# Patient Record
Sex: Female | Born: 1935 | Race: Black or African American | Hispanic: No | Marital: Single | Smoking: Never smoker
Health system: Southern US, Community
[De-identification: ages and names within clinical notes are randomized; demographics above are authoritative.]

## PROBLEM LIST (undated history)

## (undated) DIAGNOSIS — I10 Essential (primary) hypertension: Secondary | ICD-10-CM

## (undated) DIAGNOSIS — F0390 Unspecified dementia without behavioral disturbance: Secondary | ICD-10-CM

## (undated) DIAGNOSIS — E119 Type 2 diabetes mellitus without complications: Secondary | ICD-10-CM

## (undated) HISTORY — PX: ABDOMINAL HYSTERECTOMY: SHX81

## (undated) HISTORY — PX: SHOULDER SURGERY: SHX246

---

## 2005-11-15 ENCOUNTER — Ambulatory Visit: Payer: Self-pay | Admitting: Family Medicine

## 2006-05-19 ENCOUNTER — Emergency Department: Payer: Self-pay | Admitting: Emergency Medicine

## 2007-08-13 ENCOUNTER — Emergency Department: Payer: Self-pay | Admitting: Emergency Medicine

## 2007-08-19 ENCOUNTER — Other Ambulatory Visit: Payer: Self-pay

## 2007-08-19 ENCOUNTER — Emergency Department: Payer: Self-pay | Admitting: Emergency Medicine

## 2008-09-16 ENCOUNTER — Inpatient Hospital Stay: Payer: Self-pay | Admitting: Internal Medicine

## 2009-07-10 ENCOUNTER — Ambulatory Visit: Payer: Self-pay | Admitting: Family Medicine

## 2010-10-23 ENCOUNTER — Encounter: Payer: Self-pay | Admitting: Podiatry

## 2010-10-23 DIAGNOSIS — M1990 Unspecified osteoarthritis, unspecified site: Secondary | ICD-10-CM

## 2010-10-23 DIAGNOSIS — I10 Essential (primary) hypertension: Secondary | ICD-10-CM | POA: Insufficient documentation

## 2011-07-08 ENCOUNTER — Ambulatory Visit: Payer: Self-pay | Admitting: Vascular Surgery

## 2011-07-08 LAB — BASIC METABOLIC PANEL
Calcium, Total: 9.2 mg/dL (ref 8.5–10.1)
Chloride: 103 mmol/L (ref 98–107)
Co2: 27 mmol/L (ref 21–32)
Creatinine: 0.82 mg/dL (ref 0.60–1.30)
EGFR (Non-African Amer.): >60
Potassium: 3.8 mmol/L (ref 3.5–5.1)
Sodium: 138 mmol/L (ref 136–145)

## 2011-11-08 ENCOUNTER — Ambulatory Visit: Payer: Self-pay | Admitting: Vascular Surgery

## 2011-11-08 LAB — BASIC METABOLIC PANEL
Anion Gap: 9 (ref 7–16)
BUN: 12 mg/dL (ref 7–18)
Chloride: 102 mmol/L (ref 98–107)
Creatinine: 0.81 mg/dL (ref 0.60–1.30)
EGFR (African American): >60
EGFR (Non-African Amer.): >60
Glucose: 113 mg/dL — ABNORMAL HIGH (ref 65–99)
Potassium: 4.2 mmol/L (ref 3.5–5.1)
Sodium: 140 mmol/L (ref 136–145)

## 2011-12-27 ENCOUNTER — Ambulatory Visit: Payer: Self-pay | Admitting: Family Medicine

## 2012-02-07 ENCOUNTER — Ambulatory Visit: Payer: Self-pay | Admitting: Specialist

## 2012-05-03 ENCOUNTER — Ambulatory Visit: Payer: Self-pay | Admitting: Specialist

## 2012-05-03 LAB — BASIC METABOLIC PANEL
Anion Gap: 4 — ABNORMAL LOW (ref 7–16)
Calcium, Total: 9 mg/dL (ref 8.5–10.1)
Co2: 29 mmol/L (ref 21–32)
Creatinine: 0.83 mg/dL (ref 0.60–1.30)
Glucose: 96 mg/dL (ref 65–99)
Osmolality: 277 (ref 275–301)
Potassium: 3.8 mmol/L (ref 3.5–5.1)
Sodium: 138 mmol/L (ref 136–145)

## 2012-05-03 LAB — CBC WITH DIFFERENTIAL/PLATELET
Basophil %: 1.3 %
Eosinophil #: 0.1 10*3/uL (ref 0.0–0.7)
HCT: 33.6 % — ABNORMAL LOW (ref 35.0–47.0)
HGB: 10.8 g/dL — ABNORMAL LOW (ref 12.0–16.0)
Lymphocyte #: 2 10*3/uL (ref 1.0–3.6)
MCH: 24.5 pg — ABNORMAL LOW (ref 26.0–34.0)
MCHC: 32 g/dL (ref 32.0–36.0)
Monocyte #: 0.7 x10 3/mm (ref 0.2–0.9)
Neutrophil %: 47.4 %
Platelet: 208 10*3/uL (ref 150–440)
RBC: 4.39 10*6/uL (ref 3.80–5.20)
RDW: 14.6 % — ABNORMAL HIGH (ref 11.5–14.5)
WBC: 5.5 10*3/uL (ref 3.6–11.0)

## 2012-05-09 ENCOUNTER — Ambulatory Visit: Payer: Self-pay | Admitting: Internal Medicine

## 2012-05-10 ENCOUNTER — Ambulatory Visit: Payer: Self-pay | Admitting: Specialist

## 2012-12-19 ENCOUNTER — Ambulatory Visit: Payer: Self-pay | Admitting: Family Medicine

## 2013-01-22 ENCOUNTER — Ambulatory Visit: Payer: Self-pay | Admitting: Vascular Surgery

## 2013-01-22 LAB — BASIC METABOLIC PANEL
BUN: 25 mg/dL — ABNORMAL HIGH (ref 7–18)
Chloride: 99 mmol/L (ref 98–107)
Co2: 31 mmol/L (ref 21–32)
EGFR (African American): >60
Glucose: 117 mg/dL — ABNORMAL HIGH (ref 65–99)
Osmolality: 276 (ref 275–301)
Sodium: 135 mmol/L — ABNORMAL LOW (ref 136–145)

## 2013-02-25 ENCOUNTER — Inpatient Hospital Stay: Payer: Self-pay | Admitting: Student

## 2013-02-25 LAB — COMPREHENSIVE METABOLIC PANEL
Albumin: 4.1 g/dL (ref 3.4–5.0)
Anion Gap: 5 — ABNORMAL LOW (ref 7–16)
Bilirubin,Total: 0.6 mg/dL (ref 0.2–1.0)
Calcium, Total: 9.3 mg/dL (ref 8.5–10.1)
Chloride: 100 mmol/L (ref 98–107)
Co2: 30 mmol/L (ref 21–32)
EGFR (African American): 50 — ABNORMAL LOW
EGFR (Non-African Amer.): 43 — ABNORMAL LOW
Glucose: 150 mg/dL — ABNORMAL HIGH (ref 65–99)
Potassium: 3.2 mmol/L — ABNORMAL LOW (ref 3.5–5.1)
SGOT(AST): 25 U/L (ref 15–37)
Sodium: 135 mmol/L — ABNORMAL LOW (ref 136–145)
Total Protein: 7.9 g/dL (ref 6.4–8.2)

## 2013-02-25 LAB — URINALYSIS, COMPLETE
Glucose,UR: 150 mg/dL (ref 0–75)
Hyaline Cast: 2
Ketone: NEGATIVE
Nitrite: POSITIVE
Ph: 5 (ref 4.5–8.0)
Protein: 30
RBC,UR: 5 /HPF (ref 0–5)
Squamous Epithelial: <1

## 2013-02-25 LAB — CK TOTAL AND CKMB (NOT AT ARMC): CK-MB: 2.1 ng/mL (ref 0.5–3.6)

## 2013-02-25 LAB — CBC
HCT: 34.1 % — ABNORMAL LOW (ref 35.0–47.0)
HGB: 11.2 g/dL — ABNORMAL LOW (ref 12.0–16.0)
MCH: 24.6 pg — ABNORMAL LOW (ref 26.0–34.0)
RDW: 14.5 % (ref 11.5–14.5)
WBC: 5.4 10*3/uL (ref 3.6–11.0)

## 2013-02-25 LAB — TROPONIN I: Troponin-I: <0.02 ng/mL

## 2013-02-26 LAB — BASIC METABOLIC PANEL
Anion Gap: 7 (ref 7–16)
BUN: 13 mg/dL (ref 7–18)
Chloride: 102 mmol/L (ref 98–107)
EGFR (African American): >60
EGFR (Non-African Amer.): 57 — ABNORMAL LOW
Glucose: 113 mg/dL — ABNORMAL HIGH (ref 65–99)
Osmolality: 271 (ref 275–301)
Potassium: 3.4 mmol/L — ABNORMAL LOW (ref 3.5–5.1)
Sodium: 135 mmol/L — ABNORMAL LOW (ref 136–145)

## 2013-02-26 LAB — CBC WITH DIFFERENTIAL/PLATELET
Basophil %: 1.3 %
Eosinophil #: 0 10*3/uL (ref 0.0–0.7)
Eosinophil %: 0.6 %
HGB: 10.9 g/dL — ABNORMAL LOW (ref 12.0–16.0)
Lymphocyte #: 1.9 10*3/uL (ref 1.0–3.6)
Lymphocyte %: 33.3 %
MCH: 24.6 pg — ABNORMAL LOW (ref 26.0–34.0)
Neutrophil #: 2.9 10*3/uL (ref 1.4–6.5)
Neutrophil %: 51.3 %
Platelet: 199 10*3/uL (ref 150–440)
RDW: 14.8 % — ABNORMAL HIGH (ref 11.5–14.5)

## 2013-02-26 LAB — MAGNESIUM: Magnesium: 1.3 mg/dL — ABNORMAL LOW

## 2013-04-29 ENCOUNTER — Emergency Department (HOSPITAL_COMMUNITY): Payer: Medicare Other

## 2013-04-29 ENCOUNTER — Emergency Department (HOSPITAL_COMMUNITY)
Admission: EM | Admit: 2013-04-29 | Discharge: 2013-04-29 | Disposition: A | Discharge status: Nursing Home (Includes ICF) | Payer: Medicare Other | Source: Home / Self Care | Attending: Family Medicine | Admitting: Family Medicine

## 2013-04-29 ENCOUNTER — Emergency Department (HOSPITAL_COMMUNITY)
Admission: EM | Admit: 2013-04-29 | Discharge: 2013-04-29 | Disposition: A | Discharge status: Home / Self Care | Payer: Medicare Other | Attending: Emergency Medicine | Admitting: Emergency Medicine

## 2013-04-29 ENCOUNTER — Encounter (HOSPITAL_COMMUNITY): Payer: Self-pay | Admitting: Emergency Medicine

## 2013-04-29 DIAGNOSIS — I10 Essential (primary) hypertension: Secondary | ICD-10-CM | POA: Insufficient documentation

## 2013-04-29 DIAGNOSIS — E119 Type 2 diabetes mellitus without complications: Secondary | ICD-10-CM | POA: Insufficient documentation

## 2013-04-29 DIAGNOSIS — F0390 Unspecified dementia without behavioral disturbance: Secondary | ICD-10-CM | POA: Insufficient documentation

## 2013-04-29 DIAGNOSIS — R5381 Other malaise: Secondary | ICD-10-CM

## 2013-04-29 DIAGNOSIS — F29 Unspecified psychosis not due to a substance or known physiological condition: Secondary | ICD-10-CM

## 2013-04-29 DIAGNOSIS — R410 Disorientation, unspecified: Secondary | ICD-10-CM

## 2013-04-29 DIAGNOSIS — E876 Hypokalemia: Secondary | ICD-10-CM | POA: Insufficient documentation

## 2013-04-29 DIAGNOSIS — R531 Weakness: Secondary | ICD-10-CM

## 2013-04-29 DIAGNOSIS — R4182 Altered mental status, unspecified: Secondary | ICD-10-CM | POA: Insufficient documentation

## 2013-04-29 DIAGNOSIS — R197 Diarrhea, unspecified: Secondary | ICD-10-CM | POA: Insufficient documentation

## 2013-04-29 DIAGNOSIS — Z79899 Other long term (current) drug therapy: Secondary | ICD-10-CM | POA: Insufficient documentation

## 2013-04-29 DIAGNOSIS — Z7901 Long term (current) use of anticoagulants: Secondary | ICD-10-CM | POA: Insufficient documentation

## 2013-04-29 HISTORY — DX: Essential (primary) hypertension: I10

## 2013-04-29 HISTORY — DX: Unspecified dementia, unspecified severity, without behavioral disturbance, psychotic disturbance, mood disturbance, and anxiety: F03.90

## 2013-04-29 HISTORY — DX: Type 2 diabetes mellitus without complications: E11.9

## 2013-04-29 LAB — COMPREHENSIVE METABOLIC PANEL
AST: 23 U/L (ref 0–37)
Albumin: 4.2 g/dL (ref 3.5–5.2)
Alkaline Phosphatase: 62 U/L (ref 39–117)
BUN: 20 mg/dL (ref 6–23)
CO2: 29 mEq/L (ref 19–32)
Calcium: 9.7 mg/dL (ref 8.4–10.5)
Chloride: 93 mEq/L — ABNORMAL LOW (ref 96–112)
Creatinine, Ser: 1.23 mg/dL — ABNORMAL HIGH (ref 0.50–1.10)
GFR calc Af Amer: 46 mL/min — ABNORMAL LOW (ref >90)
GFR calc non Af Amer: 40 mL/min — ABNORMAL LOW (ref >90)
Total Bilirubin: 0.4 mg/dL (ref 0.3–1.2)
Total Protein: 7.9 g/dL (ref 6.0–8.3)

## 2013-04-29 LAB — CBC
HCT: 34.5 % — ABNORMAL LOW (ref 36.0–46.0)
MCH: 24.9 pg — ABNORMAL LOW (ref 26.0–34.0)
MCHC: 33 g/dL (ref 30.0–36.0)
MCV: 75.5 fL — ABNORMAL LOW (ref 78.0–100.0)
Platelets: 255 10*3/uL (ref 150–400)
RBC: 4.57 MIL/uL (ref 3.87–5.11)
RDW: 13.8 % (ref 11.5–15.5)
WBC: 6.5 10*3/uL (ref 4.0–10.5)

## 2013-04-29 LAB — URINALYSIS, ROUTINE W REFLEX MICROSCOPIC
Glucose, UA: NEGATIVE mg/dL
Ketones, ur: 15 mg/dL — AB
Leukocytes, UA: NEGATIVE
Nitrite: NEGATIVE
Protein, ur: 100 mg/dL — AB
Specific Gravity, Urine: 1.018 (ref 1.005–1.030)
Urobilinogen, UA: 1 mg/dL (ref 0.0–1.0)

## 2013-04-29 LAB — POCT URINALYSIS DIP (DEVICE)
Nitrite: NEGATIVE
Specific Gravity, Urine: 1.025 (ref 1.005–1.030)
Urobilinogen, UA: 2 mg/dL — ABNORMAL HIGH (ref 0.0–1.0)
pH: 6.5 (ref 5.0–8.0)

## 2013-04-29 LAB — URINE MICROSCOPIC-ADD ON

## 2013-04-29 MED ORDER — POTASSIUM CHLORIDE CRYS ER 20 MEQ PO TBCR
40.0000 meq | EXTENDED_RELEASE_TABLET | Freq: Once | ORAL | Status: AC
  Administered 2013-04-29: 40 meq via ORAL
  Filled 2013-04-29: qty 2

## 2013-04-29 MED ORDER — SODIUM CHLORIDE 0.9 % IV BOLUS (SEPSIS)
1000.0000 mL | Freq: Once | INTRAVENOUS | Status: AC
  Administered 2013-04-29: 1000 mL via INTRAVENOUS

## 2013-04-29 MED ORDER — LORAZEPAM 2 MG/ML IJ SOLN
1.0000 mg | Freq: Once | INTRAMUSCULAR | Status: AC
  Administered 2013-04-29: 1 mg via INTRAVENOUS
  Filled 2013-04-29: qty 1

## 2013-04-29 MED ORDER — POTASSIUM CHLORIDE 10 MEQ/100ML IV SOLN
10.0000 meq | Freq: Once | INTRAVENOUS | Status: AC
  Administered 2013-04-29: 10 meq via INTRAVENOUS
  Filled 2013-04-29: qty 100

## 2013-04-29 NOTE — ED Provider Notes (Signed)
CSN: [id number]     Arrival date & time 04/29/13  1004 History   First MD Initiated Contact with Patient 04/29/13 1037     Chief Complaint  Patient presents with  . Urinary Tract Infection   ) Patient is a 79 y.o. female presenting with altered mental status. The history is provided by a relative and the patient.  Altered Mental Status Presenting symptoms: behavior changes, confusion, disorientation, lethargy and memory loss   Presenting symptoms: no combativeness, no partial responsiveness and no unresponsiveness   Severity:  Moderate Most recent episode:  Today Episode history:  Continuous Duration:  3 days Timing:  Constant Progression:  Worsening Chronicity:  New Context: dementia   Context: not a recent illness and not a recent infection   Associated symptoms: decreased appetite and weakness   Associated symptoms: no agitation and no hallucinations   Ms Ochsner is and 79 y/o AA female w/ past medical h/o HTN and arthritis brought in by daughter and grand daughter for increasing weakness and confusion over the last 3 days. They report h/o similar symptoms back in September w/ UTI. Pt has slept a lot since onset of symptoms in fact did not get OOB yesterday at all. She is unsteady on her feet and has not been eating or drinking. No known frequency or c/o pain w/ urination. Pt normally has a great appetite and is oriented x 3 and still managing her own affairs until this past Friday (though family does report a slow decline since illness in September). Family also reports a recent diagnosis of early dementia (by PCP) though they report they have never seen symptoms. Pt was working and driving until recently.  In and out cathed for u/a which does not indicate UTI. On exam she has a blunted affect and is oriented x 2 but very slow to respond. She at times will follow simple commands but at other times does not. Otherwise no other focal findings. Felt pt would benefit from further work-up for  weakness and confusion. Will transfer to [facility] via shuttle.   Past Medical History  Diagnosis Date  . Diabetes mellitus without complication   . Hypertension   . Dementia    History reviewed. No pertinent past surgical history. No family history on file. History  Substance Use Topics  . Smoking status: Never Smoker   . Smokeless tobacco: Not on file  . Alcohol Use: No   OB History   Grav Para Term Preterm Abortions TAB SAB Ect Mult Living                 Review of Systems  Constitutional: Positive for activity change, appetite change and decreased appetite. Negative for chills.  HENT: Negative.   Eyes: Negative.   Respiratory: Negative for cough.   Cardiovascular: Negative.   Gastrointestinal: Negative.   Endocrine: Negative.   Genitourinary: Negative.   Musculoskeletal: Negative.   Skin: Negative.   Allergic/Immunologic: Negative.   Neurological: Positive for weakness. Negative for facial asymmetry and speech difficulty.  Hematological: Negative.   Psychiatric/Behavioral: Positive for memory loss and confusion. Negative for hallucinations and agitation.    Allergies  Review of patient's allergies indicates no known allergies.  Home Medications   Current Outpatient Rx  Name  Route  Sig  Dispense  Refill  . amLODipine (NORVASC) 5 MG tablet   Oral   Take 5 mg by mouth daily.         . chlorthalidone (HYGROTON) 25 MG tablet   Oral  Take 25 mg by mouth daily.         . clopidogrel (PLAVIX) 75 MG tablet   Oral   Take 75 mg by mouth daily with breakfast.         . donepezil (ARICEPT) 10 MG tablet   Oral   Take 10 mg by mouth at bedtime.         . gabapentin (NEURONTIN) 400 MG capsule   Oral   Take 400 mg by mouth 3 (three) times daily.         Marland Kitchen glimepiride (AMARYL) 2 MG tablet   Oral   Take 2 mg by mouth daily with breakfast.         . lisinopril (PRINIVIL,ZESTRIL) 20 MG tablet   Oral   Take 20 mg by mouth daily.         . meloxicam  (MOBIC) 15 MG tablet   Oral   Take 15 mg by mouth daily.         Marland Kitchen omeprazole (PRILOSEC) 40 MG capsule   Oral   Take 40 mg by mouth daily.         . rosuvastatin (CRESTOR) 40 MG tablet   Oral   Take 40 mg by mouth daily.          BP 125/73  Pulse 77  Temp(Src) 97.3 F (36.3 C) (Oral)  Resp 16  SpO2 100% Physical Exam  Constitutional: She appears well-developed and well-nourished.  HENT:  Head: Normocephalic and atraumatic.  Eyes: Conjunctivae are normal. Pupils are equal, round, and reactive to light.  Unable to fuuly assess neuro d/t pt unable to follow commands at times.  Cardiovascular: Normal rate and regular rhythm.   Pulmonary/Chest: Effort normal and breath sounds normal.  Musculoskeletal: Normal range of motion.  Neurological: She is alert. GCS eye subscore is 4. GCS verbal subscore is 5. GCS motor subscore is 6.  Oriented x 2 (person and DOB)    ED Course  Procedures (including critical care time) Labs Review Labs Reviewed  POCT URINALYSIS DIP (DEVICE) - Abnormal; Notable for the following:    Bilirubin Urine SMALL (*)    Hgb urine dipstick TRACE (*)    Protein, ur 100 (*)    Urobilinogen, UA 2.0 (*)    All other components within normal limits   Imaging Review No results found.  EKG Interpretation    Date/Time:    Ventricular Rate:    PR Interval:    QRS Duration:   QT Interval:    QTC Calculation:   R Axis:     Text Interpretation:              MDM   1. Weakness   2. Confusion    3 day h/o weakness and confusion. Similar symptoms in 01/2013 w/ UTI.  In and out cathed for u/a which does not indicate UTI. On exam she has a blunted affect and is oriented x 2 but very slow to respond. She at times will follow simple commands but at other times does not. Otherwise no other focal findings. Pt usually quite independent and was working and driving until recently.  Felt pt would benefit from further work-up for weakness and confusion. Will  transfer to [facility] via shuttle. I have discussed pt w/ Dr Artis Flock who is in agreement w/ plan.      Leanne Chang, NP 04/29/13 1201

## 2013-04-29 NOTE — ED Notes (Signed)
Patient transported to MRI 

## 2013-04-29 NOTE — ED Notes (Addendum)
79 yr old is here today with her daughter and grand daughter with complaints of confusion x 2 dys. Her grand daughter states that the pt has HX of UTI -  She presents like this, confusion and not eating a lot. She states the pt has not been drinking a lot of fluids either. Last DX UTI in Sept 2014. Grand daughter states the pt has had some diarrhea and no vomiting.

## 2013-04-29 NOTE — ED Notes (Signed)
Patient returned from CT

## 2013-04-29 NOTE — ED Notes (Signed)
Patient returned from MRI.

## 2013-04-29 NOTE — ED Notes (Addendum)
Patient transported to CT 

## 2013-04-29 NOTE — ED Provider Notes (Signed)
Report received at beginning of shift.  Pt presents with AMS.  Normal head CT.  Currently awaits brain MRI.  If neg, can be d/c.    6:11 PM MRI tech called to notify that pt does not stay still in MRI machine.  Will give ativan 1mg  IV.    7:38 PM Brain MRI shows no acute finding. Patient has received her potassium supplementation. Patient currently able to tolerates by mouth. Family member is at bedside and will take patient home. Patient will followup with her PCP for labs recheck and further management. Return precautions discussed.  BP 126/69  Pulse 80  Temp(Src) 98 F (36.7 C) (Oral)  Resp 16  SpO2 99%  I have reviewed nursing notes and vital signs. I personally reviewed the imaging tests through PACS system  I reviewed available ER/hospitalization records thought the EMR  Results for orders placed during the hospital encounter of 04/29/13  CBC      Result Value Range   WBC 6.5  4.0 - 10.5 K/uL   RBC 4.57  3.87 - 5.11 MIL/uL   Hemoglobin 11.4 (*) 12.0 - 15.0 g/dL   HCT 16.1 (*) 09.6 - 04.5 %   MCV 75.5 (*) 78.0 - 100.0 fL   MCH 24.9 (*) 26.0 - 34.0 pg   MCHC 33.0  30.0 - 36.0 g/dL   RDW 40.9  81.1 - 91.4 %   Platelets 255  150 - 400 K/uL  COMPREHENSIVE METABOLIC PANEL      Result Value Range   Sodium 134 (*) 135 - 145 mEq/L   Potassium 3.0 (*) 3.5 - 5.1 mEq/L   Chloride 93 (*) 96 - 112 mEq/L   CO2 29  19 - 32 mEq/L   Glucose, Bld 126 (*) 70 - 99 mg/dL   BUN 20  6 - 23 mg/dL   Creatinine, Ser 7.82 (*) 0.50 - 1.10 mg/dL   Calcium 9.7  8.4 - 95.6 mg/dL   Total Protein 7.9  6.0 - 8.3 g/dL   Albumin 4.2  3.5 - 5.2 g/dL   AST 23  0 - 37 U/L   ALT 17  0 - 35 U/L   Alkaline Phosphatase 62  39 - 117 U/L   Total Bilirubin 0.4  0.3 - 1.2 mg/dL   GFR calc non Af Amer 40 (*) >90 mL/min   GFR calc Af Amer 46 (*) >90 mL/min  URINALYSIS, ROUTINE W REFLEX MICROSCOPIC      Result Value Range   Color, Urine YELLOW  YELLOW   APPearance CLEAR  CLEAR   Specific Gravity, Urine 1.018   1.005 - 1.030   pH 5.5  5.0 - 8.0   Glucose, UA NEGATIVE  NEGATIVE mg/dL   Hgb urine dipstick SMALL (*) NEGATIVE   Bilirubin Urine SMALL (*) NEGATIVE   Ketones, ur 15 (*) NEGATIVE mg/dL   Protein, ur 213 (*) NEGATIVE mg/dL   Urobilinogen, UA 1.0  0.0 - 1.0 mg/dL   Nitrite NEGATIVE  NEGATIVE   Leukocytes, UA NEGATIVE  NEGATIVE  URINE MICROSCOPIC-ADD ON      Result Value Range   Squamous Epithelial / LPF RARE  RARE   WBC, UA 0-2  <3 WBC/hpf   RBC / HPF 0-2  <3 RBC/hpf   Bacteria, UA FEW (*) RARE   Casts HYALINE CASTS (*) NEGATIVE   Ct Head Wo Contrast  04/29/2013   CLINICAL DATA:  Confusion.  EXAM: CT HEAD WITHOUT CONTRAST  TECHNIQUE: Contiguous axial images were obtained from  the base of the skull through the vertex without intravenous contrast.  COMPARISON:  None.  FINDINGS: No mass. No hydrocephalus. No hemorrhage. Orbits are unremarkable. No acute bony abnormality. Paranasal sinuses are clear. Mastoids are clear  IMPRESSION: Negative exam.   Electronically Signed   By: Maisie Fus  Register   On: 04/29/2013 13:48   Mr Brain Wo Contrast  04/29/2013   CLINICAL DATA:  79 year old female with altered mental status and confusion. Initial encounter.  EXAM: MRI HEAD WITHOUT CONTRAST  TECHNIQUE: Multiplanar, multiecho pulse sequences of the brain and surrounding structures were obtained without intravenous contrast.  COMPARISON:  Head CT 04/29/2013.  FINDINGS: Study is intermittently degraded by motion artifact despite repeated imaging attempts.  Mildly heterogeneous diffusion weighted imaging, but no restricted diffusion or evidence of acute infarction. Major intracranial vascular flow voids are preserved.  No ventriculomegaly. No midline shift, mass effect, or evidence of intracranial mass lesion. No acute intracranial hemorrhage identified. No extra-axial collection. Basilar cisterns are patent. Grossly normal pituitary and cervicomedullary junction. Minimal cerebral white matter T2 and FLAIR  hyperintensity, mostly periventricular. No cortical encephalomalacia. There may be a tiny chronic lacunar infarct at the caudothalamic notch. Brainstem and cerebellum appear normal.  Visualized orbit soft tissues are within normal limits. Visualized paranasal sinuses and mastoids are clear. Normal bone marrow signal. Grossly negative scalp soft tissues.  IMPRESSION: Intermittently degraded by motion. No acute intracranial abnormality identified.  Mild for age nonspecific signal changes, most commonly due to chronic small vessel disease.   Electronically Signed   By: Augusto Gamble M.D.   On: 04/29/2013 19:30   Dg Chest Port 1 View  04/29/2013   CLINICAL DATA:  Confusion.  Chest pain.  EXAM: PORTABLE CHEST - 1 VIEW  COMPARISON:  None.  FINDINGS: Heart size appears mildly prominent for portable technique; heart size is best assessed with two-view technique. There is atherosclerotic calcification of the thoracic aorta. Lung volumes are slightly low. There is mild bilateral peribronchial thickening, of uncertain chronicity. There is streaky atelectasis at the left lung base. No definite airspace disease visualized. No pleural effusion are seen. Negative for pneumothorax. The bones appear diffusely osteopenic.  IMPRESSION: 1. Low lung volumes. Peribronchial thickening bilaterally is of uncertain chronicity. This can be seen in the setting of the acute or chronic bronchitis, smoking, asthma, or chronic interstitial abnormality. 2. Streaky atelectasis left lung base.   Electronically Signed   By: Britta Mccreedy M.D.   On: 04/29/2013 14:20    Medications  sodium chloride 0.9 % bolus 1,000 mL (0 mLs Intravenous Stopped 04/29/13 1730)  potassium chloride 10 mEq in 100 mL IVPB (0 mEq Intravenous Stopped 04/29/13 1730)  potassium chloride SA (K-DUR,KLOR-CON) CR tablet 40 mEq (40 mEq Oral Given 04/29/13 1705)  LORazepam (ATIVAN) injection 1 mg (1 mg Intravenous Given 04/29/13 1820)     Fayrene Helper, PA-C 04/29/13 1942

## 2013-04-29 NOTE — ED Notes (Addendum)
Asked patient if she could urinate. Family said at Urgent Care this morning she would not urinate and they did an In and Out Cath to obtain urine. They told family there was no UTI based on the specimen they obtained. Family asked did she have to have urine checked again. Notifying Anitra Lauth, MD at this time.  Discussed with PA; do another I&O cath to send urine to lab for cultures. Discussed with family. Ok to proceed.

## 2013-04-29 NOTE — ED Notes (Signed)
PT sent from [facility] for further workup of altered LOC. UA complete and was normal at [facility]. Family states pt with poor oral intake, fatigue and 'not acting herself' since friday

## 2013-04-29 NOTE — ED Notes (Signed)
PA at bedside.

## 2013-04-29 NOTE — ED Provider Notes (Signed)
CSN: [id number]     Arrival date & time 04/29/13  1217 History   First MD Initiated Contact with Patient 04/29/13 1245     Chief Complaint  Patient presents with  . Altered Mental Status    HPI  Margaret Hernandez is a 79 y.o. female with a PMH of DM, HTN, and dementia who presents to the ED for evaluation of AMS.  History was provided by the patient and her daughter.  Patient alert but not oriented to place (doesn't know where she is). When asked who is in the room with her she states "my daughter" (appropriate). When asked what year it is she says Thanksgiving. Patient appears confused. She is able to answer all questions however does not answer appropriately on occasion. She is responsive to all commands however does not appropriately follow some commands.  Patient has "not been herself" for the past 3 days per daughter.  She's had decreased appetite and activity.  She has been confused as well. Patient lives with her daughter who recently moved in with her mother. Patient had a urinary tract infection in the past with similar symptoms today. She was taken to an urgent care for further evaluation and management of a possible UTI. A urine was done at that time which is not highly suggestive of urinary tract infection. She was taken to the ER for further evaluation and management. Patient currently has no complaints including headache, chest pain, abdominal pain or nausea. She is resting comfortably. Daughter denies any recent fevers, coughing or complaints of pain. She states she had a few episodes of diarrhea a few days ago however this has resolved.  There has been some concern for a developing dementia the past few months, but her symptoms the past few days are an acute change for her (per daughter).      Past Medical History  Diagnosis Date  . Diabetes mellitus without complication   . Hypertension   . Dementia    History reviewed. No pertinent past surgical history. History reviewed. No  pertinent family history. History  Substance Use Topics  . Smoking status: Never Smoker   . Smokeless tobacco: Not on file  . Alcohol Use: No   OB History   Grav Para Term Preterm Abortions TAB SAB Ect Mult Living                 Review of Systems  Constitutional: Positive for activity change and appetite change. Negative for fever, chills, diaphoresis, fatigue and unexpected weight change.  HENT: Negative for congestion, rhinorrhea and sore throat.   Eyes: Negative for visual disturbance.  Respiratory: Negative for cough and shortness of breath.   Cardiovascular: Negative for chest pain and leg swelling.  Gastrointestinal: Positive for diarrhea. Negative for nausea, vomiting, abdominal pain and constipation.  Genitourinary: Negative for dysuria and difficulty urinating.  Musculoskeletal: Negative for back pain and neck pain.  Skin: Negative for color change and wound.  Neurological: Negative for dizziness, syncope, facial asymmetry, speech difficulty, weakness, numbness and headaches.  Psychiatric/Behavioral: Positive for confusion.    Allergies  Review of patient's allergies indicates no known allergies.  Home Medications   Current Outpatient Rx  Name  Route  Sig  Dispense  Refill  . amLODipine (NORVASC) 5 MG tablet   Oral   Take 5 mg by mouth daily.         . chlorthalidone (HYGROTON) 25 MG tablet   Oral   Take 25 mg by mouth daily.         Marland Kitchen  clopidogrel (PLAVIX) 75 MG tablet   Oral   Take 75 mg by mouth daily with breakfast.         . donepezil (ARICEPT) 10 MG tablet   Oral   Take 10 mg by mouth at bedtime.         . gabapentin (NEURONTIN) 400 MG capsule   Oral   Take 400 mg by mouth 3 (three) times daily.         Marland Kitchen glimepiride (AMARYL) 2 MG tablet   Oral   Take 2 mg by mouth daily with breakfast.         . lisinopril (PRINIVIL,ZESTRIL) 20 MG tablet   Oral   Take 20 mg by mouth daily.         . meloxicam (MOBIC) 15 MG tablet   Oral    Take 15 mg by mouth daily.         Marland Kitchen omeprazole (PRILOSEC) 40 MG capsule   Oral   Take 40 mg by mouth daily.         . rosuvastatin (CRESTOR) 40 MG tablet   Oral   Take 40 mg by mouth daily.          BP 119/58  Pulse 76  Temp(Src) 98 F (36.7 C) (Oral)  Resp 16  SpO2 99%  Filed Vitals:   04/29/13 1700 04/29/13 1904 04/29/13 1930 04/29/13 2042  BP: 152/60 126/69 129/68 108/74  Pulse: 79 80 80 87  Temp:    97.7 F (36.5 C)  TempSrc:    Oral  Resp: 16 16  18   SpO2: 98% 99% 99% 100%    Physical Exam  Nursing note and vitals reviewed. Constitutional: She appears well-developed and well-nourished. No distress.  Poor eye contact.  Flat affect.    HENT:  Head: Normocephalic and atraumatic.  Right Ear: External ear normal.  Left Ear: External ear normal.  Nose: Nose normal.  Mouth/Throat: Oropharynx is clear and moist.  Eyes: Conjunctivae are normal. Pupils are equal, round, and reactive to light. Right eye exhibits no discharge. Left eye exhibits no discharge.  Unable to fully assess EOM.  Patient not following commands.    Neck: Normal range of motion. Neck supple.  Cardiovascular: Normal rate, regular rhythm, normal heart sounds and intact distal pulses.  Exam reveals no gallop and no friction rub.   No murmur heard. Dorsalis pedis pulses present and equal bilaterally  Pulmonary/Chest: Effort normal and breath sounds normal. No respiratory distress. She has no wheezes. She has no rales. She exhibits no tenderness.  Abdominal: Soft. Bowel sounds are normal. She exhibits no distension and no mass. There is no tenderness. There is no rebound and no guarding.  Musculoskeletal: Normal range of motion. She exhibits no edema and no tenderness.  Grip strength 5/5 bilaterally.  Dorsiflexion and plantarflexion intact bilaterally.  Patient able to flex hips and knees bilaterally.    Neurological: She is alert.  GCS 15.  Patient alert.  Oriented to person not place/time.  Unable  to fully assess neuro exam due to inability to follow some commands.    Skin: Skin is warm and dry. She is not diaphoretic.    ED Course  Procedures (including critical care time) Labs Review Labs Reviewed  CBC - Abnormal; Notable for the following:    Hemoglobin 11.4 (*)    HCT 34.5 (*)    MCV 75.5 (*)    MCH 24.9 (*)    All other components within normal limits  COMPREHENSIVE METABOLIC PANEL  URINALYSIS, ROUTINE W REFLEX MICROSCOPIC   Imaging Review No results found.  EKG Interpretation    Date/Time:  Sunday April 29 2013 13:05:24 EST Ventricular Rate:  75 PR Interval:  166 QRS Duration: 106 QT Interval:  402 QTC Calculation: 449 R Axis:   -38 Text Interpretation:  Sinus rhythm Atrial premature complex Abnormal R-wave progression, late transition LVH with secondary repolarization abnormality No previous tracing Confirmed by Anitra Lauth  MD, WHITNEY ([id number]) on 04/29/2013 1:15:49 PM           DG Chest Port 1 View (Final result)  Result time: 04/29/13 14:20:16    Final result by Rad Results In Interface (04/29/13 14:20:16)    Narrative:   CLINICAL DATA: Confusion. Chest pain.  EXAM: PORTABLE CHEST - 1 VIEW  COMPARISON: None.  FINDINGS: Heart size appears mildly prominent for portable technique; heart size is best assessed with two-view technique. There is atherosclerotic calcification of the thoracic aorta. Lung volumes are slightly low. There is mild bilateral peribronchial thickening, of uncertain chronicity. There is streaky atelectasis at the left lung base. No definite airspace disease visualized. No pleural effusion are seen. Negative for pneumothorax. The bones appear diffusely osteopenic.  IMPRESSION: 1. Low lung volumes. Peribronchial thickening bilaterally is of uncertain chronicity. This can be seen in the setting of the acute or chronic bronchitis, smoking, asthma, or chronic interstitial abnormality. 2. Streaky atelectasis left lung  base.   Electronically Signed By: Britta Mccreedy M.D. On: 04/29/2013 14:20             CT Head Wo Contrast (Final result)  Result time: 04/29/13 13:48:53    Final result by Rad Results In Interface (04/29/13 13:48:53)    Narrative:   CLINICAL DATA: Confusion.  EXAM: CT HEAD WITHOUT CONTRAST  TECHNIQUE: Contiguous axial images were obtained from the base of the skull through the vertex without intravenous contrast.  COMPARISON: None.  FINDINGS: No mass. No hydrocephalus. No hemorrhage. Orbits are unremarkable. No acute bony abnormality. Paranasal sinuses are clear. Mastoids are clear  IMPRESSION: Negative exam.   Electronically Signed By: Maisie Fus Register On: 04/29/2013 13:48           Results for orders placed during the hospital encounter of 04/29/13  URINE CULTURE      Result Value Range   Specimen Description URINE, CATHETERIZED     Special Requests NONE     Culture  Setup Time       Value: 04/30/2013 01:33     Performed at [facility]   Colony Count       Value: NO GROWTH     Performed at [facility]   Culture       Value: NO GROWTH     Performed at [facility]   Report Status 05/01/2013 FINAL    CBC      Result Value Range   WBC 6.5  4.0 - 10.5 K/uL   RBC 4.57  3.87 - 5.11 MIL/uL   Hemoglobin 11.4 (*) 12.0 - 15.0 g/dL   HCT 40.9 (*) 81.1 - 91.4 %   MCV 75.5 (*) 78.0 - 100.0 fL   MCH 24.9 (*) 26.0 - 34.0 pg   MCHC 33.0  30.0 - 36.0 g/dL   RDW 78.2  95.6 - 21.3 %   Platelets 255  150 - 400 K/uL  COMPREHENSIVE METABOLIC PANEL      Result Value Range   Sodium 134 (*) 135 - 145 mEq/L  Potassium 3.0 (*) 3.5 - 5.1 mEq/L   Chloride 93 (*) 96 - 112 mEq/L   CO2 29  19 - 32 mEq/L   Glucose, Bld 126 (*) 70 - 99 mg/dL   BUN 20  6 - 23 mg/dL   Creatinine, Ser 1.61 (*) 0.50 - 1.10 mg/dL   Calcium 9.7  8.4 - 09.6 mg/dL   Total Protein 7.9  6.0 - 8.3 g/dL   Albumin 4.2  3.5 - 5.2 g/dL   AST 23  0 - 37 U/L   ALT 17  0 -  35 U/L   Alkaline Phosphatase 62  39 - 117 U/L   Total Bilirubin 0.4  0.3 - 1.2 mg/dL   GFR calc non Af Amer 40 (*) >90 mL/min   GFR calc Af Amer 46 (*) >90 mL/min  URINALYSIS, ROUTINE W REFLEX MICROSCOPIC      Result Value Range   Color, Urine YELLOW  YELLOW   APPearance CLEAR  CLEAR   Specific Gravity, Urine 1.018  1.005 - 1.030   pH 5.5  5.0 - 8.0   Glucose, UA NEGATIVE  NEGATIVE mg/dL   Hgb urine dipstick SMALL (*) NEGATIVE   Bilirubin Urine SMALL (*) NEGATIVE   Ketones, ur 15 (*) NEGATIVE mg/dL   Protein, ur 045 (*) NEGATIVE mg/dL   Urobilinogen, UA 1.0  0.0 - 1.0 mg/dL   Nitrite NEGATIVE  NEGATIVE   Leukocytes, UA NEGATIVE  NEGATIVE  URINE MICROSCOPIC-ADD ON      Result Value Range   Squamous Epithelial / LPF RARE  RARE   WBC, UA 0-2  <3 WBC/hpf   RBC / HPF 0-2  <3 RBC/hpf   Bacteria, UA FEW (*) RARE   Casts HYALINE CASTS (*) NEGATIVE    MDM   Marrisa Kimber is a 79 y.o. female with a PMH of DM, HTN, and dementia who presents to the ED for evaluation of AMS.  Head CT, chest x-ray, UA, CBC, and CMP ordered to further evaluate.    Rechecks  4:00 PM = Dr. Anitra Lauth spoke with family.  Daughter reports some ataxia at home.  MRI brain ordered.  If MRI negative daughter in agreement with discharge home and follow-up with PCP.  IV fluids and potassium will be given for hypokalemia and dehydration.     Patient evaluated in the ED for AMS.  Patient has a hx of dementia with an acute change/worsening of her condition the past 3 days.  Urine negative for UTI.  Head CT negative for an acute intracranial process.  EKG negative for any acute ischemic changes.  Chest x-ray negative for any acute cardiopulmonary process.  Patient had no complaints/asymptomatic.  Will order MRI to further evaluate.  Patient had mild hypokalemia and was supplemented in the ED.  Likely discharge home if MRI negative.     4:45 PM = Signed out care to Fayrene Helper PA-C to await results of MRI.     Discharge  Medication List as of 04/29/2013  7:43 PM      Final impressions: 1. Altered mental status   2. Hypokalemia      Luiz Iron PA-C   This patient was discussed with Dr. Mingo Amber, PA-C 05/01/13 [phone]

## 2013-04-30 NOTE — ED Provider Notes (Signed)
Medical screening examination/treatment/procedure(s) were performed by non-physician practitioner and as supervising physician I was immediately available for consultation/collaboration.  EKG Interpretation    Date/Time:  Sunday April 29 2013 13:05:24 EST Ventricular Rate:  75 PR Interval:  166 QRS Duration: 106 QT Interval:  402 QTC Calculation: 449 R Axis:   -38 Text Interpretation:  Sinus rhythm Atrial premature complex Abnormal R-wave progression, late transition LVH with secondary repolarization abnormality No previous tracing Confirmed by Anitra Lauth  MD, WHITNEY ([id number]) on 04/29/2013 1:15:49 PM             Juliet Rude. Rubin Payor, MD 04/30/13 0005

## 2013-05-01 LAB — URINE CULTURE: Culture: NO GROWTH

## 2013-05-02 NOTE — ED Provider Notes (Signed)
Medical screening examination/treatment/procedure(s) were conducted as a shared visit with non-physician practitioner(s) and myself.  I personally evaluated the patient during the encounter.  EKG Interpretation    Date/Time:  Sunday April 29 2013 13:05:24 EST Ventricular Rate:  75 PR Interval:  166 QRS Duration: 106 QT Interval:  402 QTC Calculation: 449 R Axis:   -38 Text Interpretation:  Sinus rhythm Atrial premature complex Abnormal R-wave progression, late transition LVH with secondary repolarization abnormality No previous tracing Confirmed by Anitra Lauth  MD, Aunika Kirsten ([id number]) on 04/29/2013 1:15:49 PM            Pt with AMS per family.  They states having trouble walking and feeling shaky requiring help.  Also decreased po intake.  Abnormal finger to nose on exam and mild difficulty following commands.  NO signs of infection.  MRI pending to eval for stroke.  Gwyneth Sprout, MD 05/02/13 1414

## 2013-05-02 NOTE — ED Provider Notes (Signed)
Medical screening examination/treatment/procedure(s) were performed by resident physician or non-physician practitioner and as supervising physician I was immediately available for consultation/collaboration.   Barkley Bruns MD.   Linna Hoff, MD 05/02/13 [phone]

## 2013-05-18 ENCOUNTER — Ambulatory Visit: Payer: Self-pay | Admitting: Podiatry

## 2013-06-22 ENCOUNTER — Encounter: Payer: Self-pay | Admitting: Podiatry

## 2013-06-22 ENCOUNTER — Ambulatory Visit (INDEPENDENT_AMBULATORY_CARE_PROVIDER_SITE_OTHER): Payer: Medicare Other | Admitting: Podiatry

## 2013-06-22 VITALS — BP 142/65 | HR 75 | Resp 16 | Ht 66.0 in | Wt 180.0 lb

## 2013-06-22 DIAGNOSIS — B351 Tinea unguium: Secondary | ICD-10-CM

## 2013-06-22 DIAGNOSIS — M79609 Pain in unspecified limb: Secondary | ICD-10-CM

## 2013-06-22 NOTE — Progress Notes (Signed)
Subjective:     Patient ID: Margaret Hernandez, female   DOB: 1929/10/05, 80 y.o.   MRN: [id number]  HPI patient presents with nail disease 1-5 of both feet that are thick sore in the corners and not easy for her to cut   Review of Systems     Objective:   Physical Exam Neurovascular status intact with thick damage nails 1-5 both feet that are sore when pressed    Assessment:     Chronic mycotic nail infection with pain 1-5 both feet    Plan:     Debridement of painful nailbeds 1-5 both feet with no bleeding noted

## 2013-06-26 ENCOUNTER — Ambulatory Visit: Payer: Self-pay | Admitting: Podiatry

## 2013-09-10 IMAGING — XA IR VASCULAR PROCEDURE
15 of 24 series · 15 of 24 positions shown · non-contrast
Comparison: none

[Series 1: care aorta · 1 of 2 slices shown (1 of 11)]
[im 1/2]
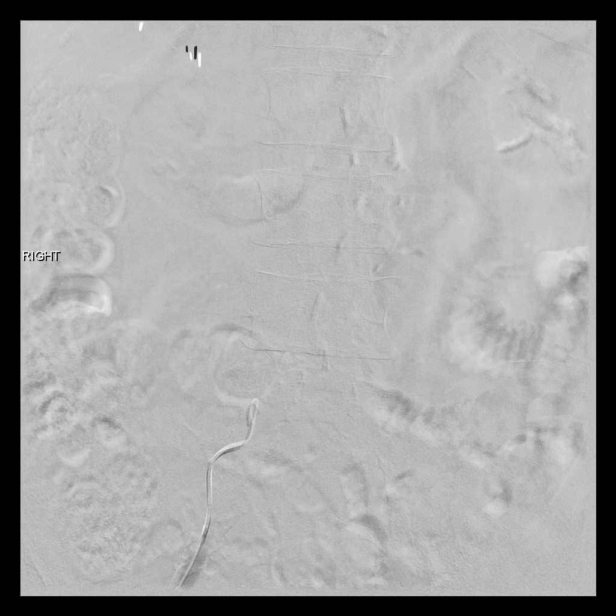

[Series 3: care aorta · 1 of 3 slices shown (2 of 11)]
[im 1/3]
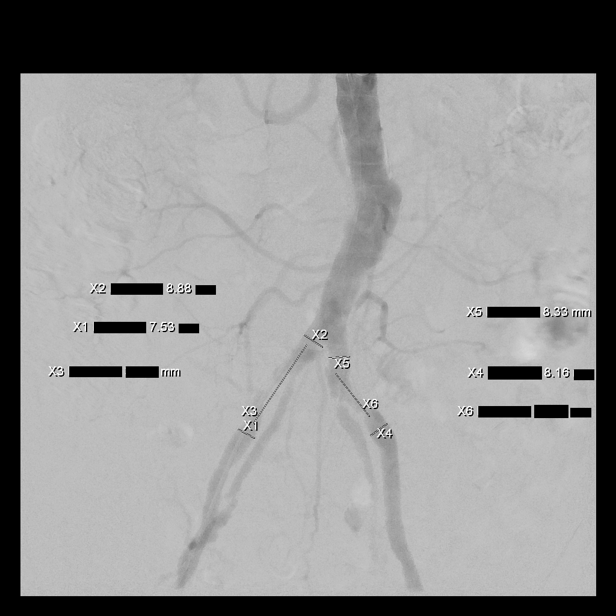

[Series 5: fl - angio · 1 of 1 slices shown (1 of 4)]
[im 1/1]
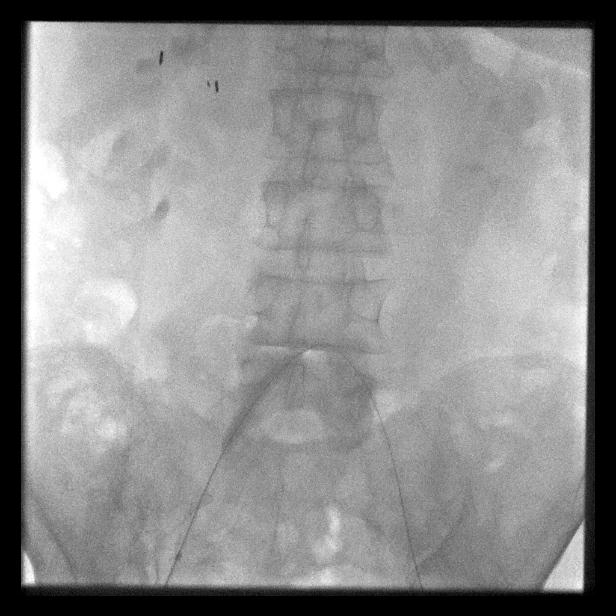

[Series 6: care aorta · 1 of 2 slices shown (3 of 11)]
[im 1/2]
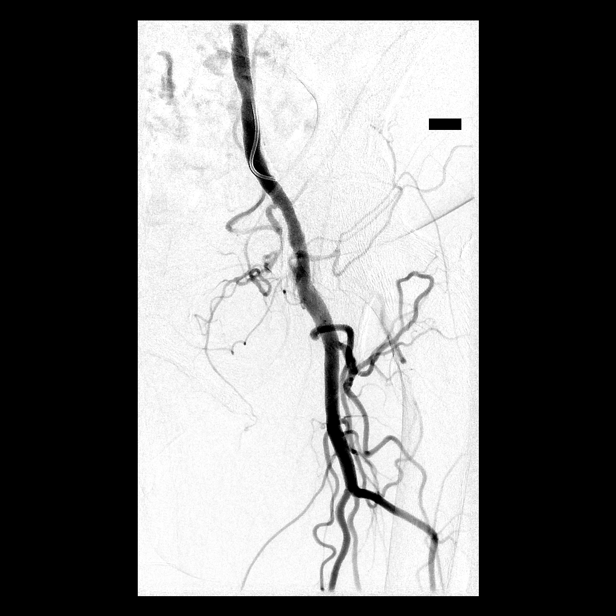

[Series 8: care aorta · 1 of 2 slices shown (4 of 11)]
[im 1/2]
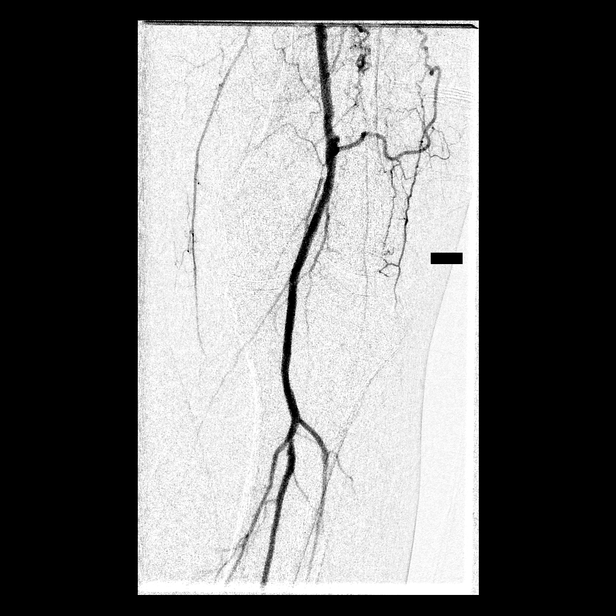

[Series 9: care aorta · 1 of 2 slices shown (5 of 11)]
[im 1/2]
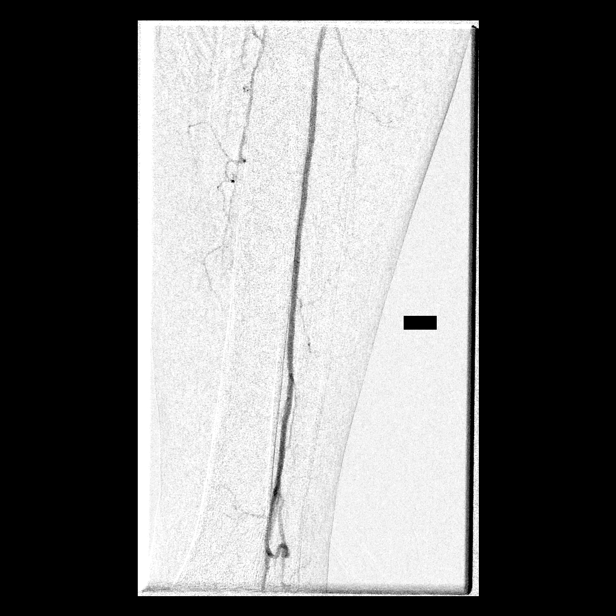

[Series 11: care aorta · 1 of 2 slices shown (6 of 11)]
[im 1/2]
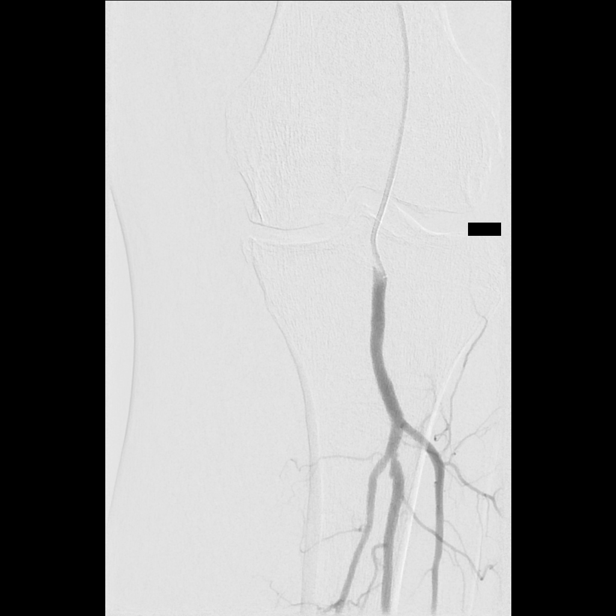

[Series 13: fl - angio · 1 of 1 slices shown (2 of 4)]
[im 1/1]
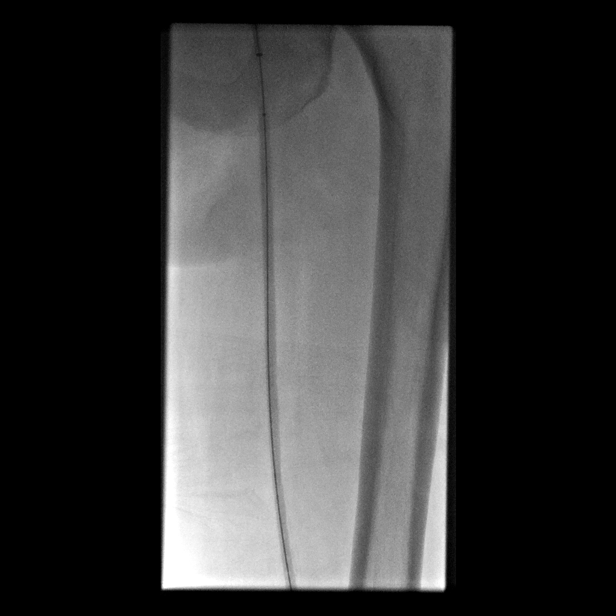

[Series 14: fl - angio · 1 of 1 slices shown (3 of 4)]
[im 1/1]
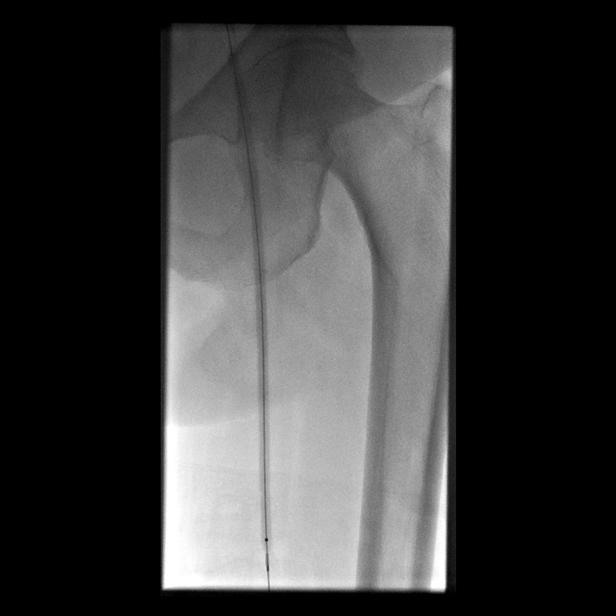

[Series 16: care aorta · 1 of 2 slices shown (7 of 11)]
[im 1/2]
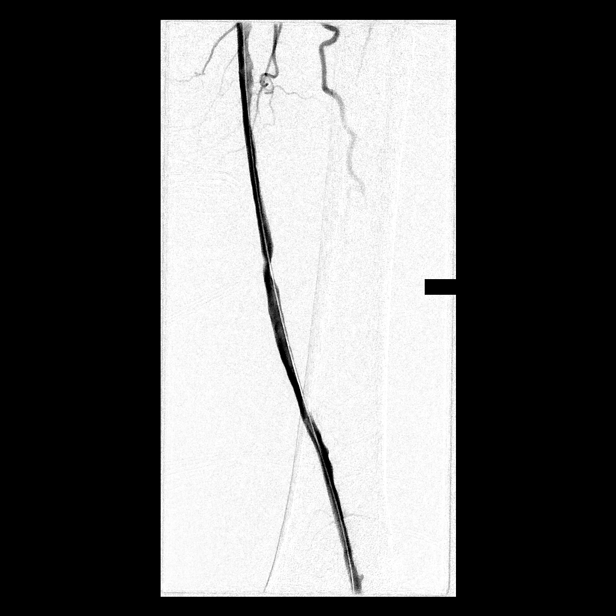

[Series 17: care aorta · 1 of 2 slices shown (8 of 11)]
[im 1/2]
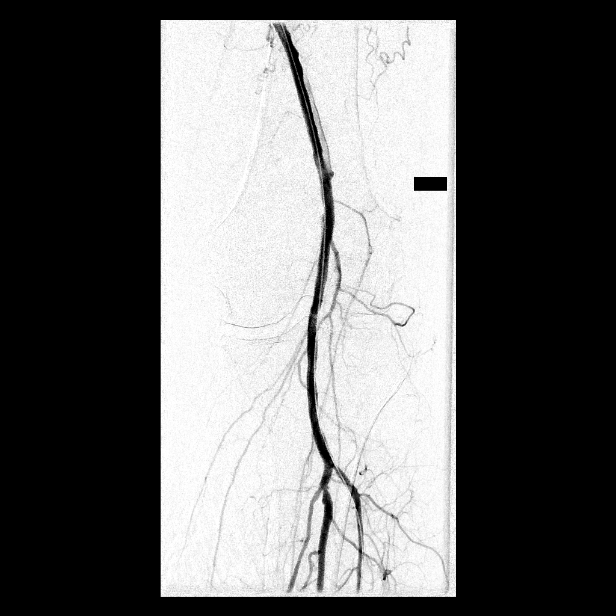

[Series 19: fl - angio · 1 of 1 slices shown (4 of 4)]
[im 1/1]
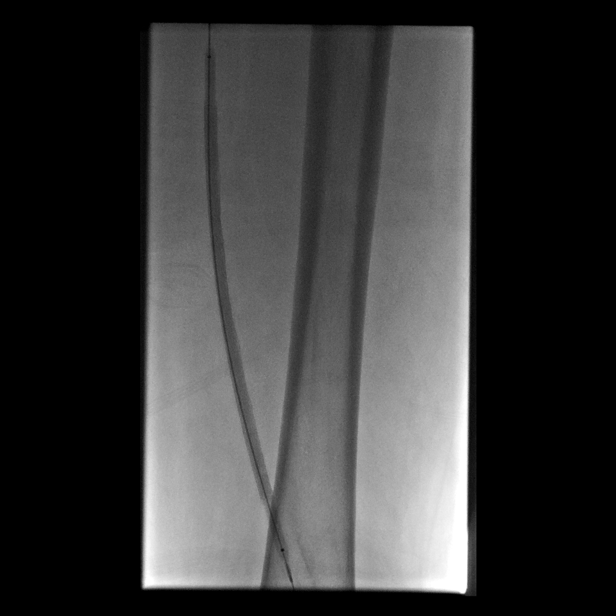

[Series 21: care aorta · 1 of 2 slices shown (9 of 11)]
[im 1/2]
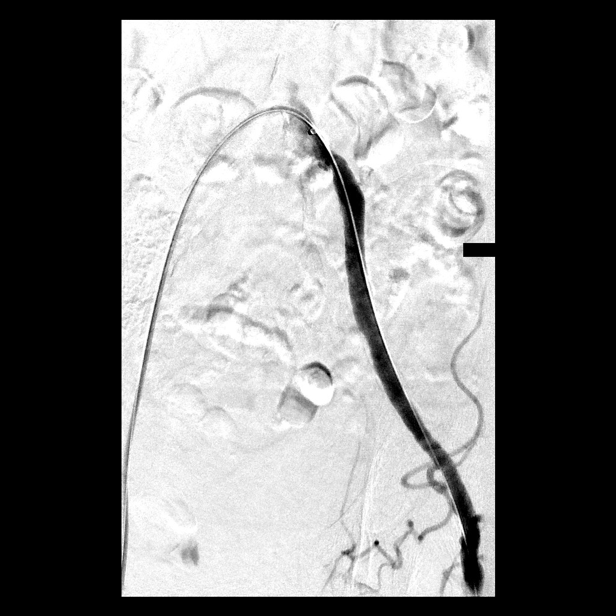

[Series 22: care aorta · 1 of 2 slices shown (10 of 11)]
[im 1/2]
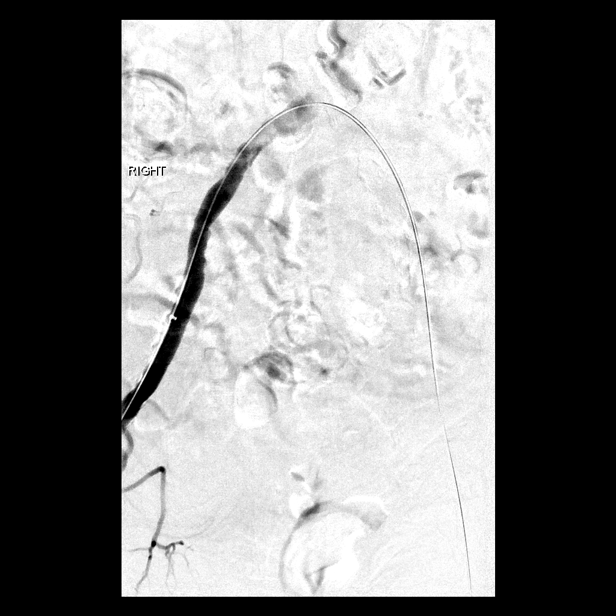

[Series 24: care aorta · 1 of 2 slices shown (11 of 11)]
[im 1/2]
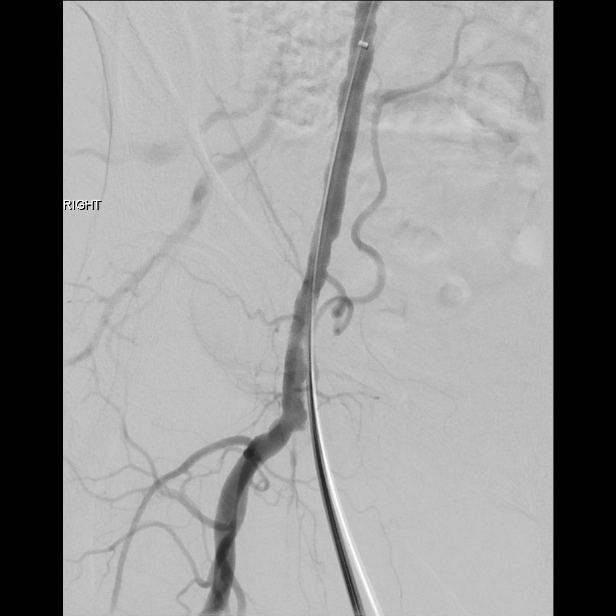

[15 of 24 positions shown; findings below may reference images not displayed]

IMAGES IMPORTED FROM THE [employer] WORKFLOW SYSTEM
NO DICTATION FOR STUDY

## 2013-09-12 DIAGNOSIS — E119 Type 2 diabetes mellitus without complications: Secondary | ICD-10-CM | POA: Insufficient documentation

## 2013-09-12 DIAGNOSIS — K219 Gastro-esophageal reflux disease without esophagitis: Secondary | ICD-10-CM | POA: Diagnosis present

## 2013-09-12 DIAGNOSIS — E785 Hyperlipidemia, unspecified: Secondary | ICD-10-CM | POA: Insufficient documentation

## 2013-09-21 ENCOUNTER — Ambulatory Visit: Payer: Medicare Other | Admitting: Podiatry

## 2013-09-28 ENCOUNTER — Ambulatory Visit: Payer: Medicare Other | Admitting: Podiatry

## 2013-09-29 ENCOUNTER — Encounter (HOSPITAL_COMMUNITY): Payer: Self-pay | Admitting: Emergency Medicine

## 2013-09-29 ENCOUNTER — Emergency Department (HOSPITAL_COMMUNITY): Payer: Medicare Other

## 2013-09-29 ENCOUNTER — Emergency Department (HOSPITAL_COMMUNITY)
Admission: EM | Admit: 2013-09-29 | Discharge: 2013-09-29 | Disposition: A | Discharge status: Home / Self Care | Payer: Medicare Other | Attending: Emergency Medicine | Admitting: Emergency Medicine

## 2013-09-29 DIAGNOSIS — E119 Type 2 diabetes mellitus without complications: Secondary | ICD-10-CM | POA: Insufficient documentation

## 2013-09-29 DIAGNOSIS — F29 Unspecified psychosis not due to a substance or known physiological condition: Secondary | ICD-10-CM | POA: Insufficient documentation

## 2013-09-29 DIAGNOSIS — F0390 Unspecified dementia without behavioral disturbance: Secondary | ICD-10-CM | POA: Insufficient documentation

## 2013-09-29 DIAGNOSIS — R34 Anuria and oliguria: Secondary | ICD-10-CM | POA: Insufficient documentation

## 2013-09-29 DIAGNOSIS — Z7902 Long term (current) use of antithrombotics/antiplatelets: Secondary | ICD-10-CM | POA: Insufficient documentation

## 2013-09-29 DIAGNOSIS — R5383 Other fatigue: Secondary | ICD-10-CM

## 2013-09-29 DIAGNOSIS — Z79899 Other long term (current) drug therapy: Secondary | ICD-10-CM | POA: Insufficient documentation

## 2013-09-29 DIAGNOSIS — R5381 Other malaise: Secondary | ICD-10-CM | POA: Insufficient documentation

## 2013-09-29 DIAGNOSIS — I10 Essential (primary) hypertension: Secondary | ICD-10-CM | POA: Insufficient documentation

## 2013-09-29 DIAGNOSIS — Z791 Long term (current) use of non-steroidal anti-inflammatories (NSAID): Secondary | ICD-10-CM | POA: Insufficient documentation

## 2013-09-29 DIAGNOSIS — R630 Anorexia: Secondary | ICD-10-CM | POA: Insufficient documentation

## 2013-09-29 LAB — COMPREHENSIVE METABOLIC PANEL
ALBUMIN: 3.9 g/dL (ref 3.5–5.2)
ALK PHOS: 71 U/L (ref 39–117)
ALT: 19 U/L (ref 0–35)
AST: 22 U/L (ref 0–37)
BUN: 24 mg/dL — AB (ref 6–23)
CALCIUM: 9.4 mg/dL (ref 8.4–10.5)
CO2: 28 mEq/L (ref 19–32)
Chloride: 96 mEq/L (ref 96–112)
Creatinine, Ser: 1.17 mg/dL — ABNORMAL HIGH (ref 0.50–1.10)
GFR calc Af Amer: 49 mL/min — ABNORMAL LOW (ref >90)
GFR calc non Af Amer: 43 mL/min — ABNORMAL LOW (ref >90)
GLUCOSE: 119 mg/dL — AB (ref 70–99)
POTASSIUM: 3.5 meq/L — AB (ref 3.7–5.3)
Sodium: 138 mEq/L (ref 137–147)
TOTAL PROTEIN: 7.6 g/dL (ref 6.0–8.3)
Total Bilirubin: 0.4 mg/dL (ref 0.3–1.2)

## 2013-09-29 LAB — I-STAT CHEM 8, ED
BUN: 24 mg/dL — ABNORMAL HIGH (ref 6–23)
CALCIUM ION: 1.12 mmol/L — AB (ref 1.13–1.30)
CREATININE: 1.3 mg/dL — AB (ref 0.50–1.10)
Chloride: 98 mEq/L (ref 96–112)
GLUCOSE: 150 mg/dL — AB (ref 70–99)
HEMATOCRIT: 39 % (ref 36.0–46.0)
Hemoglobin: 13.3 g/dL (ref 12.0–15.0)
Potassium: 3.3 mEq/L — ABNORMAL LOW (ref 3.7–5.3)
Sodium: 138 mEq/L (ref 137–147)
TCO2: 24 mmol/L (ref 0–100)

## 2013-09-29 LAB — APTT: aPTT: 25 seconds (ref 24–37)

## 2013-09-29 LAB — CBC WITH DIFFERENTIAL/PLATELET
BASOS PCT: 0 % (ref 0–1)
Basophils Absolute: 0 10*3/uL (ref 0.0–0.1)
EOS ABS: 0 10*3/uL (ref 0.0–0.7)
EOS PCT: 0 % (ref 0–5)
HCT: 36.2 % (ref 36.0–46.0)
HEMOGLOBIN: 12 g/dL (ref 12.0–15.0)
LYMPHS ABS: 2.6 10*3/uL (ref 0.7–4.0)
Lymphocytes Relative: 25 % (ref 12–46)
MCH: 25.1 pg — AB (ref 26.0–34.0)
MCHC: 33.1 g/dL (ref 30.0–36.0)
MCV: 75.6 fL — AB (ref 78.0–100.0)
Monocytes Absolute: 0.8 10*3/uL (ref 0.1–1.0)
Monocytes Relative: 8 % (ref 3–12)
NEUTROS PCT: 67 % (ref 43–77)
Neutro Abs: 6.8 10*3/uL (ref 1.7–7.7)
Platelets: 266 10*3/uL (ref 150–400)
RBC: 4.79 MIL/uL (ref 3.87–5.11)
RDW: 13.7 % (ref 11.5–15.5)
WBC: 10.2 10*3/uL (ref 4.0–10.5)

## 2013-09-29 LAB — PROTIME-INR
INR: 0.94 (ref 0.00–1.49)
PROTHROMBIN TIME: 12.4 s (ref 11.6–15.2)

## 2013-09-29 LAB — AMMONIA: Ammonia: 23 umol/L (ref 11–60)

## 2013-09-29 LAB — I-STAT CG4 LACTIC ACID, ED: Lactic Acid, Venous: 2.07 mmol/L (ref 0.5–2.2)

## 2013-09-29 LAB — ABO/RH: ABO/RH(D): O POS

## 2013-09-29 LAB — ETHANOL: Alcohol, Ethyl (B): <11 mg/dL (ref 0–11)

## 2013-09-29 LAB — TYPE AND SCREEN
ABO/RH(D): O POS
ANTIBODY SCREEN: NEGATIVE

## 2013-09-29 MED ORDER — SODIUM CHLORIDE 0.9 % IV SOLN
1000.0000 mL | INTRAVENOUS | Status: DC
  Administered 2013-09-29: 1000 mL via INTRAVENOUS

## 2013-09-29 MED ORDER — SODIUM CHLORIDE 0.9 % IV SOLN
1000.0000 mL | Freq: Once | INTRAVENOUS | Status: AC
  Administered 2013-09-29: 1000 mL via INTRAVENOUS

## 2013-09-29 NOTE — ED Provider Notes (Signed)
CSN: [id number]     Arrival date & time 09/29/13  1410 History   First MD Initiated Contact with Patient 09/29/13 1535     Chief Complaint  Patient presents with  . Fatigue   HPI Pt has been sleeping the last three days.  She has been very weak.  Family has to help her to the bathroom otherwise she has not been getting around.  She has been less responsive and just answering questions with one work answers.  Her appetite has been decreased and she has not been eating much.   No vomiting or diarrhea.  No fevers.  One loose stool this am.  No complaints of pain.  Past Medical History  Diagnosis Date  . Diabetes mellitus without complication   . Hypertension   . Dementia    Past Surgical History  Procedure Laterality Date  . Shoulder surgery    . Abdominal hysterectomy     No family history on file. History  Substance Use Topics  . Smoking status: Never Smoker   . Smokeless tobacco: Not on file  . Alcohol Use: No   OB History   Grav Para Term Preterm Abortions TAB SAB Ect Mult Living                 Review of Systems  Constitutional: Negative for fever.  Respiratory: Negative for wheezing.   Cardiovascular: Negative for chest pain.  Gastrointestinal: Negative for vomiting and anal bleeding.  Genitourinary: Positive for decreased urine volume.  Neurological: Positive for weakness. Negative for seizures.  Psychiatric/Behavioral: Positive for confusion.  All other systems reviewed and are negative.     Allergies  Review of patient's allergies indicates no known allergies.  Home Medications   Prior to Admission medications   Medication Sig Start Date End Date Taking? Authorizing Provider  amLODipine (NORVASC) 5 MG tablet Take 5 mg by mouth daily.    Historical Provider, MD  chlorthalidone (HYGROTON) 25 MG tablet Take 25 mg by mouth daily.    Historical Provider, MD  clopidogrel (PLAVIX) 75 MG tablet Take 75 mg by mouth daily with breakfast.    Historical Provider, MD   donepezil (ARICEPT) 10 MG tablet Take 10 mg by mouth at bedtime.    Historical Provider, MD  gabapentin (NEURONTIN) 400 MG capsule Take 400 mg by mouth 3 (three) times daily.    Historical Provider, MD  glimepiride (AMARYL) 2 MG tablet Take 2 mg by mouth daily with breakfast.    Historical Provider, MD  lisinopril (PRINIVIL,ZESTRIL) 20 MG tablet Take 20 mg by mouth daily.    Historical Provider, MD  meloxicam (MOBIC) 15 MG tablet Take 15 mg by mouth daily.    Historical Provider, MD  omeprazole (PRILOSEC) 40 MG capsule Take 40 mg by mouth daily.    Historical Provider, MD  rosuvastatin (CRESTOR) 40 MG tablet Take 40 mg by mouth daily.    Historical Provider, MD   BP 149/67  Pulse 95  Temp(Src) 98.2 F (36.8 C) (Oral)  Resp 18  Ht 5\' 5"  (1.651 m)  Wt 173 lb 8 oz (78.699 kg)  BMI 28.87 kg/m2  SpO2 100% Physical Exam  Nursing note and vitals reviewed. Constitutional: She appears well-developed and well-nourished. She appears listless. No distress.  HENT:  Head: Normocephalic and atraumatic.  Right Ear: External ear normal.  Left Ear: External ear normal.  Mouth/Throat: Oropharynx is clear and moist.  Eyes: Conjunctivae are normal. Right eye exhibits no discharge. Left eye exhibits no discharge.  No scleral icterus.  Neck: Neck supple. No tracheal deviation present.  Cardiovascular: Normal rate, regular rhythm and intact distal pulses.   Pulmonary/Chest: Effort normal and breath sounds normal. No stridor. No respiratory distress. She has no wheezes. She has no rales.  Abdominal: Soft. Bowel sounds are normal. She exhibits no distension. There is no tenderness. There is no rebound and no guarding.  Musculoskeletal: She exhibits no edema and no tenderness.  Neurological: She appears listless. She is disoriented (aware of person and place). No cranial nerve deficit (No facial droop, extraocular movements intact, tongue midline ) or sensory deficit. She exhibits normal muscle tone. She  displays no seizure activity. Coordination normal. GCS eye subscore is 4. GCS verbal subscore is 4. GCS motor subscore is 6.  No pronator drift bilateral upper extrem, able to hold both legs off bed for 5 seconds, sensation intact in all extremities, no visual field cuts, no left or right sided neglect, normal finger-nose exam bilaterally, no nystagmus noted   Skin: Skin is warm and dry. No rash noted.  Psychiatric: She has a normal mood and affect.    ED Course  Procedures (including critical care time) Labs Review Labs Reviewed  CBC WITH DIFFERENTIAL - Abnormal; Notable for the following:    MCV 75.6 (*)    MCH 25.1 (*)    All other components within normal limits  COMPREHENSIVE METABOLIC PANEL - Abnormal; Notable for the following:    Potassium 3.5 (*)    Glucose, Bld 119 (*)    BUN 24 (*)    Creatinine, Ser 1.17 (*)    GFR calc non Af Amer 43 (*)    GFR calc Af Amer 49 (*)    All other components within normal limits  I-STAT CHEM 8, ED - Abnormal; Notable for the following:    Potassium 3.3 (*)    BUN 24 (*)    Creatinine, Ser 1.30 (*)    Glucose, Bld 150 (*)    Calcium, Ion 1.12 (*)    All other components within normal limits  APTT  PROTIME-INR  ETHANOL  AMMONIA  URINALYSIS, ROUTINE W REFLEX MICROSCOPIC  CBG MONITORING, ED  I-STAT CG4 LACTIC ACID, ED  TYPE AND SCREEN  ABO/RH    Imaging Review Dg Chest 2 View  09/29/2013   CLINICAL DATA:  Fatique.  EXAM: CHEST  2 VIEW  COMPARISON:  04/29/2013  FINDINGS: Cardiac silhouette is normal in size and configuration. The aorta is mildly uncoiled. No mediastinal or hilar masses. Clear lungs. No pleural effusion. No pneumothorax.  Bony thorax is demineralized but intact.  IMPRESSION: No acute cardiopulmonary disease.   Electronically Signed   By: Amie Portlandavid  Ormond M.D.   On: 09/29/2013 16:53   Ct Head Wo Contrast  09/29/2013   CLINICAL DATA:  Fatigue.  EXAM: CT HEAD WITHOUT CONTRAST  TECHNIQUE: Contiguous axial images were obtained  from the base of the skull through the vertex without intravenous contrast.  COMPARISON:  04/29/2013.  FINDINGS: Normal appearing cerebral hemispheres and posterior fossa structures. Normal size and position of the ventricles. No intracranial hemorrhage, mass lesion or CT evidence of acute infarction. Unremarkable bones and included paranasal sinuses.  IMPRESSION: Normal examination.   Electronically Signed   By: Gordan PaymentSteve  Reid M.D.   On: 09/29/2013 16:40    MDM   Final diagnoses:  Fatigue    Pt feels better now after her evaluation in the ED.  She has been able to walk without difficulty.  No sign of  acute infection.  No focal neuro symptoms.  Doubt stroke or TIA.  Does not appear to be dehydrated.  Pt would like to go home.  I feel this is reasonable at this point.  Discussed outpatient follow up and warning signs and precautions.    Celene Kras, MD 09/29/13 [phone]

## 2013-09-29 NOTE — ED Notes (Signed)
Pt transported to CT scan.

## 2013-09-29 NOTE — Discharge Instructions (Signed)

## 2013-09-29 NOTE — ED Notes (Signed)
Dr. Knapp at the bedside.  

## 2013-09-29 NOTE — ED Notes (Signed)
Visitor reports that pt has been sleeping for 3 days straight last week and this week. Pt also with decrease PO intake. Pt with inappropriate answers to questions.

## 2013-10-02 LAB — CBG MONITORING, ED: Glucose-Capillary: 165 mg/dL — ABNORMAL HIGH (ref 70–99)

## 2013-10-05 ENCOUNTER — Ambulatory Visit: Payer: Medicare Other | Admitting: Podiatry

## 2013-11-08 ENCOUNTER — Other Ambulatory Visit (HOSPITAL_COMMUNITY): Payer: Self-pay | Admitting: Nephrology

## 2013-11-08 DIAGNOSIS — N185 Chronic kidney disease, stage 5: Secondary | ICD-10-CM

## 2013-11-13 ENCOUNTER — Ambulatory Visit (HOSPITAL_COMMUNITY): Admission: RE | Admit: 2013-11-13 | Payer: Medicare Other | Source: Ambulatory Visit

## 2013-12-24 ENCOUNTER — Ambulatory Visit (INDEPENDENT_AMBULATORY_CARE_PROVIDER_SITE_OTHER): Payer: Medicare Other | Admitting: Neurology

## 2013-12-24 ENCOUNTER — Encounter: Payer: Self-pay | Admitting: Neurology

## 2013-12-24 VITALS — BP 120/60 | HR 92 | Ht 65.0 in | Wt 163.0 lb

## 2013-12-24 DIAGNOSIS — E1165 Type 2 diabetes mellitus with hyperglycemia: Secondary | ICD-10-CM | POA: Insufficient documentation

## 2013-12-24 DIAGNOSIS — G8194 Hemiplegia, unspecified affecting left nondominant side: Secondary | ICD-10-CM

## 2013-12-24 DIAGNOSIS — R404 Transient alteration of awareness: Secondary | ICD-10-CM

## 2013-12-24 DIAGNOSIS — F03918 Unspecified dementia, unspecified severity, with other behavioral disturbance: Secondary | ICD-10-CM | POA: Insufficient documentation

## 2013-12-24 DIAGNOSIS — G8190 Hemiplegia, unspecified affecting unspecified side: Secondary | ICD-10-CM

## 2013-12-24 DIAGNOSIS — F0391 Unspecified dementia with behavioral disturbance: Secondary | ICD-10-CM | POA: Insufficient documentation

## 2013-12-24 DIAGNOSIS — IMO0002 Reserved for concepts with insufficient information to code with codable children: Secondary | ICD-10-CM | POA: Insufficient documentation

## 2013-12-24 DIAGNOSIS — E119 Type 2 diabetes mellitus without complications: Secondary | ICD-10-CM | POA: Insufficient documentation

## 2013-12-24 NOTE — Progress Notes (Signed)
NEUROLOGY CONSULTATION NOTE  Margaret Hernandez MRN: [id number] DOB: [birth year]  Referring provider: Dr. Dennison MascotLemont Hernandez Primary care provider: Dr. Dennison MascotLemont Hernandez  Reason for consult:  dementia  Dear Dr Margaret Hernandez:  Thank you for your kind referral of Margaret Hernandez for consultation of the above symptoms. Although her history is well known to you, please allow me to reiterate it for the purpose of our medical record. The patient was accompanied to the clinic by her daughter who also provides collateral information. Records and images were personally reviewed where available.  HISTORY OF PRESENT ILLNESS: This is a pleasant 80 year old right-handed woman with a history of hypertension, hyperlipidemia, and dementia, presenting for episodes of altered mental status that interestingly occur only on weekends.  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  She was found to have a UTI at that time and was diagnosed with dementia and started on Aricept.  Since then, she has been living with her daughter and stopped working and driving.  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  Since September, things have been going down.  She forgets that she gave money away to someone.  She needs reminders to take her medication.  Over the past 2 months, she has been having episodes that only occur during the weekend, where she would sleep on and off from Saturday to Monday, refusing to drink, eat, or bathe.  Her daughter reports it is like having a child, "she won't do anything," closing her mouth shut when fed food. She looks at family like she doesn't recognize them. Ms. Margaret Hernandez states she has no recollection of this.  Her daughter reports this is a change in her personality. Then starting Tuesday until the  weekend, she is back to baseline, with good appetite, awake and cooperative.  Over the past month, she has been having episodes of whole body trembling, occurring while standing, sitting, or even lying down.  She is able to talk during this, but she has fallen three times in the past 2 weeks due to the tremors in her legs.  She has a cane and walker, but her daughter feels these cannot support her when this happens.  Namenda XR was started 2 weeks ago.  On review of ER records, she was brought to the ER in 03/2013 for altered mental status, increasing weakness and confusion.  She was reported to be unsteady, and was not eating or drinking. She was noted to have a blunted affect and very slow to respond. There was no UTI at that time.  I personally reviewed MRI brain done at that time which did not show any acute changes, with mild bilateral chronic microvascular changes.  She was brought back to the ER in 09/2013 for one of the episodes noted above. She had been sleeping for 3 days, very weak, less responsive with only one word answers. She was noted to be listless, disoriented, non-focal exam. CBC and CMP were unremarkable except for BUN 24, creatinine 1.17, ammonia negative. Head CT unremarkable.  She was discharged home in improved condition.  She has had visual hallucinations over the past year or so where she would see a black snake in her room.  She has called her neighbor and has woken her daughter up  at 2am reporting seeing the snake.  This has happened twice in the last month.  She denies any auditory hallucinations. She denies any headaches, dizziness, diplopia, dysarthria, dysphagia, focal numbness/tingling. She has been using adult diapers for the past year.  She feels her memory is "pretty good."  She usually watches TV during the day.  Her mother had dementia.  Laboratory Data: Lab Results  Component Value Date   WBC 10.2 09/29/2013   HGB 12.0 09/29/2013   HCT 36.2 09/29/2013   MCV 75.6* 09/29/2013    PLT 266 09/29/2013     Chemistry      Component Value Date/Time   NA 138 09/29/2013 1659   K 3.5* 09/29/2013 1659   CL 96 09/29/2013 1659   CO2 28 09/29/2013 1659   BUN 24* 09/29/2013 1659   CREATININE 1.17* 09/29/2013 1659      Component Value Date/Time   CALCIUM 9.4 09/29/2013 1659   ALKPHOS 71 09/29/2013 1659   AST 22 09/29/2013 1659   ALT 19 09/29/2013 1659   BILITOT 0.4 09/29/2013 1659     PAST MEDICAL HISTORY: Past Medical History  Diagnosis Date  . Diabetes mellitus without complication   . Hypertension   . Dementia     PAST SURGICAL HISTORY: Past Surgical History  Procedure Laterality Date  . Shoulder surgery    . Abdominal hysterectomy      MEDICATIONS: Current Outpatient Prescriptions on File Prior to Visit  Medication Sig Dispense Refill  . amLODipine (NORVASC) 5 MG tablet Take 5 mg by mouth daily.      . chlorthalidone (HYGROTON) 25 MG tablet Take 25 mg by mouth daily.      . clopidogrel (PLAVIX) 75 MG tablet Take 75 mg by mouth daily with breakfast.      . donepezil (ARICEPT) 10 MG tablet Take 10 mg by mouth at bedtime.      . gabapentin (NEURONTIN) 400 MG capsule Take 400 mg by mouth 3 (three) times daily.      Marland Kitchen glimepiride (AMARYL) 2 MG tablet Take 2 mg by mouth daily with breakfast.      . lisinopril (PRINIVIL,ZESTRIL) 20 MG tablet Take 20 mg by mouth daily.       No current facility-administered medications on file prior to visit.    ALLERGIES: No Known Allergies  FAMILY HISTORY: History reviewed. No pertinent family history.  SOCIAL HISTORY: History   Social History  . Marital Status: Single    Spouse Name: N/A    Number of Children: N/A  . Years of Education: N/A   Occupational History  . Not on file.   Social History Main Topics  . Smoking status: Never Smoker   . Smokeless tobacco: Not on file  . Alcohol Use: No  . Drug Use: No  . Sexual Activity: No   Other Topics Concern  . Not on file   Social History Narrative  . No narrative on file      REVIEW OF SYSTEMS: Constitutional: No fevers, chills, or sweats, no generalized fatigue, change in appetite Eyes: No visual changes, double vision, eye pain Ear, nose and throat: No hearing loss, ear pain, nasal congestion, sore throat Cardiovascular: No chest pain, palpitations Respiratory:  No shortness of breath at rest or with exertion, wheezes GastrointestinaI: No nausea, vomiting, diarrhea, abdominal pain, fecal incontinence Genitourinary:  No dysuria, urinary retention or frequency Musculoskeletal:  No neck pain, back pain Integumentary: No rash, pruritus, skin lesions Neurological: as above Psychiatric: No depression, insomnia,  anxiety Endocrine: No palpitations, fatigue, diaphoresis, mood swings, change in appetite, change in weight, increased thirst Hematologic/Lymphatic:  No anemia, purpura, petechiae. Allergic/Immunologic: no itchy/runny eyes, nasal congestion, recent allergic reactions, rashes  PHYSICAL EXAM: Filed Vitals:   12/24/13 1331  BP: 120/60  Pulse: 92   General: No acute distress Head:  Normocephalic/atraumatic Eyes: Fundoscopic exam shows bilateral sharp discs, no vessel changes, exudates, or hemorrhages Neck: supple, no paraspinal tenderness, full range of motion Back: No paraspinal tenderness Heart: regular rate and rhythm Lungs: Clear to auscultation bilaterally. Vascular: No carotid bruits. Skin/Extremities: No rash, no edema Neurological Exam: Mental status: alert and oriented to person, place, and time, no dysarthria or aphasia, Fund of knowledge is appropriate.  Recent and remote memory are intact.  Attention and concentration are normal.    Able to name objects and repeat phrases. MMSE 30/30 Cranial nerves: CN I: not tested CN II: pupils equal, round and reactive to light, visual fields intact, fundi unremarkable. CN III, IV, VI:  full range of motion, no nystagmus, no ptosis CN V: facial sensation intact CN VII: upper and lower face  symmetric CN VIII: hearing intact to finger rub CN IX, X: gag intact, uvula midline CN XI: sternocleidomastoid and trapezius muscles intact CN XII: tongue midline Bulk & Tone: normal, no fasciculations. Motor: 4/5 on left UE and LE in pyramidal distribution, 5/5 on right UE and LE. +pronator drift on left UE Sensation: decreased pin on right foot, decreased vibration up to left ankle.  Intact to all modalities on both UE.  Romberg test negative Deep Tendon Reflexes: +2 throughout, no ankle clonus Plantar responses: upgoing on left, downgoing on right Cerebellar: no incoordination on finger to nose testing Gait: ambulates with cane, slow and cautious Tremor: none  IMPRESSION: This is an 80 year old right-handed woman with a history of hypertension and diabetes, presenting for evaluation of episodes of altered mental status that curiously occur only during the weekends.  She is noted to have hypersomnia, refusing to eat and bathe, then she is back to baseline during the week.  She has no recollection of these episodes.  Over the past month, she has been having episodes of whole body trembling with retained awareness. The etiology of her symptoms is unclear, her exam shows left-sided weakness in a pyramidal distribution, concerning for right hemisphere lesion.  MRI brain without contrast will be ordered.  The episodic confusion may relate to dementia, however MMSE today is 30/30.  Seizure is another consideration, although the time course of these only occurring during the weekend is atypical for seizure. Routine EEG will be ordered.  She may benefit from physical therapy for gait and balance therapy.  She will follow-up after the tests.  Thank you for allowing me to participate in the care of this patient. Please do not hesitate to call for any questions or concerns.   Patrcia Dolly, M.D.  CC: Dr. Thana Ates

## 2013-12-24 NOTE — Patient Instructions (Addendum)
1. MRI brain without contrast 2. Routine EEG 3. Continue Aricept and Namenda 4. Follow-up in 4 weeks

## 2013-12-26 ENCOUNTER — Ambulatory Visit (INDEPENDENT_AMBULATORY_CARE_PROVIDER_SITE_OTHER): Payer: Medicare Other | Admitting: Neurology

## 2013-12-26 DIAGNOSIS — G8194 Hemiplegia, unspecified affecting left nondominant side: Secondary | ICD-10-CM

## 2013-12-26 DIAGNOSIS — G8190 Hemiplegia, unspecified affecting unspecified side: Secondary | ICD-10-CM

## 2013-12-26 DIAGNOSIS — R404 Transient alteration of awareness: Secondary | ICD-10-CM

## 2013-12-26 NOTE — Procedures (Signed)
ELECTROENCEPHALOGRAM REPORT  Date of Study: 12/26/2013  Patient's Name: Margaret Hernandez MRN: [id number] Date of Birth: 08-16-1929  Referring Provider: Dr. Patrcia DollyKaren Aquino  Clinical History: This is an 80 year old woman with episodes of altered mental status that curiously occur only during the weekends. She is noted to have hypersomnia, refusing to eat and bathe, then she is back to baseline during the week. She has no recollection of these episodes. Over the past month, she has been having episodes of whole body trembling with retained awareness.   Medications: Aricept, Namenda, Neurontin, Amaryl, Prinivil, Zestril, Plavix, Norvasc, Hygroton  Technical Summary: A multichannel digital EEG recording measured by the international 10-20 system with electrodes applied with paste and impedances below 5000 ohms performed in our laboratory with EKG monitoring in an awake and drowsy patient.  Hyperventilation was not performed. Photic stimulation was performed.  The digital EEG was referentially recorded, reformatted, and digitally filtered in a variety of bipolar and referential montages for optimal display.   Description: The patient is awake and drowsy during the recording.  During maximal wakefulness, there is a symmetric, medium voltage 7-7.5 Hz posterior dominant rhythm that attenuates with eye opening.  There is rare focal or 2-3 Hz delta slowing seen over the right posterior temporal region.  During drowsiness, there is an increase in theta slowing of the background. Deeper stages of sleep are not seen. Photic stimulation did not elicit any abnormalities.  There were no epileptiform discharges or electrographic seizures seen.    EKG lead was unremarkable.  Impression: This awake and drowsy EEG is abnormal due to the presence of: 1. Slowing of the posterior dominant rhythm 2. Rare focal slowing over the right posterior temporal  Clinical Correlation: Slowing of the posterior dominant rhythm  indicates diffuse cerebral dysfunction that is nonspecific in etiology and can be seen with neurodegenerative conditions or toxic/metabolic encephalopathies.  Focal slowing over the right posterior temporal region indicates focal cerebral dysfunction suggestive of underlying structural or physiologic abnormality in this region.  The absence of epileptiform discharges does not rule out a clinical diagnosis of epilepsy.  Clinical correlation is advised.   Patrcia DollyKaren Aquino, M.D.

## 2014-01-10 ENCOUNTER — Ambulatory Visit: Payer: Medicare Other | Admitting: Neurology

## 2014-01-14 ENCOUNTER — Ambulatory Visit (HOSPITAL_COMMUNITY)
Admission: RE | Admit: 2014-01-14 | Discharge: 2014-01-14 | Disposition: A | Discharge status: Home / Self Care | Payer: Medicare (Managed Care) | Source: Ambulatory Visit | Attending: Neurology | Admitting: Neurology

## 2014-01-14 DIAGNOSIS — G8190 Hemiplegia, unspecified affecting unspecified side: Secondary | ICD-10-CM | POA: Insufficient documentation

## 2014-01-14 DIAGNOSIS — R404 Transient alteration of awareness: Secondary | ICD-10-CM | POA: Diagnosis present

## 2014-01-14 DIAGNOSIS — F0390 Unspecified dementia without behavioral disturbance: Secondary | ICD-10-CM | POA: Insufficient documentation

## 2014-01-14 DIAGNOSIS — G8194 Hemiplegia, unspecified affecting left nondominant side: Secondary | ICD-10-CM

## 2014-01-17 ENCOUNTER — Encounter: Payer: Self-pay | Admitting: Neurology

## 2014-01-17 ENCOUNTER — Ambulatory Visit (INDEPENDENT_AMBULATORY_CARE_PROVIDER_SITE_OTHER): Payer: Medicare (Managed Care) | Admitting: Neurology

## 2014-01-17 VITALS — BP 128/68 | HR 76 | Ht 65.0 in | Wt 171.0 lb

## 2014-01-17 DIAGNOSIS — R413 Other amnesia: Secondary | ICD-10-CM

## 2014-01-17 DIAGNOSIS — R404 Transient alteration of awareness: Secondary | ICD-10-CM

## 2014-01-17 NOTE — Patient Instructions (Signed)
1. Continue all your current medications 2. Call our office for any change in symptoms 3. Follow-up in 6 months

## 2014-01-17 NOTE — Progress Notes (Signed)
NEUROLOGY FOLLOW UP OFFICE NOTE  Margaret RamusBetty S Hernandez [id number]  HISTORY OF PRESENT ILLNESS: I had the pleasure of seeing Margaret Hernandez in follow-up in the neurology clinic on 01/17/2014.  She is again accompanied by her daughter today.  The patient was last seen a month ago for episodes of altered mental status that would occur only on weekends.  Her exam on initial visit had shown mild left-sided weakness that was not noted today.  Records and images were personally reviewed where available. I personally reviewed MRI brain without contrast which did not show any acute changes, there was mild diffuse atrophy and mild chronic microvascular change. Her routine EEG showed mild diffuse slowing and rare focal right temporal slowing, no epileptiform discharges.   Her daughter reports that there have been no further similar episodes since her last visit. She had been started on Namenda by her PCP.  Her daughter reports that she has been doing well, she participates with a day program twice a week.  She denies any headaches, focal symptoms.  HPI:  This is a pleasant 80 yo RH woman with a history of hypertension, hyperlipidemia, and dementia, who presented for episodes of altered mental status that interestingly occur only on weekends. 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 She was found to have a UTI at that time and was diagnosed with dementia and started on Aricept. Since then, she has been living with her daughter and stopped working and driving. 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 Since September, things have been going down. She forgets that she gave money away to someone. She needs reminders to take her medication.   Since May 2015, she had been having episodes that only occur during  the weekend, where she would sleep on and off from Saturday to Monday, refusing to drink, eat, or bathe. Her daughter reports it is like having a child, "she won't do anything," closing her mouth shut when fed food. She looks at family like she doesn't recognize them. Margaret Hernandez states she has no recollection of this. Her daughter reports this is a change in her personality. Then starting Tuesday until the weekend, she is back to baseline, with good appetite, awake and cooperative.  On review of ER records, she was brought to the ER in 03/2013 for altered mental status, increasing weakness and confusion. She was reported to be unsteady, and was not eating or drinking. She was noted to have a blunted affect and very slow to respond. There was no UTI at that time. I personally reviewed MRI brain done at that time which did not show any acute changes, with mild bilateral chronic microvascular changes. She was brought back to the ER in 09/2013 for one of the episodes noted above. She had been sleeping for 3 days, very weak, less responsive with only one word answers. She was noted to be listless, disoriented, non-focal exam. CBC and CMP were unremarkable except for BUN 24, creatinine 1.17, ammonia negative. Head CT unremarkable. She was discharged home in improved condition.   She has had visual hallucinations over the past year or so where she would see a black snake in her room. She has called her neighbor and has woken her daughter up at 2am reporting seeing the snake.  PAST MEDICAL HISTORY: Past Medical History  Diagnosis Date  . Diabetes mellitus without complication   . Hypertension   . Dementia     MEDICATIONS: Current Outpatient Prescriptions on File Prior to Visit  Medication Sig Dispense Refill  . alendronate (FOSAMAX) 70 MG tablet Take 70 mg by mouth.      Marland Kitchen amLODipine (NORVASC) 5 MG tablet Take 5 mg by mouth daily.      . chlorthalidone (HYGROTON) 25 MG tablet Take 25 mg by mouth daily.       . clopidogrel (PLAVIX) 75 MG tablet Take 75 mg by mouth daily with breakfast.      . donepezil (ARICEPT) 10 MG tablet Take 10 mg by mouth at bedtime.      . gabapentin (NEURONTIN) 400 MG capsule Take 400 mg by mouth 3 (three) times daily.      Marland Kitchen glimepiride (AMARYL) 2 MG tablet Take 2 mg by mouth daily with breakfast.      . lisinopril (PRINIVIL,ZESTRIL) 20 MG tablet Take 20 mg by mouth daily.       No current facility-administered medications on file prior to visit.    ALLERGIES: No Known Allergies  FAMILY HISTORY: No family history on file.  SOCIAL HISTORY: History   Social History  . Marital Status: Single    Spouse Name: N/A    Number of Children: N/A  . Years of Education: N/A   Occupational History  . Not on file.   Social History Main Topics  . Smoking status: Never Smoker   . Smokeless tobacco: Not on file  . Alcohol Use: No  . Drug Use: No  . Sexual Activity: No   Other Topics Concern  . Not on file   Social History Narrative  . No narrative on file    REVIEW OF SYSTEMS: Constitutional: No fevers, chills, or sweats, no generalized fatigue, change in appetite Eyes: No visual changes, double vision, eye pain Ear, nose and throat: No hearing loss, ear pain, nasal congestion, sore throat Cardiovascular: No chest pain, palpitations Respiratory:  No shortness of breath at rest or with exertion, wheezes GastrointestinaI: No nausea, vomiting, diarrhea, abdominal pain, fecal incontinence Genitourinary:  No dysuria, urinary retention or frequency Musculoskeletal:  No neck pain, back pain Integumentary: No rash, pruritus, skin lesions Neurological: as above Psychiatric: No depression, insomnia, anxiety Endocrine: No palpitations, fatigue, diaphoresis, mood swings, change in appetite, change in weight, increased thirst Hematologic/Lymphatic:  No anemia, purpura, petechiae. Allergic/Immunologic: no itchy/runny eyes, nasal congestion, recent allergic reactions,  rashes  PHYSICAL EXAM: Filed Vitals:   01/17/14 0941  BP: 128/68  Pulse: 76   General: No acute distress Head:  Normocephalic/atraumatic Neck: supple, no paraspinal tenderness, full range of motion Heart:  Regular rate and rhythm Lungs:  Clear to auscultation bilaterally Back: No paraspinal tenderness Skin/Extremities: No rash, no edema Neurological Exam: alert and oriented to person, place, and time. No aphasia or dysarthria. Fund of knowledge is appropriate.  Recent and remote memory are intact, 3/3 delayed recall.  Attention and concentration are normal.    Able to name objects and repeat phrases. Cranial nerves: Pupils equal, round, reactive to light.  Fundoscopic exam unremarkable, no papilledema. Extraocular movements intact with no nystagmus. Visual fields full. Facial sensation intact. No facial asymmetry. Tongue, uvula, palate midline.  Motor: Bulk and tone normal, muscle strength 5/5 throughout with no pronator drift.  Sensation to light touch, temperature and vibration intact.  No extinction to double simultaneous stimulation.  Deep tendon reflexes  2+ throughout, toes downgoing.  Finger to nose testing intact.  Gait narrow-based and steady, able to tandem walk adequately.  Romberg negative.  IMPRESSION: This is an 80 yo RH woman with a history of hypertension and diabetes, with episodes of altered mental status that curiously occur only during the weekends. She is noted to have hypersomnia, refusing to eat and bathe, then she is back to baseline during the week. She has no recollection of these episodes. MRI brain was unremarkable, EEG showed mild diffuse slowing. She has not had any further similar symptoms for the past 6 weeks.  The episodic confusion may relate to dementia, however MMSE on last visit was 30/30.  Since symptoms appear to have resolved, we have agreed to continue to monitor clinically and continue Aricept and Namenda.  They know to call if symptoms change or recur, at  which point ambulatory EEG will be ordered.  She will follow-up in 6 months.  Thank you for allowing me to participate in her care.  Please do not hesitate to call for any questions or concerns.  The duration of this appointment visit was 15 minutes of face-to-face time with the patient.  Greater than 50% of this time was spent in counseling, explanation of diagnosis, planning of further management, and coordination of care.   Margaret Hernandez, M.D.   CC: Dr. Thana Ates

## 2014-03-21 ENCOUNTER — Telehealth: Payer: Self-pay | Admitting: Neurology

## 2014-03-21 DIAGNOSIS — R413 Other amnesia: Secondary | ICD-10-CM

## 2014-03-21 DIAGNOSIS — R404 Transient alteration of awareness: Secondary | ICD-10-CM

## 2014-03-21 NOTE — Telephone Encounter (Signed)
Again on a weekend, hypersomnolent, did not want to eat, drink, bathe. Resolved by Monday. Concerning for behavioral issues, however primary neurological cause will be ruled out, 24-hour EEG will be ordered.  Tiffany, pls schedule 24-hr EEG, then contact Chales AbrahamsMary Ann with [facility] [phone]) regarding the date/time. Thanks!

## 2014-03-21 NOTE — Telephone Encounter (Signed)
Contacted [name]. Notified that pt is scheduled for 24 hour eeg on 05/01/14 @ 12:45. They will let they family know & arrange transportation for the patient.

## 2014-03-21 NOTE — Addendum Note (Signed)
Addended by: Franciso BendMCNEIL, Jamill Wetmore M on: 03/21/2014 02:35 PM   Modules accepted: Orders

## 2014-03-25 ENCOUNTER — Telehealth: Payer: Self-pay | Admitting: Neurology

## 2014-03-25 NOTE — Telephone Encounter (Signed)
Maryanne from [facility] would like to talk to you about Kathie RhodesBetty please call [phone]

## 2014-03-25 NOTE — Telephone Encounter (Signed)
Had another of hypersomnolent weekend episode, becomes incontinent, does not eat or drink. Home care coordinator, [facility]. 24-EEG is not until 05/01/14.   Dana, Pls put on cancellation list for 24-hr EEG, thanks

## 2014-04-23 ENCOUNTER — Emergency Department (HOSPITAL_COMMUNITY): Payer: Medicare (Managed Care)

## 2014-04-23 ENCOUNTER — Encounter (HOSPITAL_COMMUNITY): Payer: Self-pay | Admitting: Emergency Medicine

## 2014-04-23 ENCOUNTER — Inpatient Hospital Stay (HOSPITAL_COMMUNITY)
Admission: EM | Admit: 2014-04-23 | Discharge: 2014-04-30 | DRG: 101 | Disposition: A | Discharge status: Skilled Nursing Facility | Payer: Medicare (Managed Care) | Attending: Internal Medicine | Admitting: Internal Medicine

## 2014-04-23 DIAGNOSIS — E876 Hypokalemia: Secondary | ICD-10-CM | POA: Diagnosis not present

## 2014-04-23 DIAGNOSIS — E1165 Type 2 diabetes mellitus with hyperglycemia: Secondary | ICD-10-CM | POA: Diagnosis present

## 2014-04-23 DIAGNOSIS — E860 Dehydration: Secondary | ICD-10-CM | POA: Diagnosis present

## 2014-04-23 DIAGNOSIS — R404 Transient alteration of awareness: Secondary | ICD-10-CM | POA: Diagnosis not present

## 2014-04-23 DIAGNOSIS — Z7902 Long term (current) use of antithrombotics/antiplatelets: Secondary | ICD-10-CM | POA: Diagnosis not present

## 2014-04-23 DIAGNOSIS — D638 Anemia in other chronic diseases classified elsewhere: Secondary | ICD-10-CM | POA: Diagnosis present

## 2014-04-23 DIAGNOSIS — F0391 Unspecified dementia with behavioral disturbance: Secondary | ICD-10-CM | POA: Diagnosis present

## 2014-04-23 DIAGNOSIS — I10 Essential (primary) hypertension: Secondary | ICD-10-CM | POA: Diagnosis present

## 2014-04-23 DIAGNOSIS — Z79899 Other long term (current) drug therapy: Secondary | ICD-10-CM

## 2014-04-23 DIAGNOSIS — D539 Nutritional anemia, unspecified: Secondary | ICD-10-CM | POA: Diagnosis present

## 2014-04-23 DIAGNOSIS — R569 Unspecified convulsions: Secondary | ICD-10-CM | POA: Diagnosis not present

## 2014-04-23 DIAGNOSIS — IMO0002 Reserved for concepts with insufficient information to code with codable children: Secondary | ICD-10-CM | POA: Diagnosis present

## 2014-04-23 DIAGNOSIS — G40909 Epilepsy, unspecified, not intractable, without status epilepticus: Secondary | ICD-10-CM | POA: Diagnosis not present

## 2014-04-23 DIAGNOSIS — N179 Acute kidney failure, unspecified: Secondary | ICD-10-CM | POA: Diagnosis not present

## 2014-04-23 DIAGNOSIS — F03918 Unspecified dementia, unspecified severity, with other behavioral disturbance: Secondary | ICD-10-CM | POA: Diagnosis present

## 2014-04-23 DIAGNOSIS — G40901 Epilepsy, unspecified, not intractable, with status epilepticus: Secondary | ICD-10-CM | POA: Diagnosis not present

## 2014-04-23 DIAGNOSIS — W19XXXA Unspecified fall, initial encounter: Secondary | ICD-10-CM

## 2014-04-23 LAB — RAPID URINE DRUG SCREEN, HOSP PERFORMED
AMPHETAMINES: NOT DETECTED
BARBITURATES: NOT DETECTED
BENZODIAZEPINES: NOT DETECTED
Cocaine: NOT DETECTED
OPIATES: NOT DETECTED
Tetrahydrocannabinol: NOT DETECTED

## 2014-04-23 LAB — URINALYSIS, ROUTINE W REFLEX MICROSCOPIC
GLUCOSE, UA: NEGATIVE mg/dL
Ketones, ur: 15 mg/dL — AB
Leukocytes, UA: NEGATIVE
Nitrite: NEGATIVE
PH: 5 (ref 5.0–8.0)
Protein, ur: NEGATIVE mg/dL
SPECIFIC GRAVITY, URINE: 1.015 (ref 1.005–1.030)
Urobilinogen, UA: 0.2 mg/dL (ref 0.0–1.0)

## 2014-04-23 LAB — URINE MICROSCOPIC-ADD ON

## 2014-04-23 LAB — MAGNESIUM: Magnesium: 1.7 mg/dL (ref 1.5–2.5)

## 2014-04-23 LAB — CBC WITH DIFFERENTIAL/PLATELET
BASOS ABS: 0 10*3/uL (ref 0.0–0.1)
BASOS PCT: 0 % (ref 0–1)
Eosinophils Absolute: 0 10*3/uL (ref 0.0–0.7)
Eosinophils Relative: 0 % (ref 0–5)
HEMATOCRIT: 36.5 % (ref 36.0–46.0)
HEMOGLOBIN: 11.7 g/dL — AB (ref 12.0–15.0)
LYMPHS PCT: 13 % (ref 12–46)
Lymphs Abs: 1.3 10*3/uL (ref 0.7–4.0)
MCH: 24.6 pg — ABNORMAL LOW (ref 26.0–34.0)
MCHC: 32.1 g/dL (ref 30.0–36.0)
MCV: 76.8 fL — ABNORMAL LOW (ref 78.0–100.0)
MONO ABS: 0.9 10*3/uL (ref 0.1–1.0)
MONOS PCT: 9 % (ref 3–12)
Neutro Abs: 7.7 10*3/uL (ref 1.7–7.7)
Neutrophils Relative %: 78 % — ABNORMAL HIGH (ref 43–77)
Platelets: 263 10*3/uL (ref 150–400)
RBC: 4.75 MIL/uL (ref 3.87–5.11)
RDW: 13.5 % (ref 11.5–15.5)
WBC: 10 10*3/uL (ref 4.0–10.5)

## 2014-04-23 LAB — BASIC METABOLIC PANEL
ANION GAP: 22 — AB (ref 5–15)
BUN: 21 mg/dL (ref 6–23)
CHLORIDE: 96 meq/L (ref 96–112)
CO2: 24 meq/L (ref 19–32)
CREATININE: 1.12 mg/dL — AB (ref 0.50–1.10)
Calcium: 9.7 mg/dL (ref 8.4–10.5)
GFR calc non Af Amer: 44 mL/min — ABNORMAL LOW (ref >90)
GFR, EST AFRICAN AMERICAN: 52 mL/min — AB (ref >90)
Glucose, Bld: 153 mg/dL — ABNORMAL HIGH (ref 70–99)
Potassium: 3.5 mEq/L — ABNORMAL LOW (ref 3.7–5.3)
Sodium: 142 mEq/L (ref 137–147)

## 2014-04-23 LAB — HEPATIC FUNCTION PANEL
ALBUMIN: 3.7 g/dL (ref 3.5–5.2)
ALT: 13 U/L (ref 0–35)
AST: 25 U/L (ref 0–37)
Alkaline Phosphatase: 76 U/L (ref 39–117)
Total Bilirubin: 0.6 mg/dL (ref 0.3–1.2)
Total Protein: 7.5 g/dL (ref 6.0–8.3)

## 2014-04-23 LAB — PHOSPHORUS: Phosphorus: 2.7 mg/dL (ref 2.3–4.6)

## 2014-04-23 LAB — CBG MONITORING, ED: Glucose-Capillary: 139 mg/dL — ABNORMAL HIGH (ref 70–99)

## 2014-04-23 MED ORDER — GABAPENTIN 100 MG PO CAPS
200.0000 mg | ORAL_CAPSULE | Freq: Two times a day (BID) | ORAL | Status: DC
  Filled 2014-04-23 (×3): qty 2

## 2014-04-23 MED ORDER — AMLODIPINE BESYLATE 5 MG PO TABS
5.0000 mg | ORAL_TABLET | Freq: Every day | ORAL | Status: DC
  Filled 2014-04-23: qty 1

## 2014-04-23 MED ORDER — SODIUM CHLORIDE 0.9 % IV SOLN
100.0000 mg | Freq: Two times a day (BID) | INTRAVENOUS | Status: DC
  Administered 2014-04-24 (×2): 100 mg via INTRAVENOUS
  Filled 2014-04-23 (×6): qty 10

## 2014-04-23 MED ORDER — LEVETIRACETAM IN NACL 500 MG/100ML IV SOLN
500.0000 mg | Freq: Two times a day (BID) | INTRAVENOUS | Status: DC
  Filled 2014-04-23: qty 100

## 2014-04-23 MED ORDER — LORAZEPAM 2 MG/ML IJ SOLN
2.0000 mg | Freq: Once | INTRAMUSCULAR | Status: AC
  Administered 2014-04-23: 2 mg via INTRAVENOUS
  Filled 2014-04-23: qty 1

## 2014-04-23 MED ORDER — LISINOPRIL 10 MG PO TABS
10.0000 mg | ORAL_TABLET | Freq: Every day | ORAL | Status: DC
  Filled 2014-04-23: qty 1

## 2014-04-23 MED ORDER — ATORVASTATIN CALCIUM 80 MG PO TABS
80.0000 mg | ORAL_TABLET | Freq: Every day | ORAL | Status: DC
  Filled 2014-04-23 (×2): qty 1

## 2014-04-23 MED ORDER — SODIUM CHLORIDE 0.9 % IJ SOLN
3.0000 mL | Freq: Two times a day (BID) | INTRAMUSCULAR | Status: DC
  Administered 2014-04-24 (×2): 3 mL via INTRAVENOUS

## 2014-04-23 MED ORDER — LEVETIRACETAM IN NACL 1000 MG/100ML IV SOLN
1000.0000 mg | INTRAVENOUS | Status: AC
  Administered 2014-04-23: 1000 mg via INTRAVENOUS
  Filled 2014-04-23: qty 100

## 2014-04-23 MED ORDER — CHLORTHALIDONE 25 MG PO TABS
25.0000 mg | ORAL_TABLET | Freq: Every day | ORAL | Status: DC
  Filled 2014-04-23: qty 1

## 2014-04-23 MED ORDER — LACOSAMIDE 200 MG/20ML IV SOLN
200.0000 mg | Freq: Once | INTRAVENOUS | Status: AC
  Administered 2014-04-23: 200 mg via INTRAVENOUS
  Filled 2014-04-23: qty 20

## 2014-04-23 MED ORDER — DONEPEZIL HCL 10 MG PO TABS
10.0000 mg | ORAL_TABLET | Freq: Every day | ORAL | Status: DC
  Administered 2014-04-25 – 2014-04-29 (×5): 10 mg via ORAL
  Filled 2014-04-23 (×7): qty 1

## 2014-04-23 NOTE — ED Notes (Signed)
hospitalist at bedside

## 2014-04-23 NOTE — ED Notes (Signed)
Dr. Roseanne RenoStewart and hospitalist at bedside to discuss CT imaging with placement of EEG leads on pt head.

## 2014-04-23 NOTE — ED Notes (Signed)
Charge RN notified that pt cannot be transported until EEG tech arrives to transport pt so that recording will not be interrupted.

## 2014-04-23 NOTE — Progress Notes (Signed)
LTM day 1 started 

## 2014-04-23 NOTE — Progress Notes (Signed)
EEG completed, results pending. 

## 2014-04-23 NOTE — Consult Note (Signed)
NEURO HOSPITALIST CONSULT NOTE    Reason for Consult: Seizure  HPI:                                                                                                                                          Margaret Hernandez is an 80 y.o. female who is followed by Dr. Karel JarvisAquino as out patient for episodes of AMS which only occur on the weekends. Family is poor historian and much of past history was obtained from chart. "Since May 2015, she had been having episodes that only occur during the weekend, where she would sleep on and off from Saturday to Monday, refusing to drink, eat, or bathe. Her daughter reports it is like having a child, "she won't do anything," closing her mouth shut when fed food. She looks at family like she doesn't recognize them. [name] states she has no recollection of this. Her daughter reports this is a change in her personality. Then starting Tuesday until the weekend, she is back to baseline, with good appetite, awake and cooperative.  She has had visual hallucinations over the past year or so where she would see a black snake in her room. She has called her  neighbor and has woken her daughter up at 2am reporting seeing the snake. "  Pateint has had MRI brain on 01-14-2014 which showed no acute abnormalities and previous routine EEG had only shown mild diffuse slowing and rare focal right temporal slowing with no epileptiform discharges.  Per last note she was to have a ambulatory EEG on 05/01/2014.  Today her daughter heard her fall and found her on the floor foaming at the mouth, jerking in both UE and LE. She was both urine and fecal incontinent. Post event she was non-vocal and would not follow commands.  She has had no further seizures while in ED and will not participate in exam.   Past Medical History  Diagnosis Date  . Diabetes mellitus without complication   . Hypertension   . Dementia     Past Surgical History  Procedure Laterality Date  .  Shoulder surgery    . Abdominal hysterectomy      Family History  Problem Relation Age of Onset  . Hypertension Mother   . Hypertension Father      Social History:  reports that she has never smoked. She does not have any smokeless tobacco history on file. She reports that she does not drink alcohol or use illicit drugs.  No Known Allergies  MEDICATIONS:  No current facility-administered medications for this encounter.   Current Outpatient Prescriptions  Medication Sig Dispense Refill  . alendronate (FOSAMAX) 70 MG tablet Take 70 mg by mouth.    Marland Kitchen. amLODipine (NORVASC) 5 MG tablet Take 5 mg by mouth daily.    . chlorthalidone (HYGROTON) 25 MG tablet Take 25 mg by mouth daily.    . clopidogrel (PLAVIX) 75 MG tablet Take 75 mg by mouth daily with breakfast.    . donepezil (ARICEPT) 10 MG tablet Take 10 mg by mouth at bedtime.    . gabapentin (NEURONTIN) 400 MG capsule Take 400 mg by mouth 3 (three) times daily.    Marland Kitchen. glimepiride (AMARYL) 2 MG tablet Take 2 mg by mouth daily with breakfast.    . lisinopril (PRINIVIL,ZESTRIL) 20 MG tablet Take 20 mg by mouth daily.        ROS:                                                                                                                                       History obtained from unobtainable from patient due to mental status   Blood pressure 137/55, pulse 86, temperature 98.9 F (37.2 C), temperature source Rectal, resp. rate 16, SpO2 98 %.   Neurologic Examination:                                                                                                       Neuro Exam: Mental Status: Arouses to noxious stimuli but localizing and attempting to grab my hand. Will not follow commands or talk.  When attempting to look into eyes holds eyelids clenched.  Cranial Nerves: II: Discs not able to visualize; blinks  to threat bilaterally, pupils equal, round, reactive to light and accommodation III,IV, VI: attempts to hold eye lids shut against resistance.  extra-ocular motions intact bilaterally V,VII: face symmetric, winces to noxious stimuli bilatearlly VIII: will look toward voice IX,X: gag reflex present XI: bilateral shoulder shrug XII: holds mouth tightly shut  Motor: Moves all extremities antigravity both spontaneously and purposefully Sensory: withdraws from pain bilaterally Deep Tendon Reflexes:  Right: Upper Extremity   Left: Upper extremity   biceps (C-5 to C-6) 2/4   biceps (C-5 to C-6) 2/4 tricep (C7) 2/4    triceps (C7) 2/4 Brachioradialis (C6) 2/4  Brachioradialis (C6) 2/4  Lower Extremity Lower Extremity  quadriceps (L-2 to L-4) 1/4   quadriceps (L-2 to L-4) 1/4 Achilles (S1) 0/4   Achilles (S1) 0/4  Plantars: Right: downgoing   Left: downgoing Cerebellar: Unable to obtain Gait: not tested due to mental status CV: pulses palpable throughout    Lab Results: Basic Metabolic Panel:  Recent Labs Lab 04/23/14 1402  NA 142  K 3.5*  CL 96  CO2 24  GLUCOSE 153*  BUN 21  CREATININE 1.12*  CALCIUM 9.7    Liver Function Tests:  Recent Labs Lab 04/23/14 1402  AST 25  ALT 13  ALKPHOS 76  BILITOT 0.6  PROT 7.5  ALBUMIN 3.7   No results for input(s): LIPASE, AMYLASE in the last 168 hours. No results for input(s): AMMONIA in the last 168 hours.  CBC:  Recent Labs Lab 04/23/14 1402  WBC 10.0  NEUTROABS 7.7  HGB 11.7*  HCT 36.5  MCV 76.8*  PLT 263    Cardiac Enzymes: No results for input(s): CKTOTAL, CKMB, CKMBINDEX, TROPONINI in the last 168 hours.  Lipid Panel: No results for input(s): CHOL, TRIG, HDL, CHOLHDL, VLDL, LDLCALC in the last 168 hours.  CBG:  Recent Labs Lab 04/23/14 1349  GLUCAP 139*    Microbiology: Results for orders placed or performed during the hospital encounter of 04/29/13  Urine culture     Status: None   Collection  Time: 04/29/13  3:20 PM  Result Value Ref Range Status   Specimen Description URINE, CATHETERIZED  Final   Special Requests NONE  Final   Culture  Setup Time   Final    04/30/2013 01:33 Performed at [facility]   Colony Count NO GROWTH Performed at [facility]  Final   Culture NO GROWTH Performed at [facility]  Final   Report Status 05/01/2013 FINAL  Final    Coagulation Studies: No results for input(s): LABPROT, INR in the last 72 hours.  Imaging: No results found.     Assessment and plan per attending neurologist  Felicie Morn PA-C [facility] [phone]  04/23/2014, 4:41 PM   Assessment/Plan: 80 YO female who is closely followed out patient by Dr. Karel Jarvis for periods of AMS which occur only on the weekends. Today patient had a fall followed by 2 minute period of jerking, foaming at the mouth and incontinence. Exam is limited by patients refusal to participate but shows no focal abnormalities.  Seizure cannot be excluded. CT head pending.   Recommend: 1) EEG 2) MRI brain with and without contrast.  3) will make decision on starting AED based on EEG   Neurology attending: Patient seen and examined together with physician assistant. EEG showed generalized periodic epileptiform discharges that are of higher amplitude over the right temporal-central-parietal region and at times seems to acquire a rhythmic pattern. Will load with 1 gram IV keppra and give additional 2 mg IV lorazepam. Continue IV keppra 500 mg BID starting tomorrow. c-EEG monitoring.   Wyatt Portela, MD

## 2014-04-23 NOTE — ED Notes (Signed)
EEG tech on call paged [place]

## 2014-04-23 NOTE — ED Notes (Signed)
Per EMS: pt was found by neighbor who hurt a loud thud and came in to check.  At the time she was seizing, full body clonic-tonic.  When EMS arrived she was unresponsive, SpO2 at 83%.  She was placed on NRB until 100% was reached.    Pt has no history of seizures.  EMS attempted Narcan with no distinct change in condition.  12 lead unremarkable.

## 2014-04-23 NOTE — ED Provider Notes (Signed)
CSN: [id number]     Arrival date & time 04/23/14  1305 History   First MD Initiated Contact with Patient 04/23/14 1519     Chief Complaint  Patient presents with  . Seizures      HPI Patient is an 80 year old female who has a history of dementia, that is currently being worked up by [facility], who presents today following a seizure. Patient's family members serve as historian. (LEVEL V CAVEAT 2/2 Unresponsiveness) At approximately 1:30 this afternoon she had a witnessed seizure, that they described as full body shaking, and unresponsiveness, and foaming at the mouth. They said this episode lasted 2-3 minutes. Family members report that she has a remote history of seizures during a time when she was using alcohol heavily, but none since. She was postictal following the seizure, and EMS was called, who then transported her here.    They also report that she has been generally not acting like herself for the past 3 days, refusing to talk E or drink. Apparently this has been going on for several months, but only on the weekends. The is being evaluated by our neurology, who plans to do an EEG on December 2.  Past Medical History  Diagnosis Date  . Diabetes mellitus without complication   . Hypertension   . Dementia    Past Surgical History  Procedure Laterality Date  . Shoulder surgery    . Abdominal hysterectomy     Family History  Problem Relation Age of Onset  . Hypertension Mother   . Hypertension Father    History  Substance Use Topics  . Smoking status: Never Smoker   . Smokeless tobacco: Not on file  . Alcohol Use: No   OB History    No data available     Review of Systems  Unable to perform ROS: Mental status change      Allergies  Review of patient's allergies indicates no known allergies.  Home Medications   Prior to Admission medications   Medication Sig Start Date End Date Taking? Authorizing Provider  amLODipine (NORVASC) 5 MG tablet Take 5 mg by  mouth daily.   Yes Historical Provider, MD  atorvastatin (LIPITOR) 80 MG tablet Take 80 mg by mouth daily.   Yes Historical Provider, MD  chlorthalidone (HYGROTON) 25 MG tablet Take 25 mg by mouth daily.   Yes Historical Provider, MD  Cholecalciferol (VITAMIN D3) 50000 UNITS CAPS Take 1 capsule by mouth once a week. Once weekly for 13 weeks   Yes Historical Provider, MD  Cholecalciferol 1000 UNITS capsule Take 1,000 Units by mouth daily.   Yes Historical Provider, MD  clopidogrel (PLAVIX) 75 MG tablet Take 75 mg by mouth daily with breakfast.   Yes Historical Provider, MD  donepezil (ARICEPT) 10 MG tablet Take 10 mg by mouth at bedtime.   Yes Historical Provider, MD  gabapentin (NEURONTIN) 100 MG capsule Take 200 mg by mouth 2 (two) times daily.   Yes Historical Provider, MD  hydrocortisone cream 1 % Apply 1 application topically 2 (two) times daily as needed for itching (rash on arms).   Yes Historical Provider, MD  lisinopril (PRINIVIL,ZESTRIL) 10 MG tablet Take 10 mg by mouth daily.   Yes Historical Provider, MD   BP 131/75 mmHg  Pulse 79  Temp(Src) 98.9 F (37.2 C) (Rectal)  Resp 20  SpO2 100% Physical Exam  Constitutional:  Elderly female lying in bed in no acute distress  HENT:  Head: Normocephalic and atraumatic.  Right  Ear: External ear normal.  Left Ear: External ear normal.  Eyes: Conjunctivae are normal. Pupils are equal, round, and reactive to light.  Refuses to open her eyes, unable to assess extraocular movements  Neck: Neck supple.  No deformity of the cervical spine  Cardiovascular: Normal rate, regular rhythm and normal heart sounds.   Pulmonary/Chest: Effort normal and breath sounds normal. No respiratory distress.  Abdominal: Soft. She exhibits no distension.  Neurological:  Responsive to verbal stimuli only, will not follow commands.  Skin: Skin is warm and dry.  Nursing note and vitals reviewed.   ED Course  Procedures (including critical care time) Labs  Review Labs Reviewed  CBC WITH DIFFERENTIAL - Abnormal; Notable for the following:    Hemoglobin 11.7 (*)    MCV 76.8 (*)    MCH 24.6 (*)    Neutrophils Relative % 78 (*)    All other components within normal limits  BASIC METABOLIC PANEL - Abnormal; Notable for the following:    Potassium 3.5 (*)    Glucose, Bld 153 (*)    Creatinine, Ser 1.12 (*)    GFR calc non Af Amer 44 (*)    GFR calc Af Amer 52 (*)    Anion gap 22 (*)    All other components within normal limits  URINALYSIS, ROUTINE W REFLEX MICROSCOPIC - Abnormal; Notable for the following:    Hgb urine dipstick SMALL (*)    Bilirubin Urine SMALL (*)    Ketones, ur 15 (*)    All other components within normal limits  CBG MONITORING, ED - Abnormal; Notable for the following:    Glucose-Capillary 139 (*)    All other components within normal limits  HEPATIC FUNCTION PANEL  URINE RAPID DRUG SCREEN (HOSP PERFORMED)  MAGNESIUM  PHOSPHORUS  URINE MICROSCOPIC-ADD ON  CBC  BASIC METABOLIC PANEL  CBG MONITORING, ED    Imaging Review Ct Cervical Spine Wo Contrast  04/23/2014   CLINICAL DATA:  Seizures, status post fall  EXAM: CT CERVICAL SPINE WITHOUT CONTRAST  TECHNIQUE: Multidetector CT imaging of the cervical spine was performed without intravenous contrast. Multiplanar CT image reconstructions were also generated.  COMPARISON:  None.  FINDINGS: The alignment is anatomic. The vertebral body heights are maintained. There is no acute fracture. There is no static listhesis. The prevertebral soft tissues are normal. The intraspinal soft tissues are not fully imaged on this examination due to poor soft tissue contrast, but there is no gross soft tissue abnormality.  There is degenerative disc disease at C4-5, C5-6 and C6-7. There are broad-based disc osteophyte complexes at C4-5 and C5-6 with bilateral facet arthropathy and uncovertebral degenerative changes resulting in foraminal encroachment. There is a mild broad-based disc  osteophyte complex at C6-7.  The visualized portions of the lung apices demonstrate no focal abnormality.  IMPRESSION: 1. No acute osseous injury of the cervical spine.   Electronically Signed   By: Elige KoHetal  Patel   On: 04/23/2014 19:49    EKG with sinus rhythm, rate 97, T-wave abnormalities, borderline   MDM   Final diagnoses:  Fall due to seizure  Seizure   80 year old female with new onset seizure. Witnessed seizure activity appeared to be tonic-clonic. No seizure activity upon arrival here. Nonfocal neurologic exam, however patient will not participate. He will localize to pain, and open eyes to speech intermittently. Neurology was consulted, and performed a spot EEG at the bedside that was concerning for status epilepticus, which did not resolve with IV Ativan.  Patient was loaded with antiepileptics, and now is able to follow simple commands. Unable to get CT of the head initially because of the EKG leads, no EEG tech available. Discussed with Dr. Julian Reil, hospitalist who will be admitting the patient, and developed a plan for obtaining CT once tech has arrived.  We'll plan to admit to hospitalist, stepdown. We will get MRI in the morning.  Erskine Emery, MD 04/24/14 0120  ATTENDING ADDENDUM  This patient was seen in conjunction with the resident physician. The documentation accurately reflects the patient's ED evaluation.  On my exam, the patient was initially unresponsive, not interacting appropriately, with seizure-like activity. Initial history was difficult to obtain due to the patient's unresponsiveness. However, it seems as though the patient has known dementia, but no history of seizures. After the initial evaluation the patient received Ativan, with no change in her condition. With ongoing seizure activity we discussed the patient's case with our neurology team. Given the lack of change with Ativan, patient received IV antiepileptics, and initially was continuing to exhibit  seizure-like activity. We arranged for emergent emergency department EEG. This demonstrated ongoing seizure activity. Given the patient's requirement for continuous EEG, either MRI or CT was feasible due to artifact concerns. With new onset status epilepticus, which only improved after several hours in the emergency department, patient required admission for further evaluation and management.  CRITICAL CARE Performed by: Gerhard Munch Total critical care time: 40 Critical care time was exclusive of separately billable procedures and treating other patients. Critical care was necessary to treat or prevent imminent or life-threatening deterioration. Critical care was time spent personally by me on the following activities: development of treatment plan with patient and/or surrogate as well as nursing, discussions with consultants, evaluation of patient's response to treatment, examination of patient, obtaining history from patient or surrogate, ordering and performing treatments and interventions, ordering and review of laboratory studies, ordering and review of radiographic studies, pulse oximetry and re-evaluation of patient's condition.    Gerhard Munch, MD 04/29/14 [phone]

## 2014-04-23 NOTE — H&P (Signed)
[facility] History and Physical  Margaret Hernandez XBJ:[id number] DOB: 01/24/1930 DOA: 04/23/2014  Referring physician: EDP PCP: Dennison MascotMORRISEY,LEMONT, MD   Chief Complaint: Status epilepticus   HPI: Margaret Hernandez is a 80 y.o. female brought in to ED by family from NH after daughter heard a scream and a thump.  Found patient on floor jerking both UE, foaming at mouth, urine and fecal incontinence.  Patient had collapsed and was brought in to the ED where it was determined she was in status epilepticus by EEG.  This has been refractory to 2mg  ativan.  The history prior to this is very odd and does not at all correlate with her current status epilepticus.  Family reports that she has been having odd episodes "only on weekends" since May of 2015.  Will sleep from Saturday to Monday refusing to eat, drink, or bathe.  Personality changes.  From Tuesday till the weekend she would be back to baseline family reports.  While she has a history of seizures, these were 30 years ago and she hasnt been on seizure meds since that time.  Review of Systems: Systems reviewed.  As above, otherwise negative  Past Medical History  Diagnosis Date  . Diabetes mellitus without complication   . Hypertension   . Dementia    Past Surgical History  Procedure Laterality Date  . Shoulder surgery    . Abdominal hysterectomy     Social History:  reports that she has never smoked. She does not have any smokeless tobacco history on file. She reports that she does not drink alcohol or use illicit drugs.  No Known Allergies  Family History  Problem Relation Age of Onset  . Hypertension Mother   . Hypertension Father      Prior to Admission medications   Medication Sig Start Date End Date Taking? Authorizing Provider  amLODipine (NORVASC) 5 MG tablet Take 5 mg by mouth daily.   Yes Historical Provider, MD  atorvastatin (LIPITOR) 80 MG tablet Take 80 mg by mouth daily.   Yes Historical Provider, MD   chlorthalidone (HYGROTON) 25 MG tablet Take 25 mg by mouth daily.   Yes Historical Provider, MD  Cholecalciferol (VITAMIN D3) 50000 UNITS CAPS Take 1 capsule by mouth once a week. Once weekly for 13 weeks   Yes Historical Provider, MD  Cholecalciferol 1000 UNITS capsule Take 1,000 Units by mouth daily.   Yes Historical Provider, MD  clopidogrel (PLAVIX) 75 MG tablet Take 75 mg by mouth daily with breakfast.   Yes Historical Provider, MD  donepezil (ARICEPT) 10 MG tablet Take 10 mg by mouth at bedtime.   Yes Historical Provider, MD  gabapentin (NEURONTIN) 100 MG capsule Take 200 mg by mouth 2 (two) times daily.   Yes Historical Provider, MD  hydrocortisone cream 1 % Apply 1 application topically 2 (two) times daily as needed for itching (rash on arms).   Yes Historical Provider, MD  lisinopril (PRINIVIL,ZESTRIL) 10 MG tablet Take 10 mg by mouth daily.   Yes Historical Provider, MD   Physical Exam: Filed Vitals:   04/23/14 1940  BP: 157/70  Pulse: 83  Temp:   Resp: 20    BP 157/70 mmHg  Pulse 83  Temp(Src) 98.9 F (37.2 C) (Rectal)  Resp 20  SpO2 100%  General Appearance:    Lethargic, does follow commands later during exam period when she is not seizing, appears stated age  Head:    Normocephalic, atraumatic  Eyes:    PERRL, EOMI,  sclera non-icteric        Nose:   Nares without drainage or epistaxis. Mucosa, turbinates normal  Throat:   Moist mucous membranes. Oropharynx without erythema or exudate.  Neck:   Supple. No carotid bruits.  No thyromegaly.  No lymphadenopathy.   Back:     No CVA tenderness, no spinal tenderness  Lungs:     Clear to auscultation bilaterally, without wheezes, rhonchi or rales  Chest wall:    No tenderness to palpitation  Heart:    Regular rate and rhythm without murmurs, gallops, rubs  Abdomen:     Soft, non-tender, nondistended, normal bowel sounds, no organomegaly  Genitalia:    deferred  Rectal:    deferred  Extremities:   No clubbing, cyanosis or  edema.  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 No results for input(s): AMMONIA in the last 168 hours. CBC:  Recent Labs Lab 04/23/14 1402  WBC 10.0  NEUTROABS 7.7  HGB 11.7*  HCT 36.5  MCV 76.8*  PLT 263   Cardiac Enzymes: No results for input(s): CKTOTAL, CKMB, CKMBINDEX, TROPONINI in the last 168 hours.  BNP (last 3 results) No results for input(s): PROBNP in the last 8760 hours. CBG:  Recent Labs Lab 04/23/14 1349  GLUCAP 139*    Radiological Exams on Admission: Ct Cervical Spine Wo Contrast  04/23/2014   CLINICAL DATA:  Seizures, status post fall  EXAM: CT CERVICAL SPINE WITHOUT CONTRAST  TECHNIQUE: Multidetector CT imaging of the cervical spine was performed without intravenous contrast. Multiplanar CT image reconstructions were also generated.  COMPARISON:  None.  FINDINGS: The alignment is anatomic. The vertebral body heights are maintained. There is no acute fracture. There is no static listhesis. The prevertebral soft tissues are normal. The intraspinal soft tissues are not fully imaged on this examination due to poor soft tissue contrast, but there is no gross soft tissue abnormality.  There is degenerative disc disease at C4-5, C5-6 and C6-7. There are broad-based disc osteophyte complexes at  C4-5 and C5-6 with bilateral facet arthropathy and uncovertebral degenerative changes resulting in foraminal encroachment. There is a mild broad-based disc osteophyte complex at C6-7.  The visualized portions of the lung apices demonstrate no focal abnormality.  IMPRESSION: 1. No acute osseous injury of the cervical spine.   Electronically Signed   By: Elige Ko   On: 04/23/2014 19:49    EKG: Independently reviewed.  Assessment/Plan Principal Problem:   Status epilepticus Active Problems:   Status epilepticus, generalized convulsive   1. Status epilepticus -  1. has gotten 2mg  ativan,  1gm keppra load and 500mg  Q12H keppra, Dr. Roseanne RenoStewart is having me add 200mg  vimpat load and 100mg  Q12H vimpat. 2. Continuous EEG monitoring 3. Needs CT head ASAP, this likely will be done as soon as EEG tech comes in, (needs to come in to move patient up stairs from ED anyhow) to replace leads. 4. Holding all blood thinners for the moment until CT head performed 5. MRI brain also to be done tomorrow. 6. Seizure precautions 7. Patient is following commands right now so seizure meds appear to be having some effect 8. Is guarding airway for the moment, is high risk to require intubation if status reoccurs. 2. HTN - continue home meds, may need PRN while NPO   Code Status: Full Code  Family Communication: Family at bedside Disposition Plan: Admit to inpatient   Time spent: 70 min  Cato Liburd M. [facility] Pager [phone]  If 7AM-7PM, please contact the day team taking care of the patient [email or web address] Password [email or web address] 04/23/2014, 9:29 PM

## 2014-04-23 NOTE — ED Notes (Addendum)
Family sts pt is typically verbal.   PT DOES HAVE HX OF SEIZURES, and has episodes of unresponsiveness, will not eat or drink or take her medications.  Pt unable to care for herself during episodes, including incontinence.  When of "sound mind" pt able to feed, cloth, bathe, and use the restroom appropriately.  Pt has not had a witnessed seizure in "over a year" according to grandson.

## 2014-04-23 NOTE — Procedures (Addendum)
CONTINUOUS VIDEO-EEG (24 HR) REPORT  Patient: Margaret Hernandez      Room #: Z61 Age: 80 y.o.        Sex: female Referring Physician: Jeraldine Loots Date of recording: 11/26-27/2015  History: The patient is an 80 year-old female with episodes of altered mental status. The initial EEG was interpreted as consistent with generalized status epilepticus; the EEG patter was not affected by 2 mg Ativan IV. This is day 3 with subsequent 24 hr continuous video-EEG recording.  Scheduled Meds: . antiseptic oral rinse  7 mL Mouth Rinse BID  . atorvastatin  80 mg Oral Q2000  . [START ON 04/27/2014] clopidogrel  75 mg Oral Q breakfast  . donepezil  10 mg Oral QHS  . insulin aspart  0-9 Units Subcutaneous TID WC  . lacosamide (VIMPAT) IV  150 mg Intravenous Q12H  . levETIRAcetam  1,000 mg Intravenous Q12H  . potassium chloride  40 mEq Oral Once  . valproate sodium  500 mg Intravenous 3 times per day   Continuous Infusions: . sodium chloride 75 mL/hr at 04/26/14 1036    Technical Description:  The study consists of a continuous digital video EEG recording, with 21 electrodes placed according to the International 10-20 System. Additional leads included an EKG lead. Activation procedures (i.e. photic stimulation, hyperventilation) were not included. The study was reviewed in multiple montages.  Electrographic Description:  The recording while awake is characterized by mild-moderate diffuse slowing (low voltage polymorphic theta with some intermixed delta) and mild slowing of the posterior rhythm (20-40 uV, 6.5-7.0 Hz) with occasional to frequent shorter runs of triphasic activity, frontally predominant, with an average frequency of about 1.5 Hz - 2 Hz. These runs vary in length from several second to less than one minute, averaging ~ 5-10 seconds.  Overall, the stretches of relatively normal if slow wake background occupy 85% of the recording now, and more so toward the end of the recording. The triphasic activity  is much less prominent. As before, the triphasic waves is noted only while awake or drowsy, and resolves during sleep.  As before, in the bipolar montage there are sometimes minor asymmetries but none were persistent, with occasional spike-like phase-reversals over F3 and/or F4, but no definitive epileptic discharges.  Interpretation: This is an abnormal 24 hr continuous video-EEG recording. Significant findings include: 1.  Mild-moderate diffuse background slowing, low voltage theta/delta  2.  Occasional short runs of generalized monomorphic triphasic activity while awake, 1.5-2 Hz, frontally predominant  3.  Mild slowing of the posterior rhythm at 6.5-7 Hz  Clinical Correlation: The findings indicate a mild-moderate diffuse encephalopathy with substantial improvement over the last 24 hrs. While they do not specify an etiology, the findings would be most consistent with a toxic/metabolic derangement (e.g. elevated LFTs and/or elevated ammonia, sepsis, other organ failure, etc.) Again, no ongoing seizure activity was observed that would explain the altered mental status.   INTERPRETING MD: [name], MD DATE: 04/26/2014 TIME: 12:32 PM     CONTINUOUS VIDEO-EEG (24 HR) REPORT  Patient: Margaret Hernandez      Room #: D35 Age: 80 y.o.        Sex: female Referring Physician: Jeraldine Loots Date of recording: 11/25-26/2015  History: The patient is an 80 year-old female with episodes of altered mental status. The initial EEG was interpreted as consistent with generalized status epilepticus; the EEG patter was not affected by 2 mg Ativan IV. This is day 2 with subsequent 24 hr continuous video-EEG recording.  Scheduled Meds: . antiseptic oral rinse  7 mL Mouth Rinse BID  . donepezil  10 mg Oral QHS  . lacosamide (VIMPAT) IV  150 mg Intravenous Q12H  . levETIRAcetam  1,000 mg Intravenous Q12H  . sodium chloride  3 mL Intravenous Q12H  . valproate sodium  500 mg Intravenous 3 times per day    Technical Description:  The study consists of a continuous digital video EEG recording, with 21 electrodes placed according to the International 10-20 System. Additional leads included an EKG lead. Activation procedures (i.e. photic stimulation, hyperventilation) were not included. The study was reviewed in multiple montages.  Electrographic Description:  Recording while awake is characterized by frequent runs of triphasic activity, frontally predominant, with an average frequency of about 1.5 Hz - 2 Hz. These runs vary in length from several second to several minutes, but probably average 5-20 seconds. Of note, today, the runs are shorter today with longer stretches of relatively normal, if slow, awake background activity (low voltage diffuse theta with a fairly consistent 5-7 Hz symmetric posterior rhythm). Further, the triphasic waves occur predominantly while awake and resolve during sleep.  As before, in the bipolar montage there are sometimes minor asymmetries, with occasional spike-like phase-reversals over F3 and/or F4. However, in a referential montage (i.e. ipsilateral ear reference), the discharges show a consistent  triphasic morphology with a higher amplitude positive phase with a typical anterior to posterior lag. Further, there is no significant evolution to suggest ongoing seizure activity.  Interpretation: This is an abnormal 24 hr continuous video-EEG recording. Significant findings include: 1.  Frequent shorter runs of generalized monomorphic triphasic activity while awake, 2 Hz, frontally predominant 2.  Mild-moderate diffuse background slowing, low voltage theta 3.  Slowing of the posterior rhythm at 5-7 Hz  Clinical Correlation: The findings indicate a moderate diffuse encephalopathy and while they do not specify an etiology, they would be most consistent with a toxic/metabolic derangement (e.g. Elevated LFTs and/or elevated ammonia, sepsis, other organ failure, etc.) There is some  definite improvement of the encephalopathy over the last 24 hrs. Again, no ongoing seizure activity was observed that would explain the altered mental status.   INTERPRETING MD: Tyna JakschMAYES, Brya Simerly N, MD DATE: 04/25/2014  TIME: 12:18 PM    CONTINUOUS VIDEO-EEG (24 HR) REPORT  Patient: Margaret Hernandez      Room #: D35 Age: 80 y.o.        Sex: female Referring Physician: Jeraldine LootsLockwood Date of recording: 11/24-25/2015  History: The patient is an 80 year-old female with episodes of altered mental status. The initial EEG was interpreted as consistent with generalized status epilepticus; the EEG patter was not affected by 2 mg Ativan IV. This is a subsequent 24 hr continuous video-EEG recording.  Scheduled Meds: . amLODipine  5 mg Oral Daily  . atorvastatin  80 mg Oral Daily  . chlorthalidone  25 mg Oral Daily  . donepezil  10 mg Oral QHS  . gabapentin  200 mg Oral BID  . lacosamide (VIMPAT) IV  100 mg Intravenous Q12H  . levETIRAcetam  1,000 mg Intravenous Q12H  . lisinopril  10 mg Oral Daily  . potassium chloride  10 mEq Intravenous Q1 Hr x 3  . sodium chloride  3 mL Intravenous Q12H   Technical Description:  The study consists of a continuous digital video EEG recording, with 21 electrodes placed according to the International 10-20 System. Additional leads included an EKG lead. Activation procedures (i.e. photic stimulation, hyperventilation) were not included.  The study was reviewed in multiple montages.  Electrographic Description:  Recording is characterized by near continuous triphasic activity, frontally predominant, with an average frequency of about 2 Hz.  In the bipolar montage there are some times minor asymmetries, with occasional spike-like phase-reversals over F3 and/or F4. However, in a referential montage (i.e. Ipsilateral ear reference), the discharges show a consistent  triphasic morphology with a higher amplitude positive phase with a typical anterior to posterior lag. Further, there  is no significant evolution to suggest ongoing seizure activity, and the background in between runs of triphasic activity includes a recognizable if slow 6-7 Hz posterior rhythm.  Interpretation: This is an abnormal 24 hr continuous video-EEG recording. Significant findings include: 1.  Near continuous triphasic activity, 2 Hz, frontally predominant 2.  Mild-moderate diffuse background slowing 3.  Slowing of the posterior rhythm at 6-7 Hz  Clinical Correlation: The findings best support a diagnosis of moderate diffuse encephalopathy and suggest a toxic/metabolic derangement. The EEG pattern is a little ambiguous, but many factors argue against it representing ongoing non-convulsive seizure, though these patients are certainly at risk for that. Further clinical correlation recommended.  INTERPRETING MD: Tyna JakschMAYES, Ahmiya Abee N, MD DATE: 04/24/2014   TIME: 12:23 PM    ELECTROENCEPHALOGRAM REPORT   Patient: Margaret Hernandez       Room #: D35 EEG No. ID: [id number] Age: 80 y.o.        Sex: female Referring Physician: Jeraldine LootsLockwood Report Date:  04/23/2014        Interpreting Physician: Thana FarrEYNOLDS,LESLIE D  History: Margaret RamusBetty S Sanzo is an 80 y.o. female with episodes of altered mental status  Medications:  Scheduled: . [START ON 04/24/2014] levETIRAcetam  500 mg Intravenous Q12H    Conditions of Recording:  This is a 16 channel EEG carried out with the patient in the confused state.  Description:  The background activity consists of continuous high voltage generalized sharp transients.  These are persistent throughout the recording at a frequency of 2-3 Hertz.  The patient was agitated during the record but no generalized tonic-clonic activity was noted clinically. 2mg  of Ativan were administered during the recording with no change in the epileptiform activity.  The patient does not drowse or sleep. Hyperventilation and intermittent photic stimulation were not performed.   IMPRESSION: This is a markedly  abnormal electroencephalogram secondary to persistent generalized epileptiform activity consistent with generalized status epilepticus.  This activity was not aborted by 2mg  of Ativan.     Dr. Leroy Kennedyamilo aware   Thana FarrLeslie Reynolds, MD [facility] [phone] 04/23/2014, 7:11 PM

## 2014-04-23 NOTE — ED Notes (Signed)
Lab called to verify add on of hepatic function blood work.

## 2014-04-23 NOTE — ED Notes (Signed)
CT called for update on pt CT head and this RN was told that due to EEG leads being placed on pt head a CT head would not be performed due to imaging quality of leads, RN was informed that Dr. Jeraldine LootsLockwood was made aware and neuro was made aware. Pt family updated.

## 2014-04-23 NOTE — ED Notes (Signed)
EEG tech paged to come and take leads off of the pt head, [place] and will have to drive down to take leads off so that pt can have CT head done and also to be transported upstairs when a room is assigned. Dr. Julian ReilGardner and Dr. Roseanne RenoStewart made aware. Pt family updated.

## 2014-04-23 NOTE — ED Notes (Signed)
CT collar removed per Dr. Jeraldine LootsLockwood instructions, hospitalist at bedside discussing need for CT of head and also plan of care with pt son and daughter.

## 2014-04-24 ENCOUNTER — Inpatient Hospital Stay (HOSPITAL_COMMUNITY): Payer: Medicare (Managed Care)

## 2014-04-24 DIAGNOSIS — G40301 Generalized idiopathic epilepsy and epileptic syndromes, not intractable, with status epilepticus: Secondary | ICD-10-CM

## 2014-04-24 LAB — CBC
HCT: 33.5 % — ABNORMAL LOW (ref 36.0–46.0)
Hemoglobin: 10.9 g/dL — ABNORMAL LOW (ref 12.0–15.0)
MCH: 24.7 pg — ABNORMAL LOW (ref 26.0–34.0)
MCHC: 32.5 g/dL (ref 30.0–36.0)
MCV: 76 fL — AB (ref 78.0–100.0)
PLATELETS: 256 10*3/uL (ref 150–400)
RBC: 4.41 MIL/uL (ref 3.87–5.11)
RDW: 13.5 % (ref 11.5–15.5)
WBC: 9.6 10*3/uL (ref 4.0–10.5)

## 2014-04-24 LAB — BASIC METABOLIC PANEL
ANION GAP: 16 — AB (ref 5–15)
BUN: 17 mg/dL (ref 6–23)
CHLORIDE: 99 meq/L (ref 96–112)
CO2: 27 mEq/L (ref 19–32)
CREATININE: 1.01 mg/dL (ref 0.50–1.10)
Calcium: 9.3 mg/dL (ref 8.4–10.5)
GFR calc Af Amer: 58 mL/min — ABNORMAL LOW (ref >90)
GFR calc non Af Amer: 50 mL/min — ABNORMAL LOW (ref >90)
Glucose, Bld: 103 mg/dL — ABNORMAL HIGH (ref 70–99)
Potassium: 3.1 mEq/L — ABNORMAL LOW (ref 3.7–5.3)
SODIUM: 142 meq/L (ref 137–147)

## 2014-04-24 LAB — MRSA PCR SCREENING: MRSA BY PCR: NEGATIVE

## 2014-04-24 LAB — GLUCOSE, CAPILLARY: Glucose-Capillary: 114 mg/dL — ABNORMAL HIGH (ref 70–99)

## 2014-04-24 MED ORDER — VALPROATE SODIUM 500 MG/5ML IV SOLN
1000.0000 mg | Freq: Once | INTRAVENOUS | Status: AC
  Administered 2014-04-24: 1000 mg via INTRAVENOUS
  Filled 2014-04-24: qty 10

## 2014-04-24 MED ORDER — LEVETIRACETAM IN NACL 1000 MG/100ML IV SOLN
1000.0000 mg | Freq: Two times a day (BID) | INTRAVENOUS | Status: DC
  Administered 2014-04-24 – 2014-04-29 (×11): 1000 mg via INTRAVENOUS
  Filled 2014-04-24 (×13): qty 100

## 2014-04-24 MED ORDER — VALPROATE SODIUM 500 MG/5ML IV SOLN
1.0000 g | INTRAVENOUS | Status: AC
  Administered 2014-04-24: 1000 mg via INTRAVENOUS
  Filled 2014-04-24: qty 10

## 2014-04-24 MED ORDER — GADOBENATE DIMEGLUMINE 529 MG/ML IV SOLN
15.0000 mL | Freq: Once | INTRAVENOUS | Status: AC | PRN
  Administered 2014-04-24: 15 mL via INTRAVENOUS

## 2014-04-24 MED ORDER — POTASSIUM CHLORIDE 10 MEQ/100ML IV SOLN
10.0000 meq | INTRAVENOUS | Status: AC
  Administered 2014-04-24 (×3): 10 meq via INTRAVENOUS
  Filled 2014-04-24 (×3): qty 100

## 2014-04-24 MED ORDER — SODIUM CHLORIDE 0.9 % IV SOLN: 200.0000 mg | INTRAVENOUS | Status: DC

## 2014-04-24 MED ORDER — LEVETIRACETAM IN NACL 1000 MG/100ML IV SOLN: 1000.0000 mg | Freq: Two times a day (BID) | INTRAVENOUS | Status: DC

## 2014-04-24 MED ORDER — LEVETIRACETAM IN NACL 1500 MG/100ML IV SOLN
1500.0000 mg | INTRAVENOUS | Status: AC
  Administered 2014-04-24: 1500 mg via INTRAVENOUS
  Filled 2014-04-24: qty 100

## 2014-04-24 NOTE — Progress Notes (Signed)
EEG unhooked from ED and patient transferred to 3S02 without any problems. All electrodes are intact and LTVM is up and running

## 2014-04-24 NOTE — Progress Notes (Signed)
[facility] so that pt can go down for MRI. Neurologist aware.

## 2014-04-24 NOTE — ED Notes (Signed)
Dr Stewart at bedside.

## 2014-04-24 NOTE — Progress Notes (Signed)
VEEG restarted, family educated, pt event button in place.  Nursing staff informed.  Tech left on-call phone number w/ pt's nurse.

## 2014-04-24 NOTE — Progress Notes (Signed)
Utilization review completed. Stana Bayon, RN, BSN. 

## 2014-04-24 NOTE — Progress Notes (Signed)
Pt lethargic, no verbal responses, unable to follow any commands. PERRLA, VS stable. Neurologist, Dr. Leroy Kennedyamilo aware. No new orders.

## 2014-04-24 NOTE — Progress Notes (Signed)
TEAM 1 - Stepdown/ICU TEAM Progress Note  Alfonso RamusBetty S Dusing WUJ:[id number] DOB: 02/21/1930 DOA: 04/23/2014 PCP: Dennison MascotMORRISEY,LEMONT, MD  Admit HPI / Brief Narrative: 80 yo female brought in to ED by family from NH after daughter heard a scream and a thump. Found patient on floor jerking both UE, foaming at mouth, with urine and fecal incontinence. Patient was brought in to the ED where it was determined she was in status epilepticus by EEG. This was refractory to 2mg  ativan.  While she has a history of seizures, these were 30 years ago and she hasnt been on seizure meds since that time.  Family reports that she has been having "odd episodes only on weekends" since May of 2015. Will sleep from Saturday to Monday refusing to eat, drink, or bathe. Personality changes. From Tuesday till the weekend she would be back to baseline family reports.  HPI/Subjective: Pt is unresponsive at the time of my exam.    Assessment/Plan:  New onset generalized status epilepticus Medication tx as per Neurology - MRI brain pending   DM2 CBG well controlled at this time - does not appear to be on med tx - check A1c  HTN BP poorly controlled - adjust tx and follow trend  Hypokalemia Replace - Mg normal - follow  Dementia   Microcytic anemia  Check Fe studies - follow Hgb   Code Status: FULL Family Communication: spoke w/ daughter at bedside  Disposition Plan: SDU  Consultants: Neurology  Procedures: EEG - pending   Antibiotics: none  DVT prophylaxis: SCDs  Objective: Blood pressure 162/77, pulse 72, temperature 97.5 F (36.4 C), temperature source Axillary, resp. rate 20, height 5\' 5"  (1.651 m), weight 71.6 kg (157 lb 13.6 oz), SpO2 100 %. No intake or output data in the 24 hours ending 04/24/14 1042  Exam: General: No acute respiratory distress - obtunded  Lungs: Clear to auscultation bilaterally without wheezes or crackles Cardiovascular: Regular rate and rhythm without  murmur gallop or rub normal S1 and S2 Abdomen: Nontender, nondistended, soft, bowel sounds positive, no rebound, no ascites, no appreciable mass Extremities: No significant cyanosis, clubbing, or edema bilateral lower extremities  Data Reviewed: Basic Metabolic Panel:  Recent Labs Lab 04/23/14 1402 04/23/14 1407 04/24/14 0510  NA 142  --  142  K 3.5*  --  3.1*  CL 96  --  99  CO2 24  --  27  GLUCOSE 153*  --  103*  BUN 21  --  17  CREATININE 1.12*  --  1.01  CALCIUM 9.7  --  9.3  MG  --  1.7  --   PHOS  --  2.7  --     Liver Function Tests:  Recent Labs Lab 04/23/14 1402  AST 25  ALT 13  ALKPHOS 76  BILITOT 0.6  PROT 7.5  ALBUMIN 3.7   CBC:  Recent Labs Lab 04/23/14 1402 04/24/14 0510  WBC 10.0 9.6  NEUTROABS 7.7  --   HGB 11.7* 10.9*  HCT 36.5 33.5*  MCV 76.8* 76.0*  PLT 263 256   CBG:  Recent Labs Lab 04/23/14 1349 04/24/14 0400  GLUCAP 139* 114*    Recent Results (from the past 240 hour(s))  MRSA PCR Screening     Status: None   Collection Time: 04/24/14  3:06 AM  Result Value Ref Range Status   MRSA by PCR NEGATIVE NEGATIVE Final    Comment:        The GeneXpert MRSA Assay (FDA  approved for NASAL specimens only), is one component of a comprehensive MRSA colonization surveillance program. It is not intended to diagnose MRSA infection nor to guide or monitor treatment for MRSA infections.      Studies:  Recent x-ray studies have been reviewed in detail by the Attending Physician  Scheduled Meds:  Scheduled Meds: . amLODipine  5 mg Oral Daily  . atorvastatin  80 mg Oral Daily  . chlorthalidone  25 mg Oral Daily  . donepezil  10 mg Oral QHS  . gabapentin  200 mg Oral BID  . lacosamide (VIMPAT) IV  100 mg Intravenous Q12H  . levETIRAcetam  1,000 mg Intravenous Q12H  . lisinopril  10 mg Oral Daily  . sodium chloride  3 mL Intravenous Q12H  . valproate sodium  1 g Intravenous STAT    Time spent on care of this patient: 35  mins   MCCLUNG,JEFFREY T , MD   [facility] Office  [phone] Pager - Text Page per Loretha StaplerAmion as per below:  On-Call/Text Page:      [email or web address]      password [email or web address]  If 7PM-7AM, please contact night-coverage [email or web address] Password [email or web address] 04/24/2014, 10:42 AM   LOS: 1 day

## 2014-04-24 NOTE — Progress Notes (Addendum)
NEURO HOSPITALIST PROGRESS NOTE   SUBJECTIVE:                                                                                                                        Patient hasn't had further seizures. She open her eyes to verbal commands and follows simple commands. Received loading dose 200 mg IV vimpat last night in addition to IV keppra 1 gram in the ED. Now on vimpat 100 mg BID, keppra 1 gram BID. c-EEG at this time still shows intermittent generalized epileptiform activity.  OBJECTIVE:                                                                                                                           Vital signs in last 24 hours: Temp:  [96.9 F (36.1 C)-98.9 F (37.2 C)] 97.5 F (36.4 C) (11/25 0700) Pulse Rate:  [73-100] 73 (11/25 0600) Resp:  [11-32] 18 (11/25 0600) BP: (106-166)/(55-91) 159/68 mmHg (11/25 0600) SpO2:  [92 %-100 %] 100 % (11/25 0600) Weight:  [71.6 kg (157 lb 13.6 oz)] 71.6 kg (157 lb 13.6 oz) (11/25 0300)  Intake/Output from previous day:   Intake/Output this shift:   Nutritional status: Diet NPO time specified  Past Medical History  Diagnosis Date  . Diabetes mellitus without complication   . Hypertension   . Dementia     Physical exam: pleasant female in no apparent distress. Head: normocephalic. Neck: supple, no bruits, no JVD. Cardiac: no murmurs. Lungs: clear. Abdomen: soft, no tender, no mass. Extremities: no edema.  Neurologic Exam:  Mental Status: Arouses to verbal stimuli and follows simple commands inconsistently Cranial Nerves: II: Discs not able to visualize; blinks to threat bilaterally, pupils equal, round, reactive to light and accommodation III,IV, VI: attempts to hold eye lids shut against resistance. extra-ocular motions intact bilaterally V,VII: face symmetric, winces to noxious stimuli bilatearlly VIII: will look toward voice IX,X: gag reflex present XI: bilateral shoulder  shrug XII: holds mouth tightly shut  Motor: Moves all extremities antigravity both spontaneously and purposefully Sensory: withdraws from pain bilaterally Deep Tendon Reflexes:  Right: Upper Extremity Left: Upper extremity   biceps (C-5 to C-6) 2/4 biceps (C-5 to C-6) 2/4 tricep (C7) 2/4triceps (C7) 2/4 Brachioradialis (C6)  2/4Brachioradialis (C6) 2/4  Lower Extremity Lower Extremity  quadriceps (L-2 to L-4) 1/4 quadriceps (L-2 to L-4) 1/4 Achilles (S1) 0/4Achilles (S1) 0/4  Plantars: Right: downgoingLeft: downgoing Cerebellar: Unable to obtain Gait: not tested due to mental status CV: pulses palpable throughout   Lab Results: No results found for: CHOL Lipid Panel No results for input(s): CHOL, TRIG, HDL, CHOLHDL, VLDL, LDLCALC in the last 72 hours.  Studies/Results: Ct Cervical Spine Wo Contrast  04/23/2014   CLINICAL DATA:  Seizures, status post fall  EXAM: CT CERVICAL SPINE WITHOUT CONTRAST  TECHNIQUE: Multidetector CT imaging of the cervical spine was performed without intravenous contrast. Multiplanar CT image reconstructions were also generated.  COMPARISON:  None.  FINDINGS: The alignment is anatomic. The vertebral body heights are maintained. There is no acute fracture. There is no static listhesis. The prevertebral soft tissues are normal. The intraspinal soft tissues are not fully imaged on this examination due to poor soft tissue contrast, but there is no gross soft tissue abnormality.  There is degenerative disc disease at C4-5, C5-6 and C6-7. There are broad-based disc osteophyte complexes at C4-5 and C5-6 with bilateral facet arthropathy and uncovertebral degenerative changes resulting in foraminal encroachment. There is a mild broad-based disc osteophyte complex at C6-7.  The visualized  portions of the lung apices demonstrate no focal abnormality.  IMPRESSION: 1. No acute osseous injury of the cervical spine.   Electronically Signed   By: Elige KoHetal  Patel   On: 04/23/2014 19:49    MEDICATIONS                                                                                                                        Scheduled: . amLODipine  5 mg Oral Daily  . atorvastatin  80 mg Oral Daily  . chlorthalidone  25 mg Oral Daily  . donepezil  10 mg Oral QHS  . gabapentin  200 mg Oral BID  . lacosamide (VIMPAT) IV  100 mg Intravenous Q12H  . levETIRAcetam  1,000 mg Intravenous Q12H  . lisinopril  10 mg Oral Daily  . sodium chloride  3 mL Intravenous Q12H    ASSESSMENT/PLAN:                                                                                                           80 y/o with new onset generalized SE. She is more responsive today but EEG still with findings consistent with SE. Will continue IV vimpat 100 mg BID, and increase daily dose keppra to 1.5 gram BID. Will  give loading dose 1 gram IV Depacon . She is able to open her eyes and follows simple commands although inconsistently. Will follow up.  Wyatt Portelasvaldo Camilo, MD [facility] [phone]  04/24/2014, 8:10 AM

## 2014-04-24 NOTE — Progress Notes (Addendum)
Have attempted to call EEg dept to have pt's continuous EEG reconnected since pt returned from MRI. Will continue to keep trying to get in touch with EEG dept.  1308: spoke with Harriett SineNancy in EEG, she is coming up now to reconnect pt.

## 2014-04-24 NOTE — ED Notes (Addendum)
I attempted to page the EEG tech [phone] in order to get the patient transferred to 3S but she did not return my call.  My AD asked me to call the Neurologist on call and advise him of the situation.  Dr. Roseanne RenoStewart advised that the patient would get an MRI in the morning but tonight he would call the EEG tech in so that they could help getting the patient moved and the EEG restarted upstairs without losing any information.  Dr. Roseanne RenoStewart provided me with a number for the EEG tech, [phone] and [facility]  Dr. Roseanne RenoStewart called her in so that she could be safely transferred to 3S.

## 2014-04-24 NOTE — Plan of Care (Signed)
Per Dr. Roseanne RenoStewart whom I just spoke with on phone: hold off on CT head for now, leave EEG leads in place and plan to do MRI brain tomorrow.  Will defer to his judgement on this issue.  All anticoagulants (plavix) have been held for now in case there is hemorrhage, should be resumed as soon as hemorrhage is ruled out.

## 2014-04-25 DIAGNOSIS — F0391 Unspecified dementia with behavioral disturbance: Secondary | ICD-10-CM

## 2014-04-25 DIAGNOSIS — E1165 Type 2 diabetes mellitus with hyperglycemia: Secondary | ICD-10-CM

## 2014-04-25 DIAGNOSIS — R569 Unspecified convulsions: Secondary | ICD-10-CM | POA: Insufficient documentation

## 2014-04-25 LAB — CBC
HCT: 30.4 % — ABNORMAL LOW (ref 36.0–46.0)
HEMOGLOBIN: 9.7 g/dL — AB (ref 12.0–15.0)
MCH: 23.9 pg — AB (ref 26.0–34.0)
MCHC: 31.9 g/dL (ref 30.0–36.0)
MCV: 74.9 fL — AB (ref 78.0–100.0)
PLATELETS: 264 10*3/uL (ref 150–400)
RBC: 4.06 MIL/uL (ref 3.87–5.11)
RDW: 13.5 % (ref 11.5–15.5)
WBC: 6.8 10*3/uL (ref 4.0–10.5)

## 2014-04-25 LAB — VITAMIN B12: Vitamin B-12: 647 pg/mL (ref 211–911)

## 2014-04-25 LAB — COMPREHENSIVE METABOLIC PANEL
ALT: 8 U/L (ref 0–35)
AST: 14 U/L (ref 0–37)
Albumin: 2.9 g/dL — ABNORMAL LOW (ref 3.5–5.2)
Alkaline Phosphatase: 67 U/L (ref 39–117)
Anion gap: 15 (ref 5–15)
BUN: 24 mg/dL — ABNORMAL HIGH (ref 6–23)
CO2: 27 mEq/L (ref 19–32)
Calcium: 9 mg/dL (ref 8.4–10.5)
Chloride: 101 mEq/L (ref 96–112)
Creatinine, Ser: 1.32 mg/dL — ABNORMAL HIGH (ref 0.50–1.10)
GFR calc non Af Amer: 36 mL/min — ABNORMAL LOW (ref >90)
GFR, EST AFRICAN AMERICAN: 42 mL/min — AB (ref >90)
Glucose, Bld: 70 mg/dL (ref 70–99)
Potassium: 3.5 mEq/L — ABNORMAL LOW (ref 3.7–5.3)
SODIUM: 143 meq/L (ref 137–147)
TOTAL PROTEIN: 6.4 g/dL (ref 6.0–8.3)
Total Bilirubin: 0.5 mg/dL (ref 0.3–1.2)

## 2014-04-25 LAB — IRON AND TIBC
Iron: 60 ug/dL (ref 42–135)
Saturation Ratios: 36 % (ref 20–55)
TIBC: 169 ug/dL — ABNORMAL LOW (ref 250–470)
UIBC: 109 ug/dL — ABNORMAL LOW (ref 125–400)

## 2014-04-25 LAB — RETICULOCYTES
RBC.: 4.14 MIL/uL (ref 3.87–5.11)
RETIC COUNT ABSOLUTE: 24.8 10*3/uL (ref 19.0–186.0)
RETIC CT PCT: 0.6 % (ref 0.4–3.1)

## 2014-04-25 LAB — HEMOGLOBIN A1C
Hgb A1c MFr Bld: 7 % — ABNORMAL HIGH (ref <5.7)
Mean Plasma Glucose: 154 mg/dL — ABNORMAL HIGH (ref <117)

## 2014-04-25 LAB — FOLATE: FOLATE: 11.4 ng/mL

## 2014-04-25 LAB — TSH: TSH: 0.796 u[IU]/mL (ref 0.350–4.500)

## 2014-04-25 LAB — GLUCOSE, CAPILLARY
Glucose-Capillary: 72 mg/dL (ref 70–99)
Glucose-Capillary: 164 mg/dL — ABNORMAL HIGH (ref 70–99)

## 2014-04-25 LAB — FERRITIN: Ferritin: 695 ng/mL — ABNORMAL HIGH (ref 10–291)

## 2014-04-25 MED ORDER — INSULIN ASPART 100 UNIT/ML ~~LOC~~ SOLN
0.0000 [IU] | SUBCUTANEOUS | Status: DC
  Administered 2014-04-25: 2 [IU] via SUBCUTANEOUS

## 2014-04-25 MED ORDER — CETYLPYRIDINIUM CHLORIDE 0.05 % MT LIQD
7.0000 mL | Freq: Two times a day (BID) | OROMUCOSAL | Status: DC
Start: 2014-04-25 — End: 2014-04-30
  Administered 2014-04-25 – 2014-04-30 (×10): 7 mL via OROMUCOSAL

## 2014-04-25 MED ORDER — SODIUM CHLORIDE 0.9 % IV SOLN
150.0000 mg | Freq: Two times a day (BID) | INTRAVENOUS | Status: DC
  Administered 2014-04-25 – 2014-04-29 (×9): 150 mg via INTRAVENOUS
  Filled 2014-04-25 (×18): qty 15

## 2014-04-25 MED ORDER — POTASSIUM CHLORIDE 10 MEQ/100ML IV SOLN
10.0000 meq | INTRAVENOUS | Status: AC
Start: 2014-04-25 — End: 2014-04-25
  Administered 2014-04-25 (×3): 10 meq via INTRAVENOUS
  Filled 2014-04-25 (×2): qty 100

## 2014-04-25 MED ORDER — VALPROATE SODIUM 500 MG/5ML IV SOLN
500.0000 mg | Freq: Three times a day (TID) | INTRAVENOUS | Status: DC
  Administered 2014-04-25 – 2014-04-29 (×13): 500 mg via INTRAVENOUS
  Filled 2014-04-25 (×16): qty 5

## 2014-04-25 NOTE — Progress Notes (Signed)
Ltvm EEG continues for today per Dr. Leroy Kennedyamilo.  Electrode pleacement checked, skin checked, no skin breakdown noted at this time.  EEG study from overnight [facility] He has been notified.

## 2014-04-25 NOTE — Progress Notes (Signed)
Mobile TEAM 1 - Stepdown/ICU TEAM Progress Note  Alfonso RamusBetty S Biondo ZOX:[id number] DOB: 02-May-1930 DOA: 04/23/2014 PCP: Dennison MascotMORRISEY,LEMONT, MD  Admit HPI / Brief Narrative: 80 yo BF PMHx diabetes type 2 uncontrolled, HTN, dementia with behavioral disturbance, female brought in to ED by family from NH after daughter heard a scream and a thump. Found patient on floor jerking both UE, foaming at mouth, with urine and fecal incontinence. Patient was brought in to the ED where it was determined she was in status epilepticus by EEG. This was refractory to 2mg  ativan. While she has a history of seizures, these were 30 years ago and she hasnt been on seizure meds since that time.  Family reports that she has been having "odd episodes only on weekends" since May of 2015. Will sleep from Saturday to Monday refusing to eat, drink, or bathe. Personality changes. From Tuesday till the weekend she would be back to baseline family reports.   HPI/Subjective: 11/26  sleepy, arousable, A/O 4, follows all commands. Per family occurs every Friday Saturday and Sunday and then patient goes back to her baseline during the week.   Assessment/Plan: New onset generalized status epilepticus -Brain MRI; nondiagnostic  -Initial EEG nondiagnostic, patient undergoing continuous EEG monitoring. Will await neurology recommendations -bedside swallow to be conducted  DM2 uncontrolled -11/26 hemoglobin A1c = 7.0  -CBG well controlled at this time, start sensitive SSI to cover as patient begins to take oral nutrition  HTN -fairly well controlled -no medication required at this time  Hypokalemia -potassium IV 10 mEq  Dementia  -continue Aricept 10 mg daily  Microcytic anemia  -TSH within normal limit -Check Fe studies most consistent with iron deficiency anemia; no indication for transfusion or IV iron at this time.     Code Status: FULL Family Communication: family at bedside  Disposition Plan:  SDU    Consultants: Dr Wyatt Portelasvaldo Camilo (Neurology)   Procedure/Significant Events: 11/24 CT C-spine without contrast; negative for acute fracture, DDD C4-C7. C4-5/C5-6 bilateral facet arthropathy and degenerative changes resulting in foraminal encroachment 11/25 MR brain with and without contrast; negative acute abnormality 11/25 EEG;no frank electrographic seizures but bursts of generalized high amplitude sharp waves.   Culture 11/24; MRSA by PCR negative   Antibiotics: NA  DVT prophylaxis: SCD   Devices NA   LINES / TUBES:      Continuous Infusions:   Objective: VITAL SIGNS: Temp: 98.8 F (37.1 C) (11/26 1144) Temp Source: Axillary (11/26 1144) BP: 144/69 mmHg (11/26 0800) Pulse Rate: 67 (11/26 0800) SPO2; FIO2:   Intake/Output Summary (Last 24 hours) at 04/25/14 1426 Last data filed at 04/25/14 0800  Gross per 24 hour  Intake    100 ml  Output      0 ml  Net    100 ml     Exam: General: sleepy, arousable, A/O 4, NAD, follows all commands,No acute respiratory distress Lungs: Clear to auscultation bilaterally without wheezes or crackles Cardiovascular: Regular rate and rhythm without murmur gallop or rub normal S1 and S2 Abdomen: Nontender, nondistended, soft, bowel sounds positive, no rebound, no ascites, no appreciable mass Extremities: No significant cyanosis, clubbing, or edema bilateral lower extremities Neurologic; cranial nerves II through XII intact, pupils equal round reactive to light and accommodation, tongue/uvula midline, bilateral upper extremity strength 4/5, bilateral lower extremity strength 2/5, able to wiggle bilateral toes on command. Did not ambulate patient  Data Reviewed: Basic Metabolic Panel:  Recent Labs Lab 04/23/14 1402 04/23/14 1407 04/24/14 0510 04/25/14 0240  NA 142  --  142 143  K 3.5*  --  3.1* 3.5*  CL 96  --  99 101  CO2 24  --  27 27  GLUCOSE 153*  --  103* 70  BUN 21  --  17 24*  CREATININE 1.12*  --   1.01 1.32*  CALCIUM 9.7  --  9.3 9.0  MG  --  1.7  --   --   PHOS  --  2.7  --   --    Liver Function Tests:  Recent Labs Lab 04/23/14 1402 04/25/14 0240  AST 25 14  ALT 13 8  ALKPHOS 76 67  BILITOT 0.6 0.5  PROT 7.5 6.4  ALBUMIN 3.7 2.9*   No results for input(s): LIPASE, AMYLASE in the last 168 hours. No results for input(s): AMMONIA in the last 168 hours. CBC:  Recent Labs Lab 04/23/14 1402 04/24/14 0510 04/25/14 0240  WBC 10.0 9.6 6.8  NEUTROABS 7.7  --   --   HGB 11.7* 10.9* 9.7*  HCT 36.5 33.5* 30.4*  MCV 76.8* 76.0* 74.9*  PLT 263 256 264   Cardiac Enzymes: No results for input(s): CKTOTAL, CKMB, CKMBINDEX, TROPONINI in the last 168 hours. BNP (last 3 results) No results for input(s): PROBNP in the last 8760 hours. CBG:  Recent Labs Lab 04/23/14 1349 04/24/14 0400  GLUCAP 139* 114*    Recent Results (from the past 240 hour(s))  MRSA PCR Screening     Status: None   Collection Time: 04/24/14  3:06 AM  Result Value Ref Range Status   MRSA by PCR NEGATIVE NEGATIVE Final    Comment:        The GeneXpert MRSA Assay (FDA approved for NASAL specimens only), is one component of a comprehensive MRSA colonization surveillance program. It is not intended to diagnose MRSA infection nor to guide or monitor treatment for MRSA infections.      Studies:  Recent x-ray studies have been reviewed in detail by the Attending Physician  Scheduled Meds:  Scheduled Meds: . antiseptic oral rinse  7 mL Mouth Rinse BID  . donepezil  10 mg Oral QHS  . lacosamide (VIMPAT) IV  150 mg Intravenous Q12H  . levETIRAcetam  1,000 mg Intravenous Q12H  . sodium chloride  3 mL Intravenous Q12H  . valproate sodium  500 mg Intravenous 3 times per day    Time spent on care of this patient: 40 mins   Drema DallasWOODS, CURTIS, J , MD   [facility] Office  [phone] Pager - [phone]  On-Call/Text Page:      [email or web address]      password [email or web address]  If 7PM-7AM, please  contact night-coverage [email or web address] Password [email or web address] 04/25/2014, 2:26 PM   LOS: 2 days

## 2014-04-25 NOTE — Progress Notes (Signed)
NEURO HOSPITALIST PROGRESS NOTE   SUBJECTIVE:                                                                                                                        She is sleeping but arouses upon verbal commands, and follows simple commands. No further clinical seizures noted and c-EEG shows no frank electrographic seizures but bursts of generalized high amplitude sharp waves. On  vimpat 100 mg BID, keppra 1.5 gram BID. Serologies significant for Cr 1.5. UA negative.    OBJECTIVE:                                                                                                                           Vital signs in last 24 hours: Temp:  [97.8 F (36.6 C)-99.7 F (37.6 C)] 98.2 F (36.8 C) (11/26 0800) Pulse Rate:  [70-81] 74 (11/26 0403) Resp:  [18-20] 18 (11/26 0403) BP: (130-142)/(60-68) 137/60 mmHg (11/26 0403) SpO2:  [96 %-100 %] 98 % (11/26 0403)  Intake/Output from previous day: 11/25 0701 - 11/26 0700 In: 125 [IV Piggyback:125] Out: -  Intake/Output this shift:   Nutritional status: Diet NPO time specified  Past Medical History  Diagnosis Date  . Diabetes mellitus without complication   . Hypertension   . Dementia    Physical exam: pleasant female in no apparent distress. Head: normocephalic. Neck: supple, no bruits, no JVD. Cardiac: no murmurs. Lungs: clear. Abdomen: soft, no tender, no mass. Extremities: no edema. Neurologic Exam:  Mental Status: Arouses to verbal stimuli and follows simple commands inconsistently Cranial Nerves: II: Discs not able to visualize; blinks to threat bilaterally, pupils equal, round, reactive to light and accommodation III,IV, VI: attempts to hold eye lids shut against resistance. extra-ocular motions intact bilaterally V,VII: face symmetric, winces to noxious stimuli bilatearlly VIII: will look toward voice IX,X: gag reflex present XI: bilateral shoulder shrug XII: holds mouth  tightly shut  Motor: Moves all extremities antigravity both spontaneously and purposefully Sensory: withdraws from pain bilaterally Deep Tendon Reflexes:  Right: Upper Extremity Left: Upper extremity   biceps (C-5 to C-6) 2/4 biceps (C-5 to C-6) 2/4 tricep (C7) 2/4triceps (C7) 2/4 Brachioradialis (C6) 2/4Brachioradialis (C6) 2/4  Lower Extremity Lower Extremity  quadriceps (L-2 to  L-4) 1/4 quadriceps (L-2 to L-4) 1/4 Achilles (S1) 0/4Achilles (S1) 0/4  Plantars: Right: downgoingLeft: downgoing Cerebellar: Unable to obtain Gait: not tested due to mental status  Lab Results: No results found for: CHOL Lipid Panel No results for input(s): CHOL, TRIG, HDL, CHOLHDL, VLDL, LDLCALC in the last 72 hours.  Studies/Results: Ct Cervical Spine Wo Contrast  04/23/2014   CLINICAL DATA:  Seizures, status post fall  EXAM: CT CERVICAL SPINE WITHOUT CONTRAST  TECHNIQUE: Multidetector CT imaging of the cervical spine was performed without intravenous contrast. Multiplanar CT image reconstructions were also generated.  COMPARISON:  None.  FINDINGS: The alignment is anatomic. The vertebral body heights are maintained. There is no acute fracture. There is no static listhesis. The prevertebral soft tissues are normal. The intraspinal soft tissues are not fully imaged on this examination due to poor soft tissue contrast, but there is no gross soft tissue abnormality.  There is degenerative disc disease at C4-5, C5-6 and C6-7. There are broad-based disc osteophyte complexes at C4-5 and C5-6 with bilateral facet arthropathy and uncovertebral degenerative changes resulting in foraminal encroachment. There is a mild broad-based disc osteophyte complex at C6-7.  The visualized portions of the lung apices demonstrate no focal  abnormality.  IMPRESSION: 1. No acute osseous injury of the cervical spine.   Electronically Signed   By: Elige KoHetal  Patel   On: 04/23/2014 19:49   Mr Brain W Wo Contrast  04/24/2014   CLINICAL DATA:  Seizure  EXAM: MRI HEAD WITHOUT AND WITH CONTRAST  TECHNIQUE: Multiplanar, multiecho pulse sequences of the brain and surrounding structures were obtained without and with intravenous contrast.  CONTRAST:  15mL MULTIHANCE GADOBENATE DIMEGLUMINE 529 MG/ML IV SOLN  COMPARISON:  MRI 01/14/2014  FINDINGS: Image quality degraded by mild motion.  Negative for acute infarct.  Patchy mild hyperintensity in the white matter is stable and most consistent with chronic microvascular ischemia. Brainstem and cerebellum intact.  Negative for hemorrhage.  Negative for mass lesion.  Postcontrast imaging degraded by motion however no enhancing lesions are identified.  Paranasal sinuses are clear.  IMPRESSION: Mild chronic microvascular ischemic change in the white matter. No acute abnormality.   Electronically Signed   By: Marlan Palauharles  Clark M.D.   On: 04/24/2014 11:59    MEDICATIONS                                                                                                                        Scheduled: . donepezil  10 mg Oral QHS  . lacosamide (VIMPAT) IV  100 mg Intravenous Q12H  . levETIRAcetam  1,000 mg Intravenous Q12H  . sodium chloride  3 mL Intravenous Q12H    ASSESSMENT/PLAN:  80 y/o with new onset generalized SE. No electrographic seizures noted at this moment, and she is able to open her eyes and follow simple commands. Will increase IV vimpat to 150 mg BID. Monitor Cr and adjust keppra accordingly. Start IV Depacon in anticipation of possible lowering dose of IV keppra due to declining renal function. Will continue eeg monitoring for now. Will follow up.   Wyatt Portela, MD [facility] [phone]  04/25/2014, 8:09 AM

## 2014-04-26 LAB — CBC WITH DIFFERENTIAL/PLATELET
BASOS ABS: 0 10*3/uL (ref 0.0–0.1)
Basophils Relative: 0 % (ref 0–1)
EOS ABS: 0.1 10*3/uL (ref 0.0–0.7)
Eosinophils Relative: 2 % (ref 0–5)
HCT: 33.1 % — ABNORMAL LOW (ref 36.0–46.0)
Hemoglobin: 10.4 g/dL — ABNORMAL LOW (ref 12.0–15.0)
Lymphocytes Relative: 36 % (ref 12–46)
Lymphs Abs: 2.5 10*3/uL (ref 0.7–4.0)
MCH: 23.6 pg — AB (ref 26.0–34.0)
MCHC: 31.4 g/dL (ref 30.0–36.0)
MCV: 75.1 fL — ABNORMAL LOW (ref 78.0–100.0)
Monocytes Absolute: 1 10*3/uL (ref 0.1–1.0)
Monocytes Relative: 14 % — ABNORMAL HIGH (ref 3–12)
Neutro Abs: 3.4 10*3/uL (ref 1.7–7.7)
Neutrophils Relative %: 48 % (ref 43–77)
PLATELETS: 253 10*3/uL (ref 150–400)
RBC: 4.41 MIL/uL (ref 3.87–5.11)
RDW: 13.3 % (ref 11.5–15.5)
WBC: 7 10*3/uL (ref 4.0–10.5)

## 2014-04-26 LAB — GLUCOSE, CAPILLARY
GLUCOSE-CAPILLARY: 91 mg/dL (ref 70–99)
GLUCOSE-CAPILLARY: 151 mg/dL — AB (ref 70–99)
GLUCOSE-CAPILLARY: 81 mg/dL (ref 70–99)
GLUCOSE-CAPILLARY: 103 mg/dL — AB (ref 70–99)
Glucose-Capillary: 151 mg/dL — ABNORMAL HIGH (ref 70–99)
Glucose-Capillary: 83 mg/dL (ref 70–99)
Glucose-Capillary: 85 mg/dL (ref 70–99)

## 2014-04-26 LAB — COMPREHENSIVE METABOLIC PANEL
ALT: 8 U/L (ref 0–35)
AST: 15 U/L (ref 0–37)
Albumin: 2.9 g/dL — ABNORMAL LOW (ref 3.5–5.2)
Alkaline Phosphatase: 68 U/L (ref 39–117)
Anion gap: 15 (ref 5–15)
BILIRUBIN TOTAL: 0.5 mg/dL (ref 0.3–1.2)
BUN: 24 mg/dL — ABNORMAL HIGH (ref 6–23)
CHLORIDE: 99 meq/L (ref 96–112)
CO2: 25 meq/L (ref 19–32)
Calcium: 8.9 mg/dL (ref 8.4–10.5)
Creatinine, Ser: 1.2 mg/dL — ABNORMAL HIGH (ref 0.50–1.10)
GFR, EST AFRICAN AMERICAN: 47 mL/min — AB (ref >90)
GFR, EST NON AFRICAN AMERICAN: 41 mL/min — AB (ref >90)
GLUCOSE: 71 mg/dL (ref 70–99)
Potassium: 3.7 mEq/L (ref 3.7–5.3)
SODIUM: 139 meq/L (ref 137–147)
Total Protein: 6.7 g/dL (ref 6.0–8.3)

## 2014-04-26 LAB — MAGNESIUM: MAGNESIUM: 1.7 mg/dL (ref 1.5–2.5)

## 2014-04-26 MED ORDER — INSULIN ASPART 100 UNIT/ML ~~LOC~~ SOLN
0.0000 [IU] | Freq: Three times a day (TID) | SUBCUTANEOUS | Status: DC
  Administered 2014-04-26: 2 [IU] via SUBCUTANEOUS
  Administered 2014-04-27: 1 [IU] via SUBCUTANEOUS

## 2014-04-26 MED ORDER — SODIUM CHLORIDE 0.9 % IV SOLN
INTRAVENOUS | Status: DC
  Administered 2014-04-26 – 2014-04-28 (×3): via INTRAVENOUS

## 2014-04-26 MED ORDER — POTASSIUM CHLORIDE CRYS ER 20 MEQ PO TBCR
40.0000 meq | EXTENDED_RELEASE_TABLET | Freq: Once | ORAL | Status: DC
  Filled 2014-04-26: qty 2

## 2014-04-26 MED ORDER — ATORVASTATIN CALCIUM 80 MG PO TABS
80.0000 mg | ORAL_TABLET | Freq: Every day | ORAL | Status: DC
  Administered 2014-04-26 – 2014-04-29 (×4): 80 mg via ORAL
  Filled 2014-04-26 (×5): qty 1

## 2014-04-26 MED ORDER — CLOPIDOGREL BISULFATE 75 MG PO TABS
75.0000 mg | ORAL_TABLET | Freq: Every day | ORAL | Status: DC
  Administered 2014-04-27 – 2014-04-30 (×4): 75 mg via ORAL
  Filled 2014-04-26 (×5): qty 1

## 2014-04-26 NOTE — Progress Notes (Signed)
Pt admitted to 4N27.  Daughter at bedside.  Pt drowsy but following commands. Dtr states pt lives with her but she wants placement for rehab.  Pt is very weak! Bed alarm set.  Family at bedside.  Will continue to monitor.

## 2014-04-26 NOTE — Progress Notes (Signed)
NEURO HOSPITALIST PROGRESS NOTE   SUBJECTIVE:                                                                                                                        Patient is fully awake and alert, answering questions, and family at the bedside said that she is back to baseline since last night. c-EEG without electrographic seizures. On depakote, keppra, and vimpat.   OBJECTIVE:                                                                                                                           Vital signs in last 24 hours: Temp:  [98 F (36.7 C)-98.8 F (37.1 C)] 98 F (36.7 C) (11/27 0700) Pulse Rate:  [65-87] 77 (11/27 0703) Resp:  [13-29] 29 (11/27 0703) BP: (118-160)/(45-81) 160/66 mmHg (11/27 0703) SpO2:  [95 %-100 %] 99 % (11/27 0703)  Intake/Output from previous day: 11/26 0701 - 11/27 0700 In: 420 [P.O.:420] Out: 125 [Urine:125] Intake/Output this shift:   Nutritional status: Diet clear liquid  Past Medical History  Diagnosis Date  . Diabetes mellitus without complication   . Hypertension   . Dementia    Physical exam: pleasant female in no apparent distress. Head: normocephalic. Neck: supple, no bruits, no JVD. Cardiac: no murmurs. Lungs: clear. Abdomen: soft, no tender, no mass. Extremities: no edema.  Neurologic Exam:  General: Mental Status: Alert, oriented, thought content appropriate.  Speech fluent without evidence of aphasia.  Able to follow 3 step commands without difficulty. Cranial Nerves: II: Discs flat bilaterally; Visual fields grossly normal, pupils equal, round, reactive to light and accommodation III,IV, VI: ptosis not present, extra-ocular motions intact bilaterally V,VII: smile symmetric, facial light touch sensation normal bilaterally VIII: hearing normal bilaterally IX,X: gag reflex present XI: bilateral shoulder shrug XII: midline tongue extension without atrophy or  fasciculations  Motor: Right : Upper extremity   5/5    Left:     Upper extremity   5/5  Lower extremity   5/5     Lower extremity   5/5 Tone and bulk:normal tone throughout; no atrophy noted Sensory: Pinprick and light touch intact throughout, bilaterally Deep Tendon Reflexes:  Right: Upper Extremity   Left: Upper extremity  biceps (C-5 to C-6) 2/4   biceps (C-5 to C-6) 2/4 tricep (C7) 2/4    triceps (C7) 2/4 Brachioradialis (C6) 2/4  Brachioradialis (C6) 2/4  Lower Extremity Lower Extremity  quadriceps (L-2 to L-4) 2/4   quadriceps (L-2 to L-4) 2/4 Achilles (S1) 2/4   Achilles (S1) 2/4  Plantars: Right: downgoing   Left: downgoing Cerebellar: normal finger-to-nose,  normal heel-to-shin test Gait:  No tested    Lab Results: No results found for: CHOL Lipid Panel No results for input(s): CHOL, TRIG, HDL, CHOLHDL, VLDL, LDLCALC in the last 72 hours.  Studies/Results: Mr Laqueta JeanBrain W Wo Contrast  04/24/2014   CLINICAL DATA:  Seizure  EXAM: MRI HEAD WITHOUT AND WITH CONTRAST  TECHNIQUE: Multiplanar, multiecho pulse sequences of the brain and surrounding structures were obtained without and with intravenous contrast.  CONTRAST:  15mL MULTIHANCE GADOBENATE DIMEGLUMINE 529 MG/ML IV SOLN  COMPARISON:  MRI 01/14/2014  FINDINGS: Image quality degraded by mild motion.  Negative for acute infarct.  Patchy mild hyperintensity in the white matter is stable and most consistent with chronic microvascular ischemia. Brainstem and cerebellum intact.  Negative for hemorrhage.  Negative for mass lesion.  Postcontrast imaging degraded by motion however no enhancing lesions are identified.  Paranasal sinuses are clear.  IMPRESSION: Mild chronic microvascular ischemic change in the white matter. No acute abnormality.   Electronically Signed   By: Marlan Palauharles  Clark M.D.   On: 04/24/2014 11:59    MEDICATIONS                                                                                                                         Scheduled: . antiseptic oral rinse  7 mL Mouth Rinse BID  . donepezil  10 mg Oral QHS  . insulin aspart  0-9 Units Subcutaneous 6 times per day  . lacosamide (VIMPAT) IV  150 mg Intravenous Q12H  . levETIRAcetam  1,000 mg Intravenous Q12H  . sodium chloride  3 mL Intravenous Q12H  . valproate sodium  500 mg Intravenous 3 times per day    ASSESSMENT/PLAN:                                                                                                           80 y/o with new onset generalized SE which had resolved. She seems to be back to baseline Will continue current AED regimen for now. Will follow     Wyatt Portelasvaldo Aveah Castell, MD [facility] [phone]  04/26/2014, 8:12 AM

## 2014-04-26 NOTE — Progress Notes (Signed)
Per RN on 3S, pt refused po dose of K+; offered on 4N and refused.  Dtr states she can be "very stubborn."

## 2014-04-26 NOTE — Progress Notes (Signed)
Received orders to transfer pt to 4N, pt and family aware of transfer. Report called to RN on 4N. VS stable, no s/s of acute distress noted.

## 2014-04-26 NOTE — Progress Notes (Signed)
LTM EEG D/C'd per Dr Camilo 

## 2014-04-26 NOTE — Evaluation (Signed)
Physical Therapy Evaluation Patient Details Name: Margaret RamusBetty S Kook MRN: [id number] DOB: 04/14/1930 Today's Date: 04/26/2014   History of Present Illness  80 y/o with new onset generalized seiures adm after fall at home. Pt with c-EEG without electrographic seizures.  Clinical Impression  Pt adm due to above. Pt very lethargic while laying supine but more alert and conversant sitting EOB. Pt to benefit from skilled acute PT to address deficits and maximize functional mobility. PTA pt was independent with ADLs and living with daughter. Daughter present and reports she would like for her mother to go to SNF for post acute rehab prior to returning home.     Follow Up Recommendations SNF    Equipment Recommendations  Other (comment) (TBD)    Recommendations for Other Services       Precautions / Restrictions Precautions Precautions: Fall Restrictions Weight Bearing Restrictions: No      Mobility  Bed Mobility Overal bed mobility: Needs Assistance Bed Mobility: Supine to Sit;Sit to Supine     Supine to sit: HOB elevated;Max assist Sit to supine: Max assist   General bed mobility comments: pt requiring max (A) to bring LEs off and back into bed and (A) to elevate trunk; pt more alert and responsive with eyes open while sitting EOB; when pt is in supine position she is keeping her eyes closed but responds with incr time and tactile cues; pt not (A) with bed mobility today   Transfers                 General transfer comment: pt too lethargic this session; will require 2 person (A) to safely assess.   Ambulation/Gait                Stairs            Wheelchair Mobility    Modified Rankin (Stroke Patients Only)       Balance Overall balance assessment: Needs assistance;History of Falls Sitting-balance support: Feet supported;Single extremity supported;No upper extremity supported Sitting balance-Leahy Scale: Fair Sitting balance - Comments: pt initially  requiring UE support and min (A) to balance at EOB; progressed to sitting EOB at supervision level and pt more alert and conversant; pt appropriate with responses but requiring incr time; tolerated sitting EOB ~5 min then fatigued and returned to supine  Postural control: Posterior lean                                   Pertinent Vitals/Pain Pain Assessment: No/denies pain    Home Living Family/patient expects to be discharged to:: Private residence Living Arrangements: Children               Additional Comments: daughter who pt lives with in room and stated "we are going to rehab before home this time"     Prior Function Level of Independence: Independent               Hand Dominance        Extremity/Trunk Assessment   Upper Extremity Assessment: Defer to OT evaluation           Lower Extremity Assessment: Generalized weakness      Cervical / Trunk Assessment: Normal  Communication   Communication: No difficulties  Cognition Arousal/Alertness: Suspect due to medications;Lethargic Behavior During Therapy: Flat affect Overall Cognitive Status: Impaired/Different from baseline Area of Impairment: Safety/judgement;Problem solving     Memory: Decreased short-term memory  Safety/Judgement: Decreased awareness of deficits;Decreased awareness of safety   Problem Solving: Slow processing;Requires verbal cues;Requires tactile cues General Comments: delayed responses; uncertain if this is due to AMS vs lethargy from medications     General Comments General comments (skin integrity, edema, etc.): daughter who she lives with present during session    Exercises        Assessment/Plan    PT Assessment Patient needs continued PT services  PT Diagnosis Difficulty walking;Generalized weakness;Altered mental status   PT Problem List Decreased strength;Decreased activity tolerance;Decreased balance;Decreased mobility;Decreased cognition;Decreased  safety awareness;Decreased knowledge of use of DME  PT Treatment Interventions DME instruction;Gait training;Functional mobility training;Balance training;Therapeutic exercise;Therapeutic activities;Neuromuscular re-education;Patient/family education   PT Goals (Current goals can be found in the Care Plan section) Acute Rehab PT Goals Patient Stated Goal: daughter stated "for my mom to go to rehab then home" PT Goal Formulation: With patient/family Time For Goal Achievement: 05/03/14 Potential to Achieve Goals: Fair    Frequency Min 3X/week   Barriers to discharge Decreased caregiver support daughter reports she will be unable to care for pt at this time upon acute D/C     Co-evaluation               End of Session   Activity Tolerance: Patient limited by lethargy;Patient limited by fatigue Patient left: in bed;with call bell/phone within reach;with bed alarm set;with family/visitor present Nurse Communication: Mobility status;Precautions         Time: [id number] PT Time Calculation (min) (ACUTE ONLY): 16 min   Charges:   PT Evaluation $Initial PT Evaluation Tier I: 1 Procedure PT Treatments $Therapeutic Activity: 8-22 mins   PT G CodesDonell Sievert:          Markey Deady N, [place]PT  [phone] 04/26/2014, 3:38 PM

## 2014-04-26 NOTE — Procedures (Signed)
ELECTROENCEPHALOGRAM REPORT   Patient: Margaret Hernandez       Room #: 4U984N27 EEG No. ID: [id number] Age: 80 y.o.        Sex: female Referring Physician: Sharon SellerMcClung Report Date:  04/26/2014        Interpreting Physician: Thana FarrEYNOLDS,Lorinda Copland D  History: Margaret Hernandez is an 80 y.o. female with episodes of altered mental status  Medications:  Scheduled: . antiseptic oral rinse  7 mL Mouth Rinse BID  . atorvastatin  80 mg Oral Q2000  . [START ON 04/27/2014] clopidogrel  75 mg Oral Q breakfast  . donepezil  10 mg Oral QHS  . insulin aspart  0-9 Units Subcutaneous TID WC  . lacosamide (VIMPAT) IV  150 mg Intravenous Q12H  . levETIRAcetam  1,000 mg Intravenous Q12H  . potassium chloride  40 mEq Oral Once  . valproate sodium  500 mg Intravenous 3 times per day    Conditions of Recording:  The study consists of a continuous digital video EEG recording, with 21 electrodes placed according to the International 10-20 System. Additional leads included an EKG lead. The study was carried out with the patient in the awake and asleep states.  Description: The recording while awake is characterized by mild-moderate diffuse slowing (low voltage polymorphic theta with some intermixed delta) and mild slowing of the posterior rhythm (20-40 uV, 6.5-7.0 Hz) with occasional triphasic activity.  In the bipolar montage there are sometimes minor asymmetries but none were persistent.   The patient does achieve stage II sleep and with sleep this triphasic activity resolves.    Hyperventilation and itermittent photic stimulation were not performed.  IMPRESSION: This is an abnormal electroencephalogram secondary to general background slowing and occasional triphasic activity.  This finding is consistent with a diffuse cerebral disturbance that is etiologically nonspecific but may include a metabolic encephalopathy among other possibilities.   Thana FarrLeslie Nesta Kimple, MD [facility] [phone] 04/26/2014, 5:14 PM

## 2014-04-26 NOTE — Progress Notes (Signed)
[facility] TEAM 1 - Stepdown/ICU TEAM Progress Note  Alfonso RamusBetty S Chovan AVW:[id number] DOB: 1930-02-19 DOA: 04/23/2014 PCP: Dennison MascotMORRISEY,LEMONT, MD  Admit HPI / Brief Narrative: 80 yo female brought in to ED by family from NH after daughter heard a scream and a thump. Found patient on floor jerking both UE, foaming at mouth, with urine and fecal incontinence. Patient was brought in to the ED where it was determined she was in status epilepticus by EEG. This was refractory to 2mg  ativan.  While she has a history of seizures, these were 30 years ago and she hasnt been on seizure meds since that time.  Family reports that she has been having "odd episodes only on weekends" since May of 2015. Will sleep from Saturday to Monday refusing to eat, drink, or bathe. Personality changes. From Tuesday till the weekend she would be back to baseline family reports.  Since admission, the patient's stay has been largely uneventful.  An MRI has ruled out a structural brain issue, and an continuous EEG has ruled out ogoing seizures.  She has slowly begun to return to her baseline mental status.  Neurology has been following, and is attending to her sz med tx.  She is to be transferred to a Neuro med bed for ongoing monitoring, and to begin therapy.    HPI/Subjective: Pt awake and alert / almost back to her baseline MS since last PM per family.  She does not remember the events leading to her admission.  No new complaints today.  Assessment/Plan:  New onset generalized status epilepticus Medication tx as per Neurology - MRI brain nondiagnostic - no evidence of seizures on continuous EEG - medical tx per Neurology   DM2 CBG well controlled at this time - does not appear to be on med tx as outpt - A1c 7.0  Mild renal insuff Likely related to modest DH - hydrate gently and follow   HTN BP sporadic - no change in tx today and follow trend   Hypokalemia Replace - Mg normal - follow  Dementia  Cont aricept    Microcytic anemia  Fe studies most consistent with anemia related to poor nutrition v/s "chronic disease" - follow Hgb - no indication of Fe deficiency despite microcytic nature    Code Status: FULL Family Communication: spoke w/ daughter at bedside  Disposition Plan: stable for transfer to Neuro floor - begin PT/OT to assist in D/C planning   Consultants: Neurology  Procedures: 11/26 EEG - persistent generalized epileptiform activity  11/27 EEG - no seizures per Neuro note   Antibiotics: none  DVT prophylaxis: SCDs  Objective: Blood pressure 160/66, pulse 77, temperature 98 F (36.7 C), temperature source Oral, resp. rate 29, height 5\' 5"  (1.651 m), weight 71.6 kg (157 lb 13.6 oz), SpO2 99 %.  Intake/Output Summary (Last 24 hours) at 04/26/14 0940 Last data filed at 04/26/14 0900  Gross per 24 hour  Intake    420 ml  Output    275 ml  Net    145 ml    Exam: General: No acute respiratory distress - alert and conversant  Lungs: Clear to auscultation bilaterally without wheezes or crackles Cardiovascular: Regular rate and rhythm without murmur gallop or rub  Abdomen: Nontender, nondistended, soft, bowel sounds positive, no rebound, no ascites, no appreciable mass Extremities: No significant cyanosis, clubbing, or edema bilateral lower extremities  Data Reviewed: Basic Metabolic Panel:  Recent Labs Lab 04/23/14 1402 04/23/14 1407 04/24/14 0510 04/25/14 0240 04/26/14 0228  NA  142  --  142 143 139  K 3.5*  --  3.1* 3.5* 3.7  CL 96  --  99 101 99  CO2 24  --  27 27 25   GLUCOSE 153*  --  103* 70 71  BUN 21  --  17 24* 24*  CREATININE 1.12*  --  1.01 1.32* 1.20*  CALCIUM 9.7  --  9.3 9.0 8.9  MG  --  1.7  --   --  1.7  PHOS  --  2.7  --   --   --     Liver Function Tests:  Recent Labs Lab 04/23/14 1402 04/25/14 0240 04/26/14 0228  AST 25 14 15   ALT 13 8 8   ALKPHOS 76 67 68  BILITOT 0.6 0.5 0.5  PROT 7.5 6.4 6.7  ALBUMIN 3.7 2.9* 2.9*    CBC:  Recent Labs Lab 04/23/14 1402 04/24/14 0510 04/25/14 0240 04/26/14 0228  WBC 10.0 9.6 6.8 7.0  NEUTROABS 7.7  --   --  3.4  HGB 11.7* 10.9* 9.7* 10.4*  HCT 36.5 33.5* 30.4* 33.1*  MCV 76.8* 76.0* 74.9* 75.1*  PLT 263 256 264 253   CBG:  Recent Labs Lab 04/25/14 1532 04/25/14 2010 04/26/14 0005 04/26/14 0410 04/26/14 0745  GLUCAP 72 164* 85 83 91    Recent Results (from the past 240 hour(s))  MRSA PCR Screening     Status: None   Collection Time: 04/24/14  3:06 AM  Result Value Ref Range Status   MRSA by PCR NEGATIVE NEGATIVE Final    Comment:        The GeneXpert MRSA Assay (FDA approved for NASAL specimens only), is one component of a comprehensive MRSA colonization surveillance program. It is not intended to diagnose MRSA infection nor to guide or monitor treatment for MRSA infections.      Studies:  Recent x-ray studies have been reviewed in detail by the Attending Physician  Scheduled Meds:  Scheduled Meds: . antiseptic oral rinse  7 mL Mouth Rinse BID  . donepezil  10 mg Oral QHS  . insulin aspart  0-9 Units Subcutaneous 6 times per day  . lacosamide (VIMPAT) IV  150 mg Intravenous Q12H  . levETIRAcetam  1,000 mg Intravenous Q12H  . sodium chloride  3 mL Intravenous Q12H  . valproate sodium  500 mg Intravenous 3 times per day    Time spent on care of this patient: 35 mins   Modene Andy T , MD   [facility] Office  [phone] Pager - Text Page per Loretha StaplerAmion as per below:  On-Call/Text Page:      [email or web address]      password [email or web address]  If 7PM-7AM, please contact night-coverage [email or web address] Password [email or web address] 04/26/2014, 9:40 AM   LOS: 3 days

## 2014-04-27 LAB — CBC
HEMATOCRIT: 29.2 % — AB (ref 36.0–46.0)
Hemoglobin: 9.3 g/dL — ABNORMAL LOW (ref 12.0–15.0)
MCH: 23.6 pg — ABNORMAL LOW (ref 26.0–34.0)
MCHC: 31.8 g/dL (ref 30.0–36.0)
MCV: 74.1 fL — AB (ref 78.0–100.0)
Platelets: 210 10*3/uL (ref 150–400)
RBC: 3.94 MIL/uL (ref 3.87–5.11)
RDW: 13.3 % (ref 11.5–15.5)
WBC: 5.8 10*3/uL (ref 4.0–10.5)

## 2014-04-27 LAB — BASIC METABOLIC PANEL
Anion gap: 12 (ref 5–15)
BUN: 11 mg/dL (ref 6–23)
CALCIUM: 8.5 mg/dL (ref 8.4–10.5)
CO2: 27 mEq/L (ref 19–32)
Chloride: 100 mEq/L (ref 96–112)
Creatinine, Ser: 0.8 mg/dL (ref 0.50–1.10)
GFR calc Af Amer: 77 mL/min — ABNORMAL LOW (ref >90)
GFR, EST NON AFRICAN AMERICAN: 67 mL/min — AB (ref >90)
GLUCOSE: 84 mg/dL (ref 70–99)
Potassium: 3.5 mEq/L — ABNORMAL LOW (ref 3.7–5.3)
Sodium: 139 mEq/L (ref 137–147)

## 2014-04-27 LAB — GLUCOSE, CAPILLARY
GLUCOSE-CAPILLARY: 86 mg/dL (ref 70–99)
GLUCOSE-CAPILLARY: 83 mg/dL (ref 70–99)
Glucose-Capillary: 87 mg/dL (ref 70–99)
Glucose-Capillary: 136 mg/dL — ABNORMAL HIGH (ref 70–99)

## 2014-04-27 MED ORDER — POTASSIUM CHLORIDE 10 MEQ/100ML IV SOLN
10.0000 meq | INTRAVENOUS | Status: AC
  Administered 2014-04-27 (×3): 10 meq via INTRAVENOUS
  Filled 2014-04-27 (×3): qty 100

## 2014-04-27 MED ORDER — MAGNESIUM SULFATE 2 GM/50ML IV SOLN
2.0000 g | Freq: Once | INTRAVENOUS | Status: AC
  Administered 2014-04-27: 2 g via INTRAVENOUS
  Filled 2014-04-27: qty 50

## 2014-04-27 MED ORDER — POTASSIUM CHLORIDE 20 MEQ PO PACK: 40.0000 meq | PACK | Freq: Four times a day (QID) | ORAL | Status: DC

## 2014-04-27 MED ORDER — POTASSIUM CHLORIDE CRYS ER 20 MEQ PO TBCR
40.0000 meq | EXTENDED_RELEASE_TABLET | Freq: Four times a day (QID) | ORAL | Status: DC
  Filled 2014-04-27 (×2): qty 2

## 2014-04-27 NOTE — Progress Notes (Signed)
Contacted IV team to start 2nd IV site for multiple IV medications; team unsuccessful at this time. Patient is continuing with pattern at home of "going to sleep on weekends", Daughter states, "she probably wont wake up until Tuesday". Patient rousable briefly to noxious stimuli only, and will accept nothing by mouth. IV meds staggered for her 1 IV site. Patients family not receptive at this time to possibility of NG tube for meds. Continue to observe patient for changes and provide reassurance and support to family.

## 2014-04-27 NOTE — Plan of Care (Signed)
Problem: Phase II Progression Outcomes Goal: Pain controlled Outcome: Completed/Met Date Met:  04/27/14

## 2014-04-27 NOTE — Progress Notes (Signed)
Progress Note  Margaret RamusBetty S Rispoli JYN:[id number] DOB: 02-17-1930 DOA: 04/23/2014 PCP: Dennison MascotMORRISEY,LEMONT, MD  Admit HPI / Brief Narrative: 80 yo female brought in to ED by family from NH after daughter heard a scream and a thump. Found patient on floor jerking both UE, foaming at mouth, with urine and fecal incontinence. Patient was brought in to the ED where it was determined she was in status epilepticus by EEG. This was refractory to 2mg  ativan.  While she has a history of seizures, these were 30 years ago and she hasnt been on seizure meds since that time.  Family reports that she has been having "odd episodes only on weekends" since May of 2015. Will sleep from Saturday to Monday refusing to eat, drink, or bathe. Personality changes. From Tuesday till the weekend she would be back to baseline family reports.  Since admission, the patient's stay has been largely uneventful.  An MRI has ruled out a structural brain issue, and an continuous EEG has ruled out ogoing seizures.  She has slowly begun to return to her baseline mental status.  Neurology has been following, and is attending to her sz med tx.  She is to be transferred to a Neuro med bed for ongoing monitoring, and to begin therapy.    HPI/Subjective: Patient is awake alert and oriented to self and place not to time. Sister at bedside, questions answered. I spoke with her granddaughter Star on the phone and all her questions answered.  Assessment/Plan:  New onset generalized status epilepticus Medication tx as per Neurology - MRI brain nondiagnostic - no evidence of seizures on continuous EEG Currently on Vimpat, Keppra and Depakote  DM2 CBG well controlled at this time - does not appear to be on med tx as outpt - A1c 7.0  Mild renal insuff Likely related to modest DH, this resolved with IV fluid hydration.  HTN BP sporadic - no change in tx today and follow trend   Hypokalemia Replace with oral supplements, keep potassium  in the high range of normal.  Dementia  Cont aricept   Microcytic anemia  Fe studies most consistent with anemia related to poor nutrition v/s "chronic disease" - follow Hgb - no indication of Fe deficiency despite microcytic nature    Code Status: FULL Family Communication: spoke w/ daughter at bedside  Disposition Plan: stable for transfer to Neuro floor - begin PT/OT to assist in D/C planning   Consultants: Neurology  Procedures: 11/26 EEG - persistent generalized epileptiform activity  11/27 EEG - no seizures per Neuro note   Antibiotics: none  DVT prophylaxis: SCDs  Objective: Blood pressure 152/72, pulse 86, temperature 98.8 F (37.1 C), temperature source Oral, resp. rate 18, height 5\' 5"  (1.651 m), weight 71.6 kg (157 lb 13.6 oz), SpO2 98 %. No intake or output data in the 24 hours ending 04/27/14 1153  Exam: General: No acute respiratory distress - alert and conversant  Lungs: Clear to auscultation bilaterally without wheezes or crackles Cardiovascular: Regular rate and rhythm without murmur gallop or rub  Abdomen: Nontender, nondistended, soft, bowel sounds positive, no rebound, no ascites, no appreciable mass Extremities: No significant cyanosis, clubbing, or edema bilateral lower extremities  Data Reviewed: Basic Metabolic Panel:  Recent Labs Lab 04/23/14 1402 04/23/14 1407 04/24/14 0510 04/25/14 0240 04/26/14 0228 04/27/14 0040  NA 142  --  142 143 139 139  K 3.5*  --  3.1* 3.5* 3.7 3.5*  CL 96  --  99 101 99 100  CO2 24  --  27 27 25 27   GLUCOSE 153*  --  103* 70 71 84  BUN 21  --  17 24* 24* 11  CREATININE 1.12*  --  1.01 1.32* 1.20* 0.80  CALCIUM 9.7  --  9.3 9.0 8.9 8.5  MG  --  1.7  --   --  1.7  --   PHOS  --  2.7  --   --   --   --     Liver Function Tests:  Recent Labs Lab 04/23/14 1402 04/25/14 0240 04/26/14 0228  AST 25 14 15   ALT 13 8 8   ALKPHOS 76 67 68  BILITOT 0.6 0.5 0.5  PROT 7.5 6.4 6.7  ALBUMIN 3.7 2.9* 2.9*    CBC:  Recent Labs Lab 04/23/14 1402 04/24/14 0510 04/25/14 0240 04/26/14 0228 04/27/14 0040  WBC 10.0 9.6 6.8 7.0 5.8  NEUTROABS 7.7  --   --  3.4  --   HGB 11.7* 10.9* 9.7* 10.4* 9.3*  HCT 36.5 33.5* 30.4* 33.1* 29.2*  MCV 76.8* 76.0* 74.9* 75.1* 74.1*  PLT 263 256 264 253 210   CBG:  Recent Labs Lab 04/26/14 1146 04/26/14 1211 04/26/14 1634 04/26/14 2149 04/27/14 0640  GLUCAP 151* 151* 81 103* 87    Recent Results (from the past 240 hour(s))  MRSA PCR Screening     Status: None   Collection Time: 04/24/14  3:06 AM  Result Value Ref Range Status   MRSA by PCR NEGATIVE NEGATIVE Final    Comment:        The GeneXpert MRSA Assay (FDA approved for NASAL specimens only), is one component of a comprehensive MRSA colonization surveillance program. It is not intended to diagnose MRSA infection nor to guide or monitor treatment for MRSA infections.      Studies:  Recent x-ray studies have been reviewed in detail by the Attending Physician  Scheduled Meds:  Scheduled Meds: . antiseptic oral rinse  7 mL Mouth Rinse BID  . atorvastatin  80 mg Oral Q2000  . clopidogrel  75 mg Oral Q breakfast  . donepezil  10 mg Oral QHS  . insulin aspart  0-9 Units Subcutaneous TID WC  . lacosamide (VIMPAT) IV  150 mg Intravenous Q12H  . levETIRAcetam  1,000 mg Intravenous Q12H  . potassium chloride  40 mEq Oral Once  . valproate sodium  500 mg Intravenous 3 times per day    Time spent on care of this patient: 35 mins   Clydia LlanoELMAHI,Donata Reddick A , MD   [facility] Office  [phone] Pager - Text Page per Loretha StaplerAmion as per below:  On-Call/Text Page:      [email or web address]      password [email or web address]  If 7PM-7AM, please contact night-coverage [email or web address] Password [email or web address] 04/27/2014, 11:53 AM   LOS: 4 days

## 2014-04-27 NOTE — Evaluation (Signed)
Occupational Therapy Evaluation and Discharge Patient Details Name: Margaret RamusBetty S Hernandez MRN: [id number] DOB: May 18, 1930 Today's Date: 04/27/2014    History of Present Illness 80 y/o with new onset generalized seiures adm after fall at home. Pt with c-EEG without electrographic seizures.   Clinical Impression   This 80 yo female admitted with above presents to acute OT with decreased balance, decreased mobility, decreased cognition, and lethargy all affecting her PLOF of independent. She will benefit from continued therapy at SNF, acute OT will sign off.    Follow Up Recommendations  SNF    Equipment Recommendations   (TBD at next venue)       Precautions / Restrictions Precautions Precautions: Fall Restrictions Weight Bearing Restrictions: No      Mobility Bed Mobility Overal bed mobility: Needs Assistance Bed Mobility: Supine to Sit     Supine to sit: Max assist;HOB elevated     General bed mobility comments: Needed VCs for sequencing through steps and then was not following them consistently  Transfers Overall transfer level: Needs assistance   Transfers: Sit to/from Stand;Stand Pivot Transfers Sit to Stand: Mod assist Stand pivot transfers: Mod assist       General transfer comment: with therapist standing in front of her    Balance Overall balance assessment: Needs assistance;History of Falls Sitting-balance support: Feet supported;No upper extremity supported Sitting balance-Leahy Scale: Poor   Postural control: Posterior lean Standing balance support: Bilateral upper extremity supported Standing balance-Leahy Scale: Poor                              ADL Overall ADL's : Needs assistance/impaired Eating/Feeding: Total assistance;Sitting;Independent (in recliner)   Grooming: Maximal assistance;Sitting (recliner)   Upper Body Bathing: Maximal assistance;Sitting (recliner)   Lower Body Bathing: Total assistance (with Mod A sit<>stand from  bed)   Upper Body Dressing : Maximal assistance;Sitting (recliner)   Lower Body Dressing: Total assistance (with Mod A sit<>stand bed)   Toilet Transfer: Moderate assistance;Stand-pivot (bed>recliner going to her left)   Toileting- Clothing Manipulation and Hygiene: Total assistance (with Mod A sit<>stand)                         Pertinent Vitals/Pain Pain Assessment: No/denies pain     Hand Dominance Right   Extremity/Trunk Assessment Upper Extremity Assessment Upper Extremity Assessment: Overall WFL for tasks assessed           Communication Communication Communication: No difficulties   Cognition Arousal/Alertness: Lethargic Behavior During Therapy: Flat affect Overall Cognitive Status: Impaired/Different from baseline Area of Impairment: Following commands;Safety/judgement;Problem solving       Following Commands: Follows one step commands inconsistently Safety/Judgement: Decreased awareness of deficits;Decreased awareness of safety   Problem Solving: Slow processing;Decreased initiation;Difficulty sequencing;Requires verbal cues;Requires tactile cues General Comments: delayed responses              Home Living Family/patient expects to be discharged to:: Skilled nursing facility                                             OT Diagnosis: Generalized weakness;Cognitive deficits   OT Problem List: Decreased strength;Decreased activity tolerance;Impaired balance (sitting and/or standing);Decreased safety awareness;Decreased cognition;Decreased knowledge of use of DME or AE      OT Goals(Current goals can be found in  the care plan section) Acute Rehab OT Goals Patient Stated Goal: did not ask  OT Frequency:                End of Session Equipment Utilized During Treatment: Gait belt Nurse Communication:  (NT: +2 mod A stand pivot with recliner next to bed)  Activity Tolerance: Patient limited by lethargy Patient left: in  chair;with call bell/phone within reach;with family/visitor present   Time: 0950-1001 OT Time Calculation (min): 11 min Charges:  OT General Charges $OT Visit: 1 Procedure OT Evaluation $Initial OT Evaluation Tier I: 1 Procedure OT Treatments $Self Care/Home Management : 8-22 mins  Evette GeorgesLeonard, Bach Rocchi Eva [phone] 04/27/2014, 10:12 AM

## 2014-04-27 NOTE — Plan of Care (Signed)
Problem: Phase I Progression Outcomes Goal: Seizure activity controlled Outcome: Completed/Met Date Met:  04/27/14 Goal: IV access obtained Outcome: Completed/Met Date Met:  04/27/14 Goal: Maintaining airway and VS stable Outcome: Completed/Met Date Met:  04/27/14 Goal: Pain controlled with appropriate interventions Outcome: Completed/Met Date Met:  04/27/14 Goal: OOB as tolerated unless otherwise ordered Outcome: Progressing Goal: Initial discharge plan identified Outcome: Completed/Met Date Met:  04/27/14 Goal: Voiding-avoid urinary catheter unless indicated Outcome: Completed/Met Date Met:  04/27/14 Goal: Hemodynamically stable Outcome: Completed/Met Date Met:  04/27/14 Goal: Other Phase I Outcomes/Goals Outcome: Progressing

## 2014-04-28 DIAGNOSIS — R569 Unspecified convulsions: Secondary | ICD-10-CM | POA: Insufficient documentation

## 2014-04-28 DIAGNOSIS — W19XXXA Unspecified fall, initial encounter: Secondary | ICD-10-CM

## 2014-04-28 LAB — BASIC METABOLIC PANEL
Anion gap: 11 (ref 5–15)
BUN: 7 mg/dL (ref 6–23)
CO2: 29 mEq/L (ref 19–32)
Calcium: 9.1 mg/dL (ref 8.4–10.5)
Chloride: 97 mEq/L (ref 96–112)
Creatinine, Ser: 0.86 mg/dL (ref 0.50–1.10)
GFR calc Af Amer: 71 mL/min — ABNORMAL LOW (ref >90)
GFR, EST NON AFRICAN AMERICAN: 61 mL/min — AB (ref >90)
GLUCOSE: 102 mg/dL — AB (ref 70–99)
Potassium: 3.6 mEq/L — ABNORMAL LOW (ref 3.7–5.3)
SODIUM: 137 meq/L (ref 137–147)

## 2014-04-28 LAB — GLUCOSE, CAPILLARY
GLUCOSE-CAPILLARY: 119 mg/dL — AB (ref 70–99)
GLUCOSE-CAPILLARY: 139 mg/dL — AB (ref 70–99)
Glucose-Capillary: 80 mg/dL (ref 70–99)
Glucose-Capillary: 105 mg/dL — ABNORMAL HIGH (ref 70–99)

## 2014-04-28 LAB — MAGNESIUM: Magnesium: 1.8 mg/dL (ref 1.5–2.5)

## 2014-04-28 MED ORDER — POTASSIUM CHLORIDE 10 MEQ/100ML IV SOLN
10.0000 meq | Freq: Once | INTRAVENOUS | Status: AC
  Administered 2014-04-28: 10 meq via INTRAVENOUS
  Filled 2014-04-28: qty 100

## 2014-04-28 MED ORDER — HYDRALAZINE HCL 20 MG/ML IJ SOLN
10.0000 mg | Freq: Four times a day (QID) | INTRAMUSCULAR | Status: DC | PRN
  Administered 2014-04-28 (×2): 10 mg via INTRAVENOUS
  Filled 2014-04-28: qty 1

## 2014-04-28 MED ORDER — MAGNESIUM SULFATE 2 GM/50ML IV SOLN
2.0000 g | Freq: Once | INTRAVENOUS | Status: AC
  Administered 2014-04-28: 2 g via INTRAVENOUS
  Filled 2014-04-28: qty 50

## 2014-04-28 MED ORDER — POTASSIUM CHLORIDE 10 MEQ/100ML IV SOLN
10.0000 meq | INTRAVENOUS | Status: AC
  Administered 2014-04-28 (×2): 10 meq via INTRAVENOUS
  Filled 2014-04-28 (×2): qty 100

## 2014-04-28 NOTE — Progress Notes (Signed)
Patient remains difficult to rouse. She was able to take serving of grits, a few sips of juice and plavix this am with strong encouragement. Patient continues with 22G at LFA, mild swelling and minimal blood return; functional at present. Contacted IV team again today to attempt additional access. IV team unsuccessful after 2 attempts. MD does not want PICC line at this time. Discussed with daughter alternative of gastric tube for medication or nutrition. Daughter states,"she doesn't want heroic measures... Is that heroic?" Encouraged daughter to talk with her brother about their mother's potential needs- and recommended they conference with MD to decide direction of care. Patient remains Full Code.

## 2014-04-28 NOTE — Progress Notes (Signed)
Order for prn hydralazine given and administered will monitor.

## 2014-04-28 NOTE — Progress Notes (Signed)
Progress Note  Margaret Hernandez AOZ:[id number] DOB: [birth year] DOA: 04/23/2014 PCP: Margaret Hernandez  Admit HPI / Brief Narrative: 80 yo female brought in to ED by family from NH after daughter heard a scream and a thump. Found patient on floor jerking both UE, foaming at mouth, with urine and fecal incontinence. Patient was brought in to the ED where it was determined she was in status epilepticus by EEG. This was refractory to 2mg  ativan.  While she has a history of seizures, these were 30 years ago and she hasnt been on seizure meds since that time.  Family reports that she has been having "odd episodes only on weekends" since May of 2015. Will sleep from Saturday to Monday refusing to eat, drink, or bathe. Personality changes. From Tuesday till the weekend she would be back to baseline family reports.  Since admission, the patient's stay has been largely uneventful.  An MRI has ruled out a structural brain issue, and an continuous EEG has ruled out ogoing seizures.  She has slowly begun to return to her baseline mental status.  Neurology has been following, and is attending to her sz med tx.  She is to be transferred to a Neuro med bed for ongoing monitoring, and to begin therapy.    HPI/Subjective: Sleepy but arouses easily to verbal stimuli, then answer questions appropriately. She is oriented to self and place not to time. Neurology signed off today, recommended to continue Vimpat and Keppra. Had discussion with the nursing staff very concerned as she does not have very good oral intake.  Assessment/Plan:  New onset generalized status epilepticus Medication tx as per Neurology - MRI brain nondiagnostic - no evidence of seizures on continuous EEG Currently on Vimpat, Keppra and discontinue Depakote per neurology recommendation. Antiepileptic monotherapy should be considered outside preferably with only Keppra.   Poor oral intake Per nursing staff patient oral intake is very  poor, for the past 2 days not able to eat and drink. I discussed with the family and they said this is not unusual for her to have episode of poor oral intake when she is sleeping. Usually her appetite will pick up when she has more awake and alert.  Lethargy Family reported that patient usually sleeps for straight 3 days. Reportedly she goes to sleep on Friday and she wakes up on Monday or Tuesday. Medications reviewed, no benzos or narcotics at home. I will discontinue Neurontin.  DM2 CBG well controlled at this time - does not appear to be on med tx as outpt - A1c 7.0  Mild renal insuff Likely related to modest DH, this resolved with IV fluid hydration.  HTN BP sporadic - no change in tx today and follow trend   Hypokalemia Patient refused oral supplementation, will replace with IV supplements.  Dementia  Cont aricept   Microcytic anemia  Fe studies most consistent with anemia related to poor nutrition v/s "chronic disease" - follow Hgb - no indication of Fe deficiency despite microcytic nature    Code Status: FULL Family Communication: spoke w/ daughter at bedside  Disposition Plan:PT/OT, discussed with granddaughter Margaret Hernandez, family interested in SNF. CSW consulted  Consultants: Neurology  Procedures: 11/26 EEG - persistent generalized epileptiform activity  11/27 EEG - no seizures per Neuro note   Antibiotics: none  DVT prophylaxis: SCDs  Objective: Blood pressure 175/73, pulse 84, temperature 97.5 F (36.4 C), temperature source Oral, resp. rate 17, height 5\' 5"  (1.651 m), weight 71.6 kg (157 lb  13.6 oz), SpO2 100 %. No intake or output data in the 24 hours ending 04/28/14 1146  Exam: General: No acute respiratory distress - alert and conversant  Lungs: Clear to auscultation bilaterally without wheezes or crackles Cardiovascular: Regular rate and rhythm without murmur gallop or rub  Abdomen: Nontender, nondistended, soft, bowel sounds positive, no  rebound, no ascites, no appreciable mass Extremities: No significant cyanosis, clubbing, or edema bilateral lower extremities  Data Reviewed: Basic Metabolic Panel:  Recent Labs Lab 04/23/14 1407 04/24/14 0510 04/25/14 0240 04/26/14 0228 04/27/14 0040 04/28/14 0245  NA  --  142 143 139 139 137  K  --  3.1* 3.5* 3.7 3.5* 3.6*  CL  --  99 101 99 100 97  CO2  --  27 27 25 27 29   GLUCOSE  --  103* 70 71 84 102*  BUN  --  17 24* 24* 11 7  CREATININE  --  1.01 1.32* 1.20* 0.80 0.86  CALCIUM  --  9.3 9.0 8.9 8.5 9.1  MG 1.7  --   --  1.7  --  1.8  PHOS 2.7  --   --   --   --   --     Liver Function Tests:  Recent Labs Lab 04/23/14 1402 04/25/14 0240 04/26/14 0228  AST 25 14 15   ALT 13 8 8   ALKPHOS 76 67 68  BILITOT 0.6 0.5 0.5  PROT 7.5 6.4 6.7  ALBUMIN 3.7 2.9* 2.9*   CBC:  Recent Labs Lab 04/23/14 1402 04/24/14 0510 04/25/14 0240 04/26/14 0228 04/27/14 0040  WBC 10.0 9.6 6.8 7.0 5.8  NEUTROABS 7.7  --   --  3.4  --   HGB 11.7* 10.9* 9.7* 10.4* 9.3*  HCT 36.5 33.5* 30.4* 33.1* 29.2*  MCV 76.8* 76.0* 74.9* 75.1* 74.1*  PLT 263 256 264 253 210   CBG:  Recent Labs Lab 04/27/14 1146 04/27/14 1639 04/27/14 2141 04/28/14 0624 04/28/14 1136  GLUCAP 136* 86 83 80 105*    Recent Results (from the past 240 hour(s))  MRSA PCR Screening     Status: None   Collection Time: 04/24/14  3:06 AM  Result Value Ref Range Status   MRSA by PCR NEGATIVE NEGATIVE Final    Comment:        The GeneXpert MRSA Assay (FDA approved for NASAL specimens only), is one component of a comprehensive MRSA colonization surveillance program. It is not intended to diagnose MRSA infection nor to guide or monitor treatment for MRSA infections.      Studies:  Recent x-ray studies have been reviewed in detail by the Attending Physician  Scheduled Meds:  Scheduled Meds: . antiseptic oral rinse  7 mL Mouth Rinse BID  . atorvastatin  80 mg Oral Q2000  . clopidogrel  75 mg  Oral Q breakfast  . donepezil  10 mg Oral QHS  . insulin aspart  0-9 Units Subcutaneous TID WC  . lacosamide (VIMPAT) IV  150 mg Intravenous Q12H  . levETIRAcetam  1,000 mg Intravenous Q12H  . valproate sodium  500 mg Intravenous 3 times per day    Time spent on care of this patient: 35 mins   Clydia LlanoELMAHI,Sharmila Wrobleski A , Hernandez   [facility] Office  [phone] Pager - Text Page per Loretha StaplerAmion as per below:  On-Call/Text Page:      [email or web address]      password [email or web address]  If 7PM-7AM, please contact night-coverage [email or web address] Password [email or web address] 04/28/2014, 11:46 AM   LOS: 5  days

## 2014-04-28 NOTE — Progress Notes (Signed)
NEURO HOSPITALIST PROGRESS NOTE   SUBJECTIVE:                                                                                                                        Resting comfortably in bed. Answering questions appropriately. No further seizures noted. On vimpat, keppra, and depakote. MRI brain showed no acute abnormality.  OBJECTIVE:                                                                                                                           Vital signs in last 24 hours: Temp:  [97 F (36.1 C)-98.8 F (37.1 C)] 97 F (36.1 C) (11/29 0311) Pulse Rate:  [69-87] 71 (11/29 0623) Resp:  [17-18] 18 (11/29 0623) BP: (133-191)/(63-88) 143/88 mmHg (11/29 0623) SpO2:  [97 %-100 %] 99 % (11/29 0623)  Intake/Output from previous day:   Intake/Output this shift:   Nutritional status: DIET SOFT  Past Medical History  Diagnosis Date  . Diabetes mellitus without complication   . Hypertension   . Dementia   Physical exam: pleasant female in no apparent distress. Head: normocephalic. Neck: supple, no bruits, no JVD. Cardiac: no murmurs. Lungs: clear. Abdomen: soft, no tender, no mass. Extremities: no edema.  Neurologic Exam:  General: Mental Status: Alert, awake, oriented x 3. Speech fluent without evidence of aphasia. Able to follow 3 step commands without difficulty. Cranial Nerves: II: Discs flat bilaterally; Visual fields grossly normal, pupils equal, round, reactive to light and accommodation III,IV, VI: ptosis not present, extra-ocular motions intact bilaterally V,VII: smile symmetric, facial light touch sensation normal bilaterally VIII: hearing normal bilaterally IX,X: gag reflex present XI: bilateral shoulder shrug XII: midline tongue extension without atrophy or fasciculations  Motor: Right :Upper extremity 5/5Left: Upper extremity 5/5 Lower extremity  5/5Lower extremity 5/5 Tone and bulk:normal tone throughout; no atrophy noted Sensory: Pinprick and light touch intact throughout, bilaterally Deep Tendon Reflexes:  Right: Upper Extremity Left: Upper extremity   biceps (C-5 to C-6) 2/4 biceps (C-5 to C-6) 2/4 tricep (C7) 2/4triceps (C7) 2/4 Brachioradialis (C6) 2/4Brachioradialis (C6) 2/4  Lower Extremity Lower Extremity  quadriceps (L-2 to L-4) 2/4 quadriceps (L-2 to L-4) 2/4 Achilles (S1) 2/4Achilles (S1) 2/4  Plantars: Right: downgoingLeft: downgoing Cerebellar: normal  finger-to-nose, normal heel-to-shin test Gait:  No tested  Lab Results: No results found for: CHOL Lipid Panel No results for input(s): CHOL, TRIG, HDL, CHOLHDL, VLDL, LDLCALC in the last 72 hours.  Studies/Results: No results found.  MEDICATIONS                                                                                                                        Scheduled: . antiseptic oral rinse  7 mL Mouth Rinse BID  . atorvastatin  80 mg Oral Q2000  . clopidogrel  75 mg Oral Q breakfast  . donepezil  10 mg Oral QHS  . insulin aspart  0-9 Units Subcutaneous TID WC  . lacosamide (VIMPAT) IV  150 mg Intravenous Q12H  . levETIRAcetam  1,000 mg Intravenous Q12H  . valproate sodium  500 mg Intravenous 3 times per day    ASSESSMENT/PLAN:                                                                                                           80 y/o with new onset seizures presenting as generalized SE which had resolved. She seems to be back to baseline Will discontinue Depakote and continue keppra and vimpat. Eventually, seizure control with monotherapy should be attempted in the outpatient setting, preferably  keppra. Neurology will sign off.   Wyatt Portelasvaldo Chanah Tidmore, MD [facility] [phone]  04/28/2014, 9:45 AM

## 2014-04-28 NOTE — Progress Notes (Signed)
Patients B/P 188/78 call to N.P to make aware.

## 2014-04-28 NOTE — Progress Notes (Signed)
B/P down to 133/63 pulse 70.

## 2014-04-29 ENCOUNTER — Telehealth: Payer: Self-pay | Admitting: Neurology

## 2014-04-29 LAB — COMPREHENSIVE METABOLIC PANEL
ALT: 8 U/L (ref 0–35)
AST: 18 U/L (ref 0–37)
Albumin: 2.8 g/dL — ABNORMAL LOW (ref 3.5–5.2)
Alkaline Phosphatase: 58 U/L (ref 39–117)
Anion gap: 12 (ref 5–15)
BUN: 8 mg/dL (ref 6–23)
CALCIUM: 8.9 mg/dL (ref 8.4–10.5)
CO2: 28 mEq/L (ref 19–32)
Chloride: 97 mEq/L (ref 96–112)
Creatinine, Ser: 0.84 mg/dL (ref 0.50–1.10)
GFR calc Af Amer: 73 mL/min — ABNORMAL LOW (ref >90)
GFR calc non Af Amer: 63 mL/min — ABNORMAL LOW (ref >90)
Glucose, Bld: 85 mg/dL (ref 70–99)
Potassium: 4 mEq/L (ref 3.7–5.3)
SODIUM: 137 meq/L (ref 137–147)
TOTAL PROTEIN: 6 g/dL (ref 6.0–8.3)
Total Bilirubin: 0.3 mg/dL (ref 0.3–1.2)

## 2014-04-29 LAB — RPR

## 2014-04-29 LAB — VITAMIN B12: Vitamin B-12: 639 pg/mL (ref 211–911)

## 2014-04-29 LAB — MAGNESIUM: Magnesium: 2.2 mg/dL (ref 1.5–2.5)

## 2014-04-29 LAB — GLUCOSE, CAPILLARY
GLUCOSE-CAPILLARY: 105 mg/dL — AB (ref 70–99)
GLUCOSE-CAPILLARY: 99 mg/dL (ref 70–99)
Glucose-Capillary: 98 mg/dL (ref 70–99)

## 2014-04-29 MED ORDER — MODAFINIL 100 MG PO TABS: 100.0000 mg | ORAL_TABLET | Freq: Every day | ORAL | Status: DC

## 2014-04-29 MED ORDER — ADULT MULTIVITAMIN W/MINERALS CH
1.0000 | ORAL_TABLET | Freq: Every day | ORAL | Status: DC
  Administered 2014-04-29 – 2014-04-30 (×2): 1 via ORAL
  Filled 2014-04-29 (×2): qty 1

## 2014-04-29 MED ORDER — MODAFINIL 100 MG PO TABS
200.0000 mg | ORAL_TABLET | Freq: Every day | ORAL | Status: DC
  Administered 2014-04-29 – 2014-04-30 (×2): 200 mg via ORAL
  Filled 2014-04-29 (×2): qty 2

## 2014-04-29 MED ORDER — LACOSAMIDE 50 MG PO TABS
150.0000 mg | ORAL_TABLET | Freq: Two times a day (BID) | ORAL | Status: DC
  Administered 2014-04-29 – 2014-04-30 (×2): 150 mg via ORAL
  Filled 2014-04-29 (×2): qty 3

## 2014-04-29 MED ORDER — LEVETIRACETAM 500 MG PO TABS
1000.0000 mg | ORAL_TABLET | Freq: Two times a day (BID) | ORAL | Status: DC
  Administered 2014-04-29 – 2014-04-30 (×2): 1000 mg via ORAL
  Filled 2014-04-29 (×2): qty 2

## 2014-04-29 NOTE — Clinical Social Work Placement (Addendum)
Clinical Social Work Department CLINICAL SOCIAL WORK PLACEMENT NOTE 04/29/2014  Patient:  Alfonso RamusORAIN,Timaya S  Account Number:  [id number] Admit date:  04/23/2014  Clinical Social Worker:  Gwynne EdingerYSHEKA Keysi Oelkers, LCSWA  Date/time:  04/29/2014 12:43 PM  Clinical Social Work is seeking post-discharge placement for this patient at the following level of care:   SKILLED NURSING   (*CSW will update this form in Epic as items are completed)   04/29/2014  Patient/family provided with [facility] Department of Clinical Social Work's list of facilities offering this level of care within the geographic area requested by the patient (or if unable, by the patient's family).  04/29/2014  Patient/family informed of their freedom to choose among providers that offer the needed level of care, that participate in Medicare, Medicaid or managed care program needed by the patient, have an available bed and are willing to accept the patient.  04/29/2014  Patient/family informed of [facility]' ownership interest in [facility], as well as of the fact that they are under no obligation to receive care at this facility.  PASARR submitted to EDS on 04/29/2014 PASARR number received on 04/29/2014  FL2 transmitted to all facilities in geographic area requested by pt/family on  04/29/2014 FL2 transmitted to all facilities within larger geographic area on 04/29/2014  Patient informed that his/her managed care company has contracts with or will negotiate with  certain facilities, including the following:     Patient/family informed of bed offers received:  04/29/2014 [facility] Physician recommends and patient chooses bed at    [facility]  Patient to be transferred to facility by [facility]  Patient and family notified of transfer on 04/30/2014 Name of family member notified:  Pt's daughter Tiffany KocherLinda Lee   The following physician  request were entered in Epic:   Additional Comments:  Paiton Fosco, MSW, LCSWA [phone]

## 2014-04-29 NOTE — Progress Notes (Signed)
CARE MANAGEMENT NOTE 04/29/2014  Patient:  Margaret RamusORAIN,Margaret Hernandez   Account Number:  [id number]  Date Initiated:  04/29/2014  Documentation initiated by:  [facility],Mance Vallejo  Subjective/Objective Assessment:   New onset generalized status epilepticus     Action/Plan:   SNF   Anticipated DC Date:  04/30/2014   Anticipated DC Plan:  SKILLED NURSING FACILITY  In-house referral  Clinical Social Worker      DC Planning Services  CM consult      Choice offered to / List presented to:             Status of service:  Completed, signed off Medicare Important Message given?   (If response is "NO", the following Medicare IM given date fields will be blank) Date Medicare IM given:   Medicare IM given by:   Date Additional Medicare IM given:   Additional Medicare IM given by:    Discharge Disposition:  SKILLED NURSING FACILITY  Per UR Regulation:    If discussed at Long Length of Stay Meetings, dates discussed:    Comments:  04/29/2014 1630 Planned dc to SNF. Isidoro DonningAlesia Jenessa Gillingham RN CCM Case Mgmt phone [phone]

## 2014-04-29 NOTE — Clinical Social Work Psychosocial (Signed)
Clinical Social Work Department BRIEF PSYCHOSOCIAL ASSESSMENT 04/29/2014  Patient:  Margaret Hernandez, Margaret Hernandez     Account Number:  [id number]     Admit date:  04/23/2014  Clinical Social Worker:  Marciano Sequin  Date/Time:  04/29/2014 12:25 PM  Referred by:  RN  Date Referred:  04/29/2014 Referred for  SNF Placement   Other Referral:   Interview type:  Family Other interview type:   Pt is disoriented; Jory Sims [phone]    PSYCHOSOCIAL DATA Living Status:  WITH ADULT CHILDREN Admitted from facility:   Level of care:   Primary support name:  Jory Sims Primary support relationship to patient:  CHILD, ADULT Degree of support available:   Strong Support System    CURRENT CONCERNS Current Concerns  None Noted   Other Concerns:    SOCIAL WORK ASSESSMENT / PLAN CSW met the pt's daughter Vaughan Basta at bedside. CSW introduced self and purpose of the visit. CSW and Vaughan Basta discussed the clinical team's recommendations for rehab.  Vaughan Basta reported that the pt is linked with [facility] and there are two SNFs the pt can transition to: [facility] and [facility]. Vaughan Basta reported wanting the pt to transition to [facility]. CSW explained the SNF process to [place]. CSW answered all questions in which Vaughan Basta inquired about. CSW provided Merna with contact information for further questions. CSW will continue to follow this pt and assist with discharge as needed.   Assessment/plan status:  Psychosocial Support/Ongoing Assessment of Needs Other assessment/ plan:   Information/referral to community resources:    PATIENT'S/FAMILY'S RESPONSE TO PLAN OF CARE: Vaughan Basta was pleased to hear that [facility] the pt. Vaughan Basta reporting wanting the pt to rehab before coming home.    [place], MSW, [place]

## 2014-04-29 NOTE — Progress Notes (Signed)
Physical Therapy Treatment Patient Details Name: Margaret RamusBetty S Hernandez MRN: [id number] DOB: [birth year] Today's Date: 04/29/2014    History of Present Illness 80 y/o with new onset generalized seiures adm after fall at home. Pt with c-EEG without electrographic seizures.    PT Comments    Patient was alert enough today to transfer to the recliner but still requiring tactile and constant stimuli to keep eyes open. Patient encouraged to sit up for at least one hour with family support present. Continue to recommend SNF for ongoing Physical Therapy.     Follow Up Recommendations  SNF     Equipment Recommendations  Other (comment) (TBD)    Recommendations for Other Services       Precautions / Restrictions Precautions Precautions: Fall Restrictions Weight Bearing Restrictions: No    Mobility  Bed Mobility Overal bed mobility: Needs Assistance Bed Mobility: Supine to Sit     Supine to sit: +2 for physical assistance;Max assist     General bed mobility comments: Needed VCs for sequencing through steps and required +2 Max assist for LEs and for trunk support into sitting  Transfers Overall transfer level: Needs assistance   Transfers: Sit to/from Stand;Stand Pivot Transfers Sit to Stand: +2 physical assistance;Mod assist Stand pivot transfers: +2 physical assistance;Mod assist       General transfer comment: A for all aspects of transfer. Patient not attempting to pivot LEs with transfer  Ambulation/Gait             General Gait Details: NT   Stairs            Wheelchair Mobility    Modified Rankin (Stroke Patients Only)       Balance     Sitting balance-Leahy Scale: Poor Sitting balance - Comments: required outside assist to prevent posterior lean     Standing balance-Leahy Scale: Poor                      Cognition Arousal/Alertness: Lethargic Behavior During Therapy: Flat affect Overall Cognitive Status: Impaired/Different from  baseline Area of Impairment: Following commands;Safety/judgement;Problem solving     Memory: Decreased short-term memory Following Commands: Follows one step commands inconsistently Safety/Judgement: Decreased awareness of deficits;Decreased awareness of safety   Problem Solving: Slow processing;Decreased initiation;Difficulty sequencing;Requires verbal cues;Requires tactile cues General Comments: continues with delayed responses    Exercises      General Comments        Pertinent Vitals/Pain Pain Assessment: No/denies pain    Home Living                      Prior Function            PT Goals (current goals can now be found in the care plan section) Progress towards PT goals: Progressing toward goals    Frequency  Min 3X/week    PT Plan Current plan remains appropriate    Co-evaluation             End of Session Equipment Utilized During Treatment: Gait belt Activity Tolerance: Patient limited by fatigue Patient left: in chair;with call bell/phone within reach;with family/visitor present     Time: [id number] PT Time Calculation (min) (ACUTE ONLY): 17 min  Charges:  $Therapeutic Activity: 8-22 mins                    G Codes:      Fredrich BirksRobinette, Julia Elizabeth 04/29/2014, 2:40 PM 04/29/2014 Robinette, Adline PotterJulia Elizabeth PTA  [phone] pager [phone] office

## 2014-04-29 NOTE — Telephone Encounter (Signed)
[place] [place] [facility] for [facility] on Ms. Candelaria. She is still admitted to hospital, no further seizures on EEG, plan to discharge on Vimpat and Keppra. She was having the episodes of lethargy and not eating with no changes seen on EEG, ?etiology. Discussed plan for possible monotherapy in the future, will repeat an EEG as outpatient, then plan to taper Vimpat if EEG unremarkable.  F/u as scheduled

## 2014-04-29 NOTE — Progress Notes (Signed)
Progress Note  Alfonso RamusBetty S Zucker JXB:[id number] DOB: [birth year] DOA: 04/23/2014 PCP: Dennison MascotMORRISEY,LEMONT, MD  Admit HPI / Brief Narrative: 80 yo female brought in to ED by family after she was found on the floor. Found patient on floor jerking both UE, foaming at mouth, with urine and fecal incontinence. Patient was brought in to the ED where it was determined she was in status epilepticus by EEG. This was refractory to 2mg  ativan.  While she has a history of seizures, these were 30 years ago and she hasnt been on seizure meds since that time.  Family reports that she has been having "odd episodes only on weekends" since May of 2015. Will sleep from Saturday to Monday refusing to eat, drink, or bathe. Personality changes. From Tuesday till the weekend she would be back to baseline family reports.  Since admission, the patient's stay has been largely uneventful.  An MRI has ruled out a structural brain issue, and an continuous EEG has ruled out ogoing seizures.  She has slowly begun to return to her baseline mental status.  Neurology has been following, and is attending to her sz med tx.  She is to be transferred to a Neuro med bed for ongoing monitoring, and to begin therapy.    HPI/Subjective: Sleepy, daughter Bonita QuinLinda at bedside. Questions answered.   Assessment/Plan:  New onset generalized status epilepticus Medication tx as per Neurology - MRI brain nondiagnostic - no evidence of seizures on continuous EEG Currently on Vimpat, Keppra and discontinue Depakote per neurology recommendation. Antiepileptic monotherapy should be considered outside preferably with only Keppra.   Poor oral intake Per nursing staff patient oral intake is very poor, for the past 2 days not able to eat and drink. I discussed with the family and they said this is not unusual for her to have episode of poor oral intake when she is sleeping. Usually her appetite will pick up when she has more awake and  alert.  Lethargy Family reported that patient usually sleeps for straight 3 days. Reportedly she goes to sleep on Friday and she wakes up on Monday or Tuesday. Medications reviewed, no benzos or narcotics at home. I will discontinue Neurontin. MRI and EEG were both negative. Ammonia and folate was normal. I'll check RPR and B12. UDS is negative. I started her on modafinil.  DM2 CBG well controlled at this time - does not appear to be on med tx as outpt - A1c 7.0  Mild renal insuff Likely related to modest DH, this resolved with IV fluid hydration.  HTN BP sporadic - no change in tx today and follow trend   Hypokalemia Patient refused oral supplementation, will replace with IV supplements.  Dementia  Cont aricept   Microcytic anemia  Fe studies most consistent with anemia related to poor nutrition v/s "chronic disease" - follow Hgb - no indication of Fe deficiency despite microcytic nature    Code Status: FULL Family Communication: spoke w/ daughter at bedside  Disposition Plan:PT/OT, discussed with granddaughter [name], family interested in SNF. CSW consulted  Consultants: Neurology  Procedures: 11/26 EEG - persistent generalized epileptiform activity  11/27 EEG - no seizures per Neuro note   Antibiotics: none  DVT prophylaxis: SCDs  Objective: Blood pressure 148/70, pulse 84, temperature 97.9 F (36.6 C), temperature source Axillary, resp. rate 16, height 5\' 5"  (1.651 m), weight 71.6 kg (157 lb 13.6 oz), SpO2 99 %. No intake or output data in the 24 hours ending 04/29/14 1240  Exam:  General: No acute respiratory distress - alert and conversant  Lungs: Clear to auscultation bilaterally without wheezes or crackles Cardiovascular: Regular rate and rhythm without murmur gallop or rub  Abdomen: Nontender, nondistended, soft, bowel sounds positive, no rebound, no ascites, no appreciable mass Extremities: No significant cyanosis, clubbing, or edema bilateral  lower extremities  Data Reviewed: Basic Metabolic Panel:  Recent Labs Lab 04/23/14 1407  04/25/14 0240 04/26/14 0228 04/27/14 0040 04/28/14 0245 04/29/14 0330  NA  --   < > 143 139 139 137 137  K  --   < > 3.5* 3.7 3.5* 3.6* 4.0  CL  --   < > 101 99 100 97 97  CO2  --   < > 27 25 27 29 28   GLUCOSE  --   < > 70 71 84 102* 85  BUN  --   < > 24* 24* 11 7 8   CREATININE  --   < > 1.32* 1.20* 0.80 0.86 0.84  CALCIUM  --   < > 9.0 8.9 8.5 9.1 8.9  MG 1.7  --   --  1.7  --  1.8 2.2  PHOS 2.7  --   --   --   --   --   --   < > = values in this interval not displayed.  Liver Function Tests:  Recent Labs Lab 04/23/14 1402 04/25/14 0240 04/26/14 0228 04/29/14 0330  AST 25 14 15 18   ALT 13 8 8 8   ALKPHOS 76 67 68 58  BILITOT 0.6 0.5 0.5 0.3  PROT 7.5 6.4 6.7 6.0  ALBUMIN 3.7 2.9* 2.9* 2.8*   CBC:  Recent Labs Lab 04/23/14 1402 04/24/14 0510 04/25/14 0240 04/26/14 0228 04/27/14 0040  WBC 10.0 9.6 6.8 7.0 5.8  NEUTROABS 7.7  --   --  3.4  --   HGB 11.7* 10.9* 9.7* 10.4* 9.3*  HCT 36.5 33.5* 30.4* 33.1* 29.2*  MCV 76.8* 76.0* 74.9* 75.1* 74.1*  PLT 263 256 264 253 210   CBG:  Recent Labs Lab 04/28/14 1136 04/28/14 1639 04/28/14 2149 04/29/14 0623 04/29/14 1155  GLUCAP 105* 119* 139* 105* 98    Recent Results (from the past 240 hour(s))  MRSA PCR Screening     Status: None   Collection Time: 04/24/14  3:06 AM  Result Value Ref Range Status   MRSA by PCR NEGATIVE NEGATIVE Final    Comment:        The GeneXpert MRSA Assay (FDA approved for NASAL specimens only), is one component of a comprehensive MRSA colonization surveillance program. It is not intended to diagnose MRSA infection nor to guide or monitor treatment for MRSA infections.      Studies:  Recent x-ray studies have been reviewed in detail by the Attending Physician  Scheduled Meds:  Scheduled Meds: . antiseptic oral rinse  7 mL Mouth Rinse BID  . atorvastatin  80 mg Oral Q2000  .  clopidogrel  75 mg Oral Q breakfast  . donepezil  10 mg Oral QHS  . insulin aspart  0-9 Units Subcutaneous TID WC  . lacosamide (VIMPAT) IV  150 mg Intravenous Q12H  . levETIRAcetam  1,000 mg Intravenous Q12H  . valproate sodium  500 mg Intravenous 3 times per day    Time spent on care of this patient: 35 mins   Clydia LlanoELMAHI,Lakenzie Mcclafferty A , MD   [facility] Office  [phone] Pager - Text Page per Loretha StaplerAmion as per below:  On-Call/Text Page:  [email or web address]      password [email or web address]  If 7PM-7AM, please contact night-coverage [email or web address] Password [email or web address] 04/29/2014, 12:40 PM   LOS: 6 days

## 2014-04-29 NOTE — Progress Notes (Signed)
Family states that she is herself all week but on Friday she sleeps until late Monday or Tuesday then she will wakes up. Family says that she has been dealing with a lot [place] and every since the move she hasn't been herself.

## 2014-04-30 LAB — GLUCOSE, CAPILLARY: Glucose-Capillary: 124 mg/dL — ABNORMAL HIGH (ref 70–99)

## 2014-04-30 MED ORDER — LACOSAMIDE 150 MG PO TABS: 150.0000 mg | ORAL_TABLET | Freq: Two times a day (BID) | ORAL | Status: DC

## 2014-04-30 MED ORDER — MODAFINIL 200 MG PO TABS: 200.0000 mg | ORAL_TABLET | Freq: Every day | ORAL | Status: DC

## 2014-04-30 MED ORDER — LEVETIRACETAM 1000 MG PO TABS: 1000.0000 mg | ORAL_TABLET | Freq: Two times a day (BID) | ORAL | Status: DC

## 2014-04-30 NOTE — Progress Notes (Signed)
Pt d/c to SNF by ambulance. Assessment stable. [facility].

## 2014-04-30 NOTE — Clinical Social Work Note (Signed)
CSW reviewed chart. Once pt is medically stable she can transition to [facility]. CSW will continue to follow and assist with discharge needs.   Marshawn Normoyle, MSW, LCSWA [phone]

## 2014-04-30 NOTE — Discharge Summary (Signed)
Physician Discharge Summary  Margaret Hernandez WUJ:[id number] DOB: 1929/09/24 DOA: 04/23/2014  PCP: Dennison Mascot, MD  Admit date: 04/23/2014 Discharge date: 04/30/2014  Time spent: 40 minutes  Recommendations for Outpatient Follow-up:   Follow up with the nursing home MD. isn't is getting discharged to [facility]  F/U with neurology as out pt in 1-2 weeks for further adjustment of the AE medications.  Discharge Diagnoses:  Principal Problem:   Status epilepticus Active Problems:   HBP (high blood pressure)   Diabetes type 2, uncontrolled   Dementia with behavioral disturbance   Status epilepticus, generalized convulsive   Seizure   Fall due to seizure   Discharge Condition: Stable  Diet recommendation: heart healthy  Filed Weights   04/24/14 0300  Weight: 71.6 kg (157 lb 13.6 oz)    History of present illness:  Margaret Hernandez is a 80 y.o. female brought in to ED by family from NH after daughter heard a scream and a thump. Found patient on floor jerking both UE, foaming at mouth, urine and fecal incontinence. Patient had collapsed and was brought in to the ED where it was determined she was in status epilepticus by EEG. This has been refractory to 2mg  ativan.  The history prior to this is very odd and does not at all correlate with her current status epilepticus. Family reports that she has been having odd episodes "only on weekends" since May of 2015. Will sleep from Saturday to Monday refusing to eat, drink, or bathe. Personality changes. From Tuesday till the weekend she would be back to baseline family reports.  While she has a history of seizures, these were 30 years ago and she hasnt been on seizure meds since that time.  Hospital Course:   New onset generalized status epilepticus Medication tx as per Neurology - MRI brain nondiagnostic - no evidence of seizures on continuous EEG Currently on Vimpat, Keppra and Depakote discontinued  per  neurology recommendation. Antiepileptic monotherapy should be considered outside preferably with only Keppra. Per neurology recommendation.   Poor oral intake Per nursing staff patient oral intake is very poor, for the past 2 days not able to eat and drink. I discussed with the family and they said this is not unusual for her to have episode of poor oral intake when she is sleeping. Usually her appetite picks up when she has more awake and alert.  patient reported she ate half of her breakfast today.  Lethargy Family reported that patient usually sleeps for straight 3 days. Reportedly she goes to sleep on Friday and she wakes up on Monday or Tuesday. Medications reviewed, no benzos or narcotics at home. I will discontinue Neurontin. MRI and EEG were both negative.  ammonia, folate, RPR and B12 were  normal.UDS is negative. I started her on modafinil. she is more awake and alert today on the day of D/C.   DM2 CBG well controlled at this time - does not appear to be on med tx as outpt - A1c 7.0  Acute kidney injury  Likely related to modest DH, this resolved with IV fluid hydration.  HTN BP spo, blood pressure medications were held during this hospital stay but blood pressure was trending up. On discharge restart his amlodipine and lisinopril. Chlorthalidone discontinued.  Hypokalemia Patient refused oral supplementation, will replace with IV supplements.  Dementia  Cont aricept   Microcytic anemia  Fe studies most consistent with anemia related to poor nutrition v/s "chronic disease" - follow Hgb - no  indication of Fe deficiency despite microcytic nature    Consultants: Neurology  Procedures: 11/26 EEG - persistent generalized epileptiform activity  11/27 EEG - no seizures per Neuro note   Discharge Exam: Filed Vitals:   04/30/14 0627  BP: 174/71  Pulse: 78  Temp: 98.1 F (36.7 C)  Resp: 22   General: Daughter at bedside, more awake and alert today. HEENT:  anicteric sclera, pupils reactive to light and accommodation, EOMI CVS: S1-S2 clear, no murmur rubs or gallops Chest: clear to auscultation bilaterally, no wheezing, rales or rhonchi Abdomen: soft nontender, nondistended, normal bowel sounds, no organomegaly Extremities: no cyanosis, clubbing or edema noted bilaterally Neuro: Cranial nerves II-XII intact, no focal neurological deficits   Discharge Instructions You were cared for by a hospitalist during your hospital stay. If you have any questions about your discharge medications or the care you received while you were in the hospital after you are discharged, you can call the unit and asked to speak with the hospitalist on call if the hospitalist that took care of you is not available. Once you are discharged, your primary care physician will handle any further medical issues. Please note that NO REFILLS for any discharge medications will be authorized once you are discharged, as it is imperative that you return to your primary care physician (or establish a relationship with a primary care physician if you do not have one) for your aftercare needs so that they can reassess your need for medications and monitor your lab values.  Discharge Instructions    Diet - low sodium heart healthy    Complete by:  As directed      Increase activity slowly    Complete by:  As directed           Current Discharge Medication List    START taking these medications   Details  lacosamide 150 MG TABS Take 1 tablet (150 mg total) by mouth 2 (two) times daily.    levETIRAcetam (KEPPRA) 1000 MG tablet Take 1 tablet (1,000 mg total) by mouth 2 (two) times daily.    modafinil (PROVIGIL) 200 MG tablet Take 1 tablet (200 mg total) by mouth daily.      CONTINUE these medications which have NOT CHANGED   Details  amLODipine (NORVASC) 5 MG tablet Take 5 mg by mouth daily.    atorvastatin (LIPITOR) 80 MG tablet Take 80 mg by mouth daily.    Cholecalciferol  (VITAMIN D3) 50000 UNITS CAPS Take 1 capsule by mouth once a week. Once weekly for 13 weeks    clopidogrel (PLAVIX) 75 MG tablet Take 75 mg by mouth daily with breakfast.    donepezil (ARICEPT) 10 MG tablet Take 10 mg by mouth at bedtime.    hydrocortisone cream 1 % Apply 1 application topically 2 (two) times daily as needed for itching (rash on arms).    lisinopril (PRINIVIL,ZESTRIL) 10 MG tablet Take 10 mg by mouth daily.      STOP taking these medications     chlorthalidone (HYGROTON) 25 MG tablet      gabapentin (NEURONTIN) 100 MG capsule        No Known Allergies    The results of significant diagnostics from this hospitalization (including imaging, microbiology, ancillary and laboratory) are listed below for reference.    Significant Diagnostic Studies: Ct Cervical Spine Wo Contrast  04/23/2014   CLINICAL DATA:  Seizures, status post fall  EXAM: CT CERVICAL SPINE WITHOUT CONTRAST  TECHNIQUE: Multidetector CT imaging of  the cervical spine was performed without intravenous contrast. Multiplanar CT image reconstructions were also generated.  COMPARISON:  None.  FINDINGS: The alignment is anatomic. The vertebral body heights are maintained. There is no acute fracture. There is no static listhesis. The prevertebral soft tissues are normal. The intraspinal soft tissues are not fully imaged on this examination due to poor soft tissue contrast, but there is no gross soft tissue abnormality.  There is degenerative disc disease at C4-5, C5-6 and C6-7. There are broad-based disc osteophyte complexes at C4-5 and C5-6 with bilateral facet arthropathy and uncovertebral degenerative changes resulting in foraminal encroachment. There is a mild broad-based disc osteophyte complex at C6-7.  The visualized portions of the lung apices demonstrate no focal abnormality.  IMPRESSION: 1. No acute osseous injury of the cervical spine.   Electronically Signed   By: Elige KoHetal  Patel   On: 04/23/2014 19:49   Mr  Brain W Wo Contrast  04/24/2014   CLINICAL DATA:  Seizure  EXAM: MRI HEAD WITHOUT AND WITH CONTRAST  TECHNIQUE: Multiplanar, multiecho pulse sequences of the brain and surrounding structures were obtained without and with intravenous contrast.  CONTRAST:  15mL MULTIHANCE GADOBENATE DIMEGLUMINE 529 MG/ML IV SOLN  COMPARISON:  MRI 01/14/2014  FINDINGS: Image quality degraded by mild motion.  Negative for acute infarct.  Patchy mild hyperintensity in the white matter is stable and most consistent with chronic microvascular ischemia. Brainstem and cerebellum intact.  Negative for hemorrhage.  Negative for mass lesion.  Postcontrast imaging degraded by motion however no enhancing lesions are identified.  Paranasal sinuses are clear.  IMPRESSION: Mild chronic microvascular ischemic change in the white matter. No acute abnormality.   Electronically Signed   By: Marlan Palauharles  Clark M.D.   On: 04/24/2014 11:59    Microbiology: Recent Results (from the past 240 hour(s))  MRSA PCR Screening     Status: None   Collection Time: 04/24/14  3:06 AM  Result Value Ref Range Status   MRSA by PCR NEGATIVE NEGATIVE Final    Comment:        The GeneXpert MRSA Assay (FDA approved for NASAL specimens only), is one component of a comprehensive MRSA colonization surveillance program. It is not intended to diagnose MRSA infection nor to guide or monitor treatment for MRSA infections.      Labs: Basic Metabolic Panel:  Recent Labs Lab 04/23/14 1407  04/25/14 0240 04/26/14 0228 04/27/14 0040 04/28/14 0245 04/29/14 0330  NA  --   < > 143 139 139 137 137  K  --   < > 3.5* 3.7 3.5* 3.6* 4.0  CL  --   < > 101 99 100 97 97  CO2  --   < > 27 25 27 29 28   GLUCOSE  --   < > 70 71 84 102* 85  BUN  --   < > 24* 24* 11 7 8   CREATININE  --   < > 1.32* 1.20* 0.80 0.86 0.84  CALCIUM  --   < > 9.0 8.9 8.5 9.1 8.9  MG 1.7  --   --  1.7  --  1.8 2.2  PHOS 2.7  --   --   --   --   --   --   < > = values in this interval  not displayed. Liver Function Tests:  Recent Labs Lab 04/23/14 1402 04/25/14 0240 04/26/14 0228 04/29/14 0330  AST 25 14 15 18   ALT 13 8 8 8   ALKPHOS 76  67 68 58  BILITOT 0.6 0.5 0.5 0.3  PROT 7.5 6.4 6.7 6.0  ALBUMIN 3.7 2.9* 2.9* 2.8*   No results for input(s): LIPASE, AMYLASE in the last 168 hours. No results for input(s): AMMONIA in the last 168 hours. CBC:  Recent Labs Lab 04/23/14 1402 04/24/14 0510 04/25/14 0240 04/26/14 0228 04/27/14 0040  WBC 10.0 9.6 6.8 7.0 5.8  NEUTROABS 7.7  --   --  3.4  --   HGB 11.7* 10.9* 9.7* 10.4* 9.3*  HCT 36.5 33.5* 30.4* 33.1* 29.2*  MCV 76.8* 76.0* 74.9* 75.1* 74.1*  PLT 263 256 264 253 210   Cardiac Enzymes: No results for input(s): CKTOTAL, CKMB, CKMBINDEX, TROPONINI in the last 168 hours. BNP: BNP (last 3 results) No results for input(s): PROBNP in the last 8760 hours. CBG:  Recent Labs Lab 04/28/14 2149 04/29/14 0623 04/29/14 1155 04/29/14 1627 04/29/14 2150  GLUCAP 139* 105* 98 99 124*       Signed:  Shereena Berquist A  [facility] 04/30/2014, 9:38 AM

## 2014-05-01 ENCOUNTER — Other Ambulatory Visit: Payer: Medicare (Managed Care)

## 2014-05-01 LAB — GLUCOSE, CAPILLARY: Glucose-Capillary: 111 mg/dL — ABNORMAL HIGH (ref 70–99)

## 2014-05-21 ENCOUNTER — Telehealth: Payer: Self-pay | Admitting: Neurology

## 2014-05-21 NOTE — Telephone Encounter (Signed)
[name] [facility] needs to speak with Dr Karel JarvisAquino  Please call her at [phone]

## 2014-05-21 NOTE — Telephone Encounter (Signed)
[name]

## 2014-05-22 NOTE — Telephone Encounter (Signed)
[facility] [place] Ms. [name] doing well with no further seizures, no episodes of decreased responsiveness. She was also started on Provigil. They plan to dc her back home soon. Discussed potential taper of Vimpat in the future, would do routine EEG first.  Annabelle HarmanDana, can you pls schedule her for a routine EEG next week? All the appointments go through [facility],  Thanks

## 2014-06-04 ENCOUNTER — Ambulatory Visit (INDEPENDENT_AMBULATORY_CARE_PROVIDER_SITE_OTHER): Payer: Medicare (Managed Care) | Admitting: Neurology

## 2014-06-04 DIAGNOSIS — R569 Unspecified convulsions: Secondary | ICD-10-CM | POA: Diagnosis not present

## 2014-06-04 DIAGNOSIS — R413 Other amnesia: Secondary | ICD-10-CM

## 2014-06-04 DIAGNOSIS — R404 Transient alteration of awareness: Secondary | ICD-10-CM

## 2014-06-11 NOTE — Procedures (Signed)
ELECTROENCEPHALOGRAM REPORT  Date of Study: 06/04/2014  Patient's Name: Margaret RamusBetty S Hernandez MRN: [id number] Date of Birth: 1930-02-05  Referring Provider: Dr. Patrcia DollyKaren Keelia Graybill  Clinical History: This is an 81 year old woman with episodes of decreased responsiveness who was witnessed to have a generalized convulsion followed by prolonged altered mental status. Continuous EEG initially read as status epilepticus, however subsequent studies felt to be more supportive of diffuse encephalopathy such as with toxic/metabolic conditions. There was near continuous triphasic activity on prior EEG, that decreased in frequency on day of hospital discharge. This is a follow-up EEG.   Medications: Keppra, Vimpat  Technical Summary: A multichannel digital EEG recording measured by the international 10-20 system with electrodes applied with paste and impedances below 5000 ohms performed as portable with EKG monitoring in an awake and asleep patient.  Hyperventilation was not performed. Photic stimulation was performed.  The digital EEG was referentially recorded, reformatted, and digitally filtered in a variety of bipolar and referential montages for optimal display.   Description: The patient is awake and asleep during the recording.  During maximal wakefulness, there is a symmetric, medium voltage 7 Hz posterior dominant rhythm that poorly attenuates to eye opening and eye closure. This is admixed with occasional diffuse 4-5 Hz theta and 2-3 Hz delta slowing of the waking background, at times with triphasic appearance. There was additional focal 2 Hz delta slowing seen over the right temporal region, at times sharply contoured without clear epileptogenic potential.  During drowsiness and stage I sleep, there is an increase in theta slowing of the background with poorly formed vertex waves seen. Deeper stages of sleep were not seen. Photic stimulation did not elicit any abnormalities.  There were no clear epileptiform  discharges or electrographic seizures seen.    EKG lead was unremarkable.  Impression: This awake and asleep EEG is abnormal due to the presence of: 1. mild to moderate diffuse slowing of the waking background, at times with triphasic appearance 2. Additional focal slowing over the right temporal region  Clinical Correlation of the above findings indicates diffuse cerebral dysfunction that is non-specific in etiology and can be seen with toxic/metabolic encephalopathies, neurodegenerative disorders, or medication effect. Triphasic waves are typically seen with hepatic encephalopathy, but can be seen with other metabolic encephalopathies as well. Focal slowing over the right temporal region indicates focal cerebral dysfunction in this region suggestive of underlying structural or physiologic abnormality.  The absence of epileptiform discharges does not rule out a clinical diagnosis of epilepsy.  Clinical correlation is advised.   Patrcia DollyKaren Mable Lashley, M.D.

## 2014-07-22 ENCOUNTER — Ambulatory Visit (INDEPENDENT_AMBULATORY_CARE_PROVIDER_SITE_OTHER): Payer: Medicare (Managed Care) | Admitting: Neurology

## 2014-07-22 ENCOUNTER — Encounter: Payer: Self-pay | Admitting: Neurology

## 2014-07-22 VITALS — BP 130/70 | HR 68 | Temp 98.4°F | Resp 18 | Ht 65.0 in | Wt 163.0 lb

## 2014-07-22 DIAGNOSIS — R413 Other amnesia: Secondary | ICD-10-CM

## 2014-07-22 DIAGNOSIS — R404 Transient alteration of awareness: Secondary | ICD-10-CM

## 2014-07-22 DIAGNOSIS — R569 Unspecified convulsions: Secondary | ICD-10-CM

## 2014-07-22 NOTE — Progress Notes (Signed)
NEUROLOGY FOLLOW UP OFFICE NOTE  Margaret Hernandez [id number]  HISTORY OF PRESENT ILLNESS: I had the pleasure of seeing Margaret Hernandez in follow-up in the neurology clinic on 07/22/2014.  The patient was last seen 6 months ago for recurrent episodes of decreased responsiveness that would only occur during the weekend. She is again accompanied by her daughter who supplements the history.  She had been scheduled for an ambulatory EEG to further classify these episodes, when on 04/23/14 she had a generalized tonic-clonic seizure. Her daughter heard a scream and thump, and found her mother on the floor with jerking of both arms, foaming at the mouth, with bowel and bladder incontinence. [facility] was interpreted as consistent with generalized status epilepticus not affected by IV Ativan. She underwent video EEG monitoring, and on hospital day 2, EEG had shown mild to moderate diffuse background slowing, with occasional short runs of generalized monomorphic triphasic 1.5-2 Hz frontally predominant activity while awake, mild slowing of the posterior dominant rhythm, noted to be substantially improved from the prior 24 hours. She had EEG monitoring for another 2 days with similar background slowing and occasional triphasic activity suggestive of metabolic encephalopathy. She had initially been put on Keppra, Vimpat, and Depakote, then discharged home on Keppra 1000mg  BID and Vimpat 150mg  BID. She also underwent evaluation for the episodes of lethargy that occur on weekends, normal ammonia, folate, RPR, B12, UDS. She was started on Modafinil.  Since hospital discharge, she was briefly at a SNF and now back at home with her daughter. Her daughter reports that "she won't listen to me, she only listens to my brother." She needs to be told the same thing repeatedly, then would get mean. She could not recall her children's birthdays. No further seizures or episodes of lethargy. No falls, she ambulates with  a walker but sometimes does not use it. Her balance is not good. She denies any headaches, dizziness, diplopia, dysarthria, dysphagia, neck/back pain, focal numbness/tingling/weakness. A follow-up EEG done after hospital discharge showed mild to moderate diffuse background slowing, at times with triphasic appearance, occasional focal slowing over the right temporal region. No epileptiform discharges seen.  PAST MEDICAL HISTORY: Past Medical History  Diagnosis Date  . Diabetes mellitus without complication   . Hypertension   . Dementia     MEDICATIONS: Current Outpatient Prescriptions on File Prior to Visit  Medication Sig Dispense Refill  . amLODipine (NORVASC) 5 MG tablet Take 5 mg by mouth daily.    Marland Kitchen. atorvastatin (LIPITOR) 80 MG tablet Take 80 mg by mouth daily.    . Cholecalciferol (VITAMIN D3) 50000 UNITS CAPS Take 1 capsule by mouth once a week. Once weekly for 13 weeks    . clopidogrel (PLAVIX) 75 MG tablet Take 75 mg by mouth daily with breakfast.    . donepezil (ARICEPT) 10 MG tablet Take 10 mg by mouth at bedtime.    . hydrocortisone cream 1 % Apply 1 application topically 2 (two) times daily as needed for itching (rash on arms).    Marland Kitchen. lacosamide 150 MG TABS Take 1 tablet (150 mg total) by mouth 2 (two) times daily.    Marland Kitchen. levETIRAcetam (KEPPRA) 1000 MG tablet Take 1 tablet (1,000 mg total) by mouth 2 (two) times daily.    Marland Kitchen. lisinopril (PRINIVIL,ZESTRIL) 10 MG tablet Take 10 mg by mouth daily.    . modafinil (PROVIGIL) 200 MG tablet Take 1 tablet (200 mg total) by mouth daily.  No current facility-administered medications on file prior to visit.    ALLERGIES: No Known Allergies  FAMILY HISTORY: Family History  Problem Relation Age of Onset  . Hypertension Mother   . Hypertension Father     SOCIAL HISTORY: History   Social History  . Marital Status: Single    Spouse Name: N/A  . Number of Children: N/A  . Years of Education: N/A   Occupational History  . Not on  file.   Social History Main Topics  . Smoking status: Never Smoker   . Smokeless tobacco: Not on file  . Alcohol Use: No  . Drug Use: No  . Sexual Activity: No   Other Topics Concern  . Not on file   Social History Narrative    REVIEW OF SYSTEMS: Constitutional: No fevers, chills, or sweats, no generalized fatigue, change in appetite Eyes: No visual changes, double vision, eye pain Ear, nose and throat: No hearing loss, ear pain, nasal congestion, sore throat Cardiovascular: No chest pain, palpitations Respiratory:  No shortness of breath at rest or with exertion, wheezes GastrointestinaI: No nausea, vomiting, diarrhea, abdominal pain, fecal incontinence Genitourinary:  No dysuria, urinary retention or frequency Musculoskeletal:  No neck pain, back pain Integumentary: No rash, pruritus, skin lesions Neurological: as above Psychiatric: No depression, insomnia, anxiety Endocrine: No palpitations, fatigue, diaphoresis, mood swings, change in appetite, change in weight, increased thirst Hematologic/Lymphatic:  No anemia, purpura, petechiae. Allergic/Immunologic: no itchy/runny eyes, nasal congestion, recent allergic reactions, rashes  PHYSICAL EXAM: Filed Vitals:   07/22/14 1021  BP: 130/70  Pulse: 68  Temp: 98.4 F (36.9 C)  Resp: 18   General: No acute distress Head:  Normocephalic/atraumatic Neck: supple, no paraspinal tenderness, full range of motion Heart:  Regular rate and rhythm Lungs:  Clear to auscultation bilaterally Back: No paraspinal tenderness Skin/Extremities: No rash, no edema Neurological Exam: alert and oriented to person, place, and time. No aphasia or dysarthria. Fund of knowledge is appropriate.  Recent and remote memory are intact.  Attention and concentration are normal.    Able to name objects and repeat phrases.  MMSE - Mini Mental State Exam 07/22/2014  Orientation to time 5  Orientation to Place 5  Registration 3  Attention/ Calculation 5    Recall 2  Language- name 2 objects 2  Language- repeat 1  Language- follow 3 step command 3  Language- read & follow direction 1  Write a sentence 1  Copy design 0  Total score 28   Cranial nerves: Pupils equal, round, reactive to light.  Fundoscopic exam unremarkable, no papilledema. Extraocular movements intact with no nystagmus. Visual fields full. Facial sensation intact. No facial asymmetry. Tongue, uvula, palate midline.  Motor: Bulk and tone normal, muscle strength 5/5 throughout with no pronator drift, except for restricted left shoulder abduction due to pain.  Sensation to light touch, temperature and vibration intact.  No extinction to double simultaneous stimulation.  Deep tendon reflexes 1+ throughout, toes downgoing.  Finger to nose testing intact.  Gait narrow-based and steady, able to tandem walk adequately.  Romberg negative.  IMPRESSION: This is an 81 yo RH woman with hypertension and diabetes, who had presented with episodes of altered mental status that curiously occurred only during the weekends since 2014. She had a witnessed generalized convulsion last 04/23/14 with EEG showing generalized sharp waves, some with triphasic appearance. Prolonged EEG after the clinical seizure continued to show diffuse slowing with triphasic waves, suggestive of metabolic encephalopathy. She is now on Keppra   BID and Vimpat  BID with no further seizures or episodes of lethargy. She had been started on Provigil as well for the lethargic spells. Follow-up EEG again showed diffuse slowing with triphasic-like waves, no epileptiform discharges. Discussed continuation of Keppra and Vimpat for another month, if no further seizures, we will plan to taper down Vimpat next month. We discussed gait safety and encouraged her to use the walker at all times. Her MMSE today is normal at 28/30, however her daughter relates symptoms suggestive of mild cognitive impairment, continue to monitor, continue  Aricept. She will follow-up in 1 month.  Thank you for allowing me to participate in her care.  Please do not hesitate to call for any questions or concerns.  The duration of this appointment visit was 25 minutes of face-to-face time with the patient.  Greater than 50% of this time was spent in counseling, explanation of diagnosis, planning of further management, and coordination of care.   Patrcia Dolly, M.D.   CC: Dr. Thana Ates

## 2014-07-22 NOTE — Patient Instructions (Signed)
1. Continue current doses of Vimpat and Keppra for now. If you continue to do well, we will plan to taper down Vimpat next month 2. Use your walker at all times, continue with therapy 3. Follow-up in 1 month

## 2014-07-25 NOTE — Progress Notes (Signed)
Done

## 2014-08-28 ENCOUNTER — Encounter: Payer: Self-pay | Admitting: Neurology

## 2014-08-28 ENCOUNTER — Ambulatory Visit (INDEPENDENT_AMBULATORY_CARE_PROVIDER_SITE_OTHER): Payer: Medicare (Managed Care) | Admitting: Neurology

## 2014-08-28 VITALS — BP 124/90 | HR 84 | Ht 66.0 in | Wt 166.0 lb

## 2014-08-28 DIAGNOSIS — R404 Transient alteration of awareness: Secondary | ICD-10-CM

## 2014-08-28 DIAGNOSIS — G40309 Generalized idiopathic epilepsy and epileptic syndromes, not intractable, without status epilepticus: Secondary | ICD-10-CM

## 2014-08-28 DIAGNOSIS — G40909 Epilepsy, unspecified, not intractable, without status epilepticus: Secondary | ICD-10-CM | POA: Diagnosis not present

## 2014-08-28 NOTE — Patient Instructions (Signed)
1. Continue Keppra 1000mg  twice a day and Vimpat 150mg  twice a day 2. Follow-up in 6 months, call our office for any changes

## 2014-08-28 NOTE — Progress Notes (Signed)
NEUROLOGY FOLLOW UP OFFICE NOTE  ALDORA PERMAN [id number]  HISTORY OF PRESENT ILLNESS: I had the pleasure of seeing Margaret Hernandez in follow-up in the neurology clinic on 08/28/2014.  The patient was last seen a month ago after recent hospitalization for new onset witnessed convulsion on 04/23/14. She had been initially seen for recurrent episodes of decreased responsiveness that would only occur during the weekends. She is again accompanied by her daughter who supplements the history.She had been scheduled for an ambulatory EEG to further classify these episodes, when on 04/23/14 she had a generalized tonic-clonic seizure. Her daughter heard a scream and thump, and found her mother on the floor with jerking of both arms, foaming at the mouth, with bowel and bladder incontinence. Her EEG on arrival to [facility] was interpreted as consistent with generalized status epilepticus not affected by IV Ativan. She underwent video EEG monitoring, and on hospital day 2, EEG had shown mild to moderate diffuse background slowing, with occasional short runs of generalized monomorphic triphasic 1.5-2 Hz frontally predominant activity while awake, mild slowing of the posterior dominant rhythm, noted to be substantially improved from the prior 24 hours. She had EEG monitoring for another 2 days with similar background slowing and occasional triphasic activity suggestive of metabolic encephalopathy. She had initially been put on Keppra, Vimpat, and Depakote, then discharged home on Keppra  BID and Vimpat  BID. A follow-up EEG done after hospital discharge showed mild to moderate diffuse background slowing, at times with triphasic appearance, occasional focal slowing over the right temporal region. No epileptiform discharges seen. She also underwent evaluation for the episodes of lethargy that occur on weekends, normal ammonia, folate, RPR, B12, UDS. She was started on Modafinil.  Since hospital discharge, she has  been doing well. Her daughter denies any further episodes of seizures or lethargy. She denies any falls, she ambulates with a walker. She denies any headaches, dizziness, diplopia, dysarthria, dysphagia, neck/back pain, focal numbness/tingling/weakness. She brings her medication bottles today and asks why she has 2 bottles of Lisinopril, one for /day, another for /day. She feels her memory is better. She does not cook or drive, her daughter stays with her 24/7. No difficulties with ADLs.   PAST MEDICAL HISTORY: Past Medical History  Diagnosis Date  . Diabetes mellitus without complication   . Hypertension   . Dementia     MEDICATIONS: Current Outpatient Prescriptions on File Prior to Visit  Medication Sig Dispense Refill  . amLODipine (NORVASC) 5 MG tablet Take 5 mg by mouth daily.    Marland Kitchen atorvastatin (LIPITOR) 80 MG tablet Take 80 mg by mouth daily.    . Cholecalciferol (VITAMIN D3) 50000 UNITS CAPS Take 1 capsule by mouth once a week. Once weekly for 13 weeks    . clopidogrel (PLAVIX) 75 MG tablet Take 75 mg by mouth daily with breakfast.    . donepezil (ARICEPT) 10 MG tablet Take 10 mg by mouth at bedtime.    . levETIRAcetam (KEPPRA) 1000 MG tablet Take 1 tablet (1,000 mg total) by mouth 2 (two) times daily.    Marland Kitchen lisinopril (PRINIVIL,ZESTRIL) 10 MG tablet Take 30 mg by mouth daily.     . modafinil (PROVIGIL) 100 MG tablet Take 100 mg by mouth every morning.  0   No current facility-administered medications on file prior to visit.    ALLERGIES: No Known Allergies  FAMILY HISTORY: Family History  Problem Relation Age of Onset  . Hypertension Mother   . Hypertension Father  SOCIAL HISTORY: History   Social History  . Marital Status: Single    Spouse Name: N/A  . Number of Children: N/A  . Years of Education: N/A   Occupational History  . Not on file.   Social History Main Topics  . Smoking status: Never Smoker   . Smokeless tobacco: Not on file  . Alcohol  Use: No  . Drug Use: No  . Sexual Activity: No   Other Topics Concern  . Not on file   Social History Narrative    REVIEW OF SYSTEMS: Constitutional: No fevers, chills, or sweats, no generalized fatigue, change in appetite Eyes: No visual changes, double vision, eye pain Ear, nose and throat: No hearing loss, ear pain, nasal congestion, sore throat Cardiovascular: No chest pain, palpitations Respiratory:  No shortness of breath at rest or with exertion, wheezes GastrointestinaI: No nausea, vomiting, diarrhea, abdominal pain, fecal incontinence Genitourinary:  No dysuria, urinary retention or frequency Musculoskeletal:  No neck pain, back pain Integumentary: No rash, pruritus, skin lesions Neurological: as above Psychiatric: No depression, insomnia, anxiety Endocrine: No palpitations, fatigue, diaphoresis, mood swings, change in appetite, change in weight, increased thirst Hematologic/Lymphatic:  No anemia, purpura, petechiae. Allergic/Immunologic: no itchy/runny eyes, nasal congestion, recent allergic reactions, rashes  PHYSICAL EXAM: Filed Vitals:   08/28/14 0811  BP: 124/90  Pulse: 84   General: No acute distress Head:  Normocephalic/atraumatic Neck: supple, no paraspinal tenderness, full range of motion Heart:  Regular rate and rhythm Lungs:  Clear to auscultation bilaterally Back: No paraspinal tenderness Skin/Extremities: No rash, no edema Neurological Exam: alert and oriented to person, place, and time. No aphasia or dysarthria. Fund of knowledge is appropriate.  Recent and remote memory are intact. 2/3 delayed recall.  Attention and concentration are normal.    Able to name objects and repeat phrases. Cranial nerves: Pupils equal, round, reactive to light.  Fundoscopic exam unremarkable, no papilledema. Extraocular movements intact with no nystagmus. Visual fields full. Facial sensation intact. No facial asymmetry. Tongue, uvula, palate midline.  Motor: Bulk and tone  normal, muscle strength 5/5 throughout with no pronator drift.  Sensation to light touch, temperature and vibration intact.  No extinction to double simultaneous stimulation.  Deep tendon reflexes 2+ throughout, toes downgoing.  Finger to nose testing intact.  Gait narrow-based and steady, able to tandem walk adequately.  Romberg negative.  IMPRESSION: This is an 81 yo RH woman with hypertension and diabetes, who had presented with episodes of altered mental status that curiously occurred only during the weekends since 2014. She had a witnessed generalized convulsion last 04/23/14 with EEG showing generalized sharp waves, some with triphasic appearance. Prolonged EEG after the clinical seizure continued to show diffuse slowing with triphasic waves, suggestive of metabolic encephalopathy. She is now on Keppra 1000mg  BID and Vimpat 150mg  BID with no further seizures or episodes of lethargy. She had been started on Provigil as well for the lethargic spells. Follow-up EEG again showed diffuse slowing with triphasic-like waves, no epileptiform discharges. We discussed streamlining her medications and slowly tapering off Vimpat, however the patient and her daughter are very scared of having another seizure and do not want to do this at this time. Continue to monitor symptoms on Keppra and Vimpat, side effects were discussed. We will re-evaluate in 6 months. They know to call our office for any changes. Gait safety and use of walker at all times was discussed. They will discuss Lisinopril issue with [facility]. She will follow-up in 6 months or  earlier if needed.  Thank you for allowing me to participate in her care.  Please do not hesitate to call for any questions or concerns.  The duration of this appointment visit was 15 minutes of face-to-face time with the patient.  Greater than 50% of this time was spent in counseling, explanation of diagnosis, planning of further management, and coordination of care.   Patrcia Dolly, M.D.   CC: Dr. Thana Ates

## 2014-09-17 NOTE — Op Note (Signed)
PATIENT NAME:  Margaret Hernandez, Margaret Hernandez MR#:  [id number] DATE OF BIRTH:  03/16/1930  DATE OF PROCEDURE:  05/10/2012  PREOPERATIVE DIAGNOSES:  1. Large tear of left rotator cuff including supraspinatus and infraspinatus.  2. Severe impingement syndrome, left shoulder.  3. Severe arthritis, left acromioclavicular joint. 4. Moderate fraying of the labrum.   POSTOPERATIVE DIAGNOSES:  1. Large tear of left rotator cuff including supraspinatus and infraspinatus.  2. Severe impingement syndrome, left shoulder.  3. Severe arthritis, left acromioclavicular joint. 4. Moderate fraying of the labrum.   PROCEDURES PERFORMED:  1. Arthroscopic left rotator cuff repair.  2. Arthroscopic left distal clavicle excision.  3. Arthroscopic subacromial decompression with partial acromioplasty. 4. Minimal debridement of glenoid labrum.   SURGEON: Margaret Hernandez, M.D.   ANESTHESIA: General endotracheal plus interscalene block.   COMPLICATIONS: None.   DRAINS: None.   ESTIMATED BLOOD LOSS: Minimal.   REPLACEMENTS: None.   OPERATIVE FINDINGS: The patient had a very large tear of the rotator cuff including the supraspinatus and infraspinatus. This extended from the biceps posteriorly to the teres minor. It was retracted somewhat, but was able to be brought back to the tuberosity. There was significant prominence of the anterior acromion and there was significant spurring on the undersurface of the [facility] joint. The glenohumeral joint showed the biceps to be basically intact with minimal fraying. The glenohumeral surfaces were intact. The labrum was slightly frayed. There was mild synovitis.   DESCRIPTION OF PROCEDURE: The patient was brought to the Operating Room where she underwent satisfactory general endotracheal anesthesia, after a left interscalene block had been instilled. She was turned in the right lateral decubitus position and padded appropriately, on the beanbag. The left shoulder was prepped and draped in  sterile fashion and was placed in 10 pounds of traction. Arthroscopy was carried out through the posterior portal with working portals made laterally and anteriorly. The above findings were encountered upon entering the joint and the bursa. The joint was examined and through the lateral portal the motorized shaver could be introduced to debride the labrum and synovium. The cuff was examined from the undersurface and then from above. The supraspinatus was pulled off toward to the anterior. The infraspinatus was also torn. It was somewhat U-shaped. The shaver was used to debride the frayed portions of the cuff. The ArthroCare wand and the shaver were used to identify the dorsal and volar surfaces of the cuff from anteriorly all the way posteriorly. Once this had been completed, the footprint on the trochanter was debrided free of soft tissue and a bur used to touch up the bone to provide a good contact area. The arthroscopic bur was then used to do partial acromioplasty from the posterior approach. The distal clavicle was also excised in its entirety through an anterior portal. At this point, three Magnum wires were introduced into the apex of the tear starting medially and progressing laterally. Each were tied bringing the anterior and posterior portions of the cuff closer together. Another suture was put in place, but not tied. Two stab wounds were then made just off the edge of the acromion to allow for introduction of the speed screw at a good angle. The tap was introduced and a hole made anteriorly and posteriorly to accept the speed lock screws. Prior to this, the Perfect passer was then used to place two well-spaced sutures in the remaining cuff that had been brought back to bone. The anterior 6.5 speed lock anchor was put in place and  sutures brought out and tightened. The traction was decreased to 5 pounds. This brought the front half of the cuff nicely down to bone. The posterior 6.5 speed lock anchor was  introduced and these sutures brought up and tightened bringing the rest of the cuff securely to the tuberosity. A probe showed that the cuff could not be lifted up off the bone. The remaining free suture was then tied over the top to make sure there was good apposition anterior to posterior. Once this was completed, the area was thoroughly irrigated. The repair appeared to be extremely stable and instruments were removed. Stab wounds were closed with 3-0 nylon suture. 0.5% Marcaine with epinephrine and morphine was placed in the joint and the bursa. A dry sterile       dressing was applied along with TENS pads. Traction was removed and the patient was placed in a large sling. She was awakened and taken to recovery in good condition.  ____________________________ Margaret HoarHoward E. Aamira Bischoff, MD hem:slb D: 05/10/2012 11:26:53 ET T: 05/10/2012 11:39:25 ET JOB#: [id number]  cc: Margaret HoarHoward E. Kiyara Bouffard, MD, <Dictator> Margaret HoarHOWARD E Levone Otten MD ELECTRONICALLY SIGNED 05/10/2012 14:13

## 2014-09-20 NOTE — Discharge Summary (Signed)
PATIENT NAME:  Margaret Hernandez, Margaret Hernandez MR#:  [id number] DATE OF BIRTH:  08-06-1929  DATE OF ADMISSION:  02/25/2013 DATE OF DISCHARGE:  02/27/2013  PRIMARY CARE PHYSICIAN: Dr. Thana Ates.    CHIEF COMPLAINT: Confusion.   DISCHARGE DIAGNOSES: 1.  Acute metabolic encephalopathy in the setting of urinary tract infection.  2.  Acute renal failure, resolved.  3.  Underlying dementia.  4.  Accelerated malignant hypertension.  5.  Hypokalemia.  6.  Hypomagnesemia.  7.  Diabetes.   DISCHARGE MEDICATIONS:  Cipro 250 mg every 12 hours for 4 days. Glimepiride 2 mg daily. Amlodipine 5 mg daily, omeprazole 40 mg once a day, meloxicam 15 mg daily, gabapentin 400 mg 2 times a day, clopidogrel 75 mg daily, chlorthalidone 25 mg daily, alendronate 70 mg once a week, aspirin 81 mg daily.   DIET: Low sodium, ADA diet.   ACTIVITY: As tolerated.   DISCHARGE INSTRUCTIONS: Please follow with PCP within 1 to 2 weeks.   SIGNIFICANT LABS AND IMAGING: Initial BUN of 23, creatinine of 1.19 with GFR of about 50. Initial sodium 3.5, potassium 3.2. LFTs on arrival were within normal limits. Hemoglobin A1c is 7.1. Troponin negative. TSH was 1.19. WBC 5.4, hemoglobin 11.2, platelets 199. Blood cultures taken on day of admission were no growth to date. Urine cultures taken on the day of admission show Klebsiella pneumonia, 1 shows 50,000 CFU, another shows 40,000 CFU, which was susceptible to cefazolin, ceftriaxone and Cipro. UA on day of admission positive for nitrites, as well as trace leukocyte esterase, 50 WBCs, 3+ bacteria. CT of head without contrast on the day of admission: Normal for age and x-ray of the chest, one view, shows no significant interval change since previous study from 2011.  HISTORY OF PRESENT ILLNESS AND HOSPITAL COURSE:  For full details of H and P, please see the dictation on 09/28 by Dr. Jacques Navy, but briefly this is a pleasant 81 year old female with history of some dementia, diabetes, hypertension who lives  by herself, came in for confusion and some hallucinations, was found half naked by neighbors who called daughter-in- law. She was brought into the hospital and was thought to have significant elevation of blood pressure and also dirty urine and was admitted to the hospitalist service. She had a CAT scan of the head which was negative for acute stroke, was neurologically intact. Blood cultures were sent as well as urine cultures and she was started on ceftriaxone and some blood pressure adjustments. The patient did have some renal failure based on the lower GFR than her previous baseline and was also started on some fluids. She improved over the next couple of days. Urine cultures are back showing Klebsiella and she will be discharged on Cipro. She will be discharged on 4 days of that and she has had 3 days of ceftriaxone. At this point, she appears to be back to normal. Blood pressures are better as well. She was seen by physical therapy who recommended home with 24-hour assist. Her daughter has accepted to take her into her home and she will be living with the daughter. At this point, she will be discharged with outpatient follow-up.  PHYSICAL EXAMINATION:  VITAL SIGNS: On the day of discharge, temperature is 97.3, pulse rate 76, respiratory rate 17, blood pressure 119/68, O2 sat is 98% on room air.  GENERAL: The patient is awake, alert, oriented x 3, lying in bed in no obvious distress.  HEENT: Normocephalic, atraumatic.  HEART:  Auscultation of the heart sounds show  a regular rhythm, not tachycardic.  LUNGS: Clear.  ABDOMEN: Soft, nontender.  EXTREMITIES: Show no significant lower extremity edema.   The patient IS DO NOT RESUSCITATE, per her family's wishes.   Total Time Spent: 35 minutes.     ____________________________ Krystal EatonShayiq Emiel Kielty, MD sa:dp D: 02/27/2013 14:26:25 ET T: 02/27/2013 14:39:12 ET JOB#: [id number]  cc: Krystal EatonShayiq Zaylen Susman, MD, <Dictator> Dennison MascotLemont Morrisey, MD Krystal EatonSHAYIQ Leniya Breit  MD ELECTRONICALLY SIGNED 03/15/2013 14:11

## 2014-09-20 NOTE — H&P (Signed)
PATIENT NAME:  Margaret Hernandez, Margaret Hernandez MR#:  [id number] DATE OF BIRTH:  [birth year]  DATE OF ADMISSION:  02/25/2013  PRIMARY CARE PHYSICIAN: Dr. Thana Ates.   REFERRING PHYSICIAN: Dr. Fanny Bien.  CHIEF COMPLAINT: Brought in for confusion.  HISTORY OF PRESENT ILLNESS:  The patient is an 81 year old African American female with history of dementia, diabetes, hypertension that lives by herself. Per family, who is here, including a son and daughter who live close by, the patient has been having dementia for the past year or so. She was last seen about 6-7 days ago. It seems she was having confusion and not being herself for the last 6-7 days or so, and this morning cops were called after the neighbors found her to be wandering outside half naked and possibly having some hallucinations. She was brought in here where she was found to have accelerated malignant hypertension with blood pressures in excess of 200 and also dirty UA. Although she denies to me,  initially she had told the ER staff that she had voided a small amount and had some dysuria, without hematuria. She has been given some Cipro. Hospitalist services were contacted for further evaluation and management. The patient is unable to provide any history secondary to confusion. She knows she is in the hospital but otherwise denies any complaints and cannot provide any reliable history All of the kids are here, but they cannot also tell me much.   PAST MEDICAL HISTORY: Diabetes, hypertension, and history of dementia.   PAST SURGICAL HISTORY: Per chart: Cholecystectomy, total abdominal hysterectomy.   ALLERGIES: HYDROCODONE.   OUTPATIENT MEDICATIONS: The medications have not been reconciled yet, but the family has a bag, but there is only three medications in it. I suspect she is taking more, but the medications are amlodipine 5 mg, there is two bottles of this. Omeprazole 40 mg daily and also Mobic 7.5 mg daily, I suspect she is taking a couple of diabetes  medications, donepezil and antiplatelet agents.   REVIEW OF SYSTEMS: Unable to obtain as the patient has significant confusion.   SOCIAL HISTORY: Lives by herself. Had no history of smoking or drug use. Was a heavy drinker, but quit about 25 years ago. Has two children.   FAMILY HISTORY: Daughter with diabetes. Mom with hypertension, as well as the son with hypertension.   PHYSICAL EXAMINATION: VITAL SIGNS: Temperature on arrival 98.1, pulse rate 94, respiratory rate 20, blood pressure 211/94, oxygen saturation 100% on room air. Last blood pressure is 184/91.  GENERAL: The patient is a well-developed no obvious distress. HEENT: Normocephalic, atraumatic. Pupils are equal and reactive. Nonicteric sclera, extraocular muscles intact.  Somewhat dry mucous membranes.  NECK: Supple. No thyroid tenderness. No cervical lymphadenopathy.  CARDIOVASCULAR: S1,  S2 regularly irregular. No significant murmurs appreciated.  LUNGS: Clear to auscultation without wheezing, rhonchi or rales.  ABDOMEN: Soft, nontender, nondistended. Positive bowel sounds in all quadrants.  EXTREMITIES: No significant lower extremity edema.  NEUROLOGIC: Cranial nerves II-XII grossly intact. Strength is 5/5 in all extremities. Sensation appears to be intact to light touch.  PSYCHIATRIC: Awake, alert, oriented x1. She follows directions and knows she is in the hospital but otherwise not oriented.  SKIN: No obvious rashes.   LABORATORIES: Glucose 150, BUN 23, creatinine 1.19, sodium 135, potassium 3.2. LFTs within normal limits, GFR of 50, troponin negative. CK total is slightly high at 251. WBC 5.4, hemoglobin 11.2, platelets are 199. UA has 1+ blood, 30 protein, positive nitrites, as well as trace leukocyte esterase,  5 RBCs, 50 WBCs, 3+ bacteria.   EKG: Normal sinus rhythm, rate is 84, nonspecific T wave changes. No acute ST elevations or depressions.   CAT scan of head without contrast normal noncontrast CT scan of the brain for  age.   X-ray of the chest done, result pending.   ASSESSMENT AND PLAN: We have an 81 year old female with history of dementia for the past year or so, diabetes, hypertension, brought in for altered mental status. The patient has likely metabolic encephalopathy secondary to urinary tract infection as well as the possibility of malignant accelerated hypertension with pressors on arrival greater than 200. I do not know if she is really taking her blood pressure medications or diabetes medications at this point. The patient also appears to have an element of renal failure, which appears to be acute. She appears to be stage III renal failure. At this point, admit the patient the hospital.   For her metabolic encephalopathy, we will treat the urinary tract infection. Would pan culture her and she has been given Cipro, which I will change to ceftriaxone for susceptibility reasons. The patient does have a dirty urine, we will followup the cultures and change the antibiotics accordingly. She is neurologically intact. Would also admit her with a remote tele to look for any significant arrhythmias. Furthermore we will bring down the blood pressure by resuming her amlodipine but increasing the dose to 10 mg and holding the lisinopril at this point and starting the patient on some IV fluids for dehydration and acute renal failure. I would also add hydralazine IV p.r.n. if systolic is greater than 180. I would continue her donepezil, which I think she is on as well as a baby aspirin. She has hypokalemia, which I would replete. Would start her on heparin for deep vein thrombosis prophylaxis. Hold her diabetes medications, check her hemoglobin A1c and start her on sliding scale insulin. I will start her on Lovenox for deep vein thrombosis prophylaxis. The patient is DO NOT RESUSCITATE, per her children's wishes. Would also obtain a physical therapy consult.   Total Time Spent: 50 minutes.     ____________________________ Krystal EatonShayiq Sahir Tolson, MD sa:sg D: 02/25/2013 08:57:00 ET T: 02/25/2013 09:26:51 ET JOB#: [id number]  cc: Krystal EatonShayiq Jonnae Fonseca, MD, <Dictator> Krystal EatonSHAYIQ Ad Guttman MD ELECTRONICALLY SIGNED 03/15/2013 14:11

## 2014-09-20 NOTE — Op Note (Signed)
PATIENT NAME:  Margaret Hernandez, Margaret Hernandez MR#:  [id number] DATE OF BIRTH:  [birth year]  DATE OF PROCEDURE:  01/22/2013  PREOPERATIVE DIAGNOSES 1.  Peripheral arterial disease with claudication, right lower extremity.  2.  Hypertension.  3.  Diabetes.  4.  Hyperlipidemia.   POSTOPERATIVE DIAGNOSES 1.  Peripheral arterial disease with claudication, right lower extremity.  2.  Hypertension.  3.  Diabetes.  4.  Hyperlipidemia.   PROCEDURES PERFORMED 1.  Ultrasound guidance for vascular access, left femoral artery.  2.  Catheter placement to right popliteal artery from left femoral approach.  3.  Aortogram and selective right lower extremity angiogram.  4.  Percutaneous transluminal angioplasty of long segment right SFA stenosis with a 5 mm diameter angioplasty balloon.  5.  Self-expanding stent placement to right superficial femoral artery for greater than 50% residual stenosis and dissection after angioplasty.  6.  StarClose closure right-to-left femoral artery.   SURGEON: Annice NeedyJason Hernandez. Dew, M.D.   ANESTHESIA: Local with moderate conscious sedation.   ESTIMATED BLOOD LOSS: 25 mL.   INDICATION FOR PROCEDURE: This is an 81 year old white female with peripheral arterial disease. She has progression of her right leg symptoms and progression of disease on noninvasive studies. She is brought in for angiography with possible treatment. Risks and benefits were discussed. Informed consent was obtained.   DESCRIPTION OF PROCEDURE: The patient is brought to the vascular  radiology suite. The groins were sterilely prepped and draped and a sterile surgical field was created. Ultrasound was used to visualize a patent left femoral artery. This then accessed under direct ultrasound guidance without difficulty with a Seldinger needle. A J-wire and 5 French sheath were placed. Pigtail catheter was placed in the aorta at the L1-L2 level. AP aortogram was performed. This showed small renal arteries without a dominant  stenosis. The aorta was diffusely calcific but not stenotic. The right iliac stent that had been previously placed was patent and there was no flow-limiting stenosis in the aortoiliac segments. I then hooked the aortic bifurcation and advanced to the right femoral head and selective right lower extremity angiogram was then performed. This demonstrated a diffusely diseased right superficial femoral artery with several areas of near occlusive stenosis in the mid to distal segments. The popliteal artery was then patent from [place] canal down and she had 2-vessel runoff distally. The patient was systemically heparinized. A 6-French Ansell sheath was placed over a Terumo advantage wire. The lesion was easily crossed with a Terumo advantage wire and a Kumpe catheter, and intraluminal flow was confirmed in the popliteal artery below the lesion. I then replaced the wire. A 5 mm diameter angioplasty balloon was inflated from just below [place] canal to the proximal superficial femoral artery. Significant were taken which resolved with angioplasty. The midportion of the SFA had some dissection and greater than 50% residual stenosis over about an 8 to 10 cm area. Otherwise, the angioplasty had a good result. To treat this area, I used a 6 mm diameter x 15 cm length self-expanding stent to encompass the residual diseased area. This was postdilated with a 5 mm balloon with an excellent angiographic completion result and patent flow distally. At this point, I elected to terminate the procedure. The sheath was pulled back to the ipsilateral external iliac artery and oblique arteriogram was performed. StarClose closure device was deployed in the usual fashion with excellent hemostatic result. The patient tolerated the procedure well and was taken to the recovery room in stable condition.   ____________________________ Barbara CowerJason  Driscilla Grammes, MD jsd:cs D: 01/22/2013 16:47:00 ET T: 01/22/2013 19:44:08 ET JOB#: [id number]  cc: Annice Needy, MD, <Dictator> Dennison Mascot, MD Annice Needy MD ELECTRONICALLY SIGNED 01/31/2013 16:11

## 2014-09-22 NOTE — Op Note (Signed)
PATIENT NAME:  Margaret Hernandez, Margaret Hernandez MR#:  [id number] DATE OF BIRTH:  09/18/1929  DATE OF PROCEDURE:  07/08/2011  PREOPERATIVE DIAGNOSES:  1. PAD with claudication. 2. Hypertension.  3. Diabetes.  4. Hypercholesterolemia.   POSTOPERATIVE DIAGNOSES: 1. PAD with claudication. 2. Hypertension.  3. Diabetes.  4. Hypercholesterolemia.   PROCEDURES:  1. Ultrasound guidance for vascular access, right femoral artery.  2. Catheter placement into left popliteal artery from right femoral approach.  3. Aortogram and selective left lower extremity angiogram.  4. Percutaneous transluminal angioplasty of bilateral common iliac arteries with 6 mm diameter angioplasty balloon.  5. Percutaneous transluminal angioplasty of long segment left SFA and popliteal occlusion with 5 mm diameter angioplasty balloon.  6. Self-expanding stent placement to mid to distal superficial femoral artery and proximal popliteal artery for greater than 50% residual stenosis and dissection after angioplasty.  7. StarClose closure device, right femoral artery.   SURGEON: Annice NeedyJason S. Kay Shippy, MD   ANESTHESIA: Local with moderate conscious sedation.   ESTIMATED BLOOD LOSS: Approximately 25 mL.   INDICATION FOR PROCEDURE: This is a 81 year old African American female with peripheral arterial disease. She has claudication more so on the left than the right but both lower extremities are affected. She is brought in for evaluation for revascularization with angiogram. Risks and benefits were discussed. Informed consent was obtained.   DESCRIPTION OF PROCEDURE: The patient was brought to the Vascular Interventional Radiology Suite. Groins were shaved and prepped and a sterile surgical field was created. Due to poorly palpable right femoral pulse, ultrasound was used to access the right femoral artery. This was done without difficulty under direct ultrasound guidance with a Seldinger needle and permanent image was recorded. A 5 French sheath was  placed. There was a high-grade stenosis in the right external iliac artery just below the hypogastric artery that I had to use a Kumpe catheter to get across. A pigtail catheter was then placed in the aorta. This demonstrated a very small left renal artery with a poor nephrogram. There was 80 to 90% stenosis in the right external iliac artery and approximately 70% stenosis in the left external iliac artery. She had high origin of the hypogastric artery bilaterally. A Terumo advantage wire was placed and we crossed the aortic bifurcation using a pigtail catheter, advanced the wire to the left femoral head. A 6 mm diameter angioplasty balloon was then taken over the aortic bifurcation, used to inflate the left external artery at the area of moderate stenosis and then pulled back and used to treat the right external iliac artery at the area of high-grade stenosis. Wastes were taken which resolved and completion angiogram pictures showed mild residual stenosis that did not appear flow-limiting. I then put a 6 French Ansel sheath through the Terumo advantage wire. We imaged the left lower extremity. There was a flush occlusion of the left superficial femoral artery that reconstituted just above the level of the knee at the popliteal artery. The peroneal artery was the dominant runoff distally. I was able to cannulate the superficial femoral artery and gain access with the advantage wire and a Kumpe catheter and crossed the lesion without difficulty confirming intraluminal flow in the popliteal artery at the level of the knee. I then replaced the wire. Balloon inflation was performed with a 5 mm diameter angioplasty balloon encompassing the entirety of the superficial femoral artery and above-knee popliteal artery. The completion angiogram showed the proximal superficial femoral artery to be patent but in the mid to distal  superficial femoral artery there was dissection with residual greater than 50% stenosis that tracked  down into the above-knee popliteal artery. This was treated with a 6 mm diameter self-expanding stent ironed out with a 5 mm balloon with an excellent angiographic completion result. At this point, I elected to terminate the procedure. The sheath was pulled back to the ipsilateral external iliac artery and oblique arteriogram was performed. StarClose closure device was deployed in the usual fashion with excellent hemostatic result. The patient tolerated the procedure well and was taken to the recovery room in stable condition.   ____________________________ Annice Needy, MD jsd:drc D: 07/08/2011 11:15:36 ET T: 07/08/2011 11:43:21 ET JOB#: [id number]  cc: Annice Needy, MD, <Dictator>, Dennison Mascot, MD Annice Needy MD ELECTRONICALLY SIGNED 07/19/2011 9:23

## 2014-09-22 NOTE — Op Note (Signed)
PATIENT NAME:  Margaret Hernandez, Margaret Hernandez MR#:  [id number] DATE OF BIRTH:  1929-09-16  DATE OF PROCEDURE:  11/08/2011  PREOPERATIVE DIAGNOSES:  1. Peripheral arterial disease with claudication bilateral lower extremities.  2. Hypertension.  3. Diabetes.   POSTOPERATIVE DIAGNOSES: 1. Peripheral arterial disease with claudication bilateral lower extremities.  2. Hypertension.  3. Diabetes.   PROCEDURES:  1. Ultrasound guidance for vascular access to left femoral artery.  2. Catheter placement into right popliteal artery from left femoral approach.  3. Percutaneous transluminal angioplasty of right popliteal artery with 5 mm diameter angioplasty balloon.  4. Percutaneous transluminal angioplasty of entire SFA and above-knee popliteal artery with 6 mm diameter angioplasty balloon.  5. Percutaneous transluminal angioplasty of proximal SFA with 7 mm diameter angioplasty balloon.  6. Percutaneous transluminal angioplasty of right external iliac artery with 7 mm diameter angioplasty balloon.  7. Balloon expandable stent placement to right external iliac artery with 8 mm diameter self-expanding stent.  8. Percutaneous transluminal angioplasty of left external iliac artery with 8 mm diameter angioplasty balloon.  9. Perclose closure device, left femoral artery.   SURGEON: Annice NeedyJason S. Belkis Norbeck, MD   ANESTHESIA: Local with moderate conscious sedation.   ESTIMATED BLOOD LOSS: 25 mL.   INDICATION FOR PROCEDURE: The patient is a 81 year old African American female with severe peripheral vascular disease. She has markedly reduced perfusion on the right and moderately reduced perfusion on the left. She is brought in for angiography with possible revascularization. Risks and benefits were discussed. Informed consent was obtained.   DESCRIPTION OF PROCEDURE: The patient was brought to the Vascular Interventional Radiology Suite. Groins were shaved and prepped and a sterile surgical field was created. The left femoral  artery was localized with fluoroscopy due to poor anatomic landmarks. Ultrasound was used to access a patent left femoral artery. This was done under direct ultrasound guidance without difficulty with a Seldinger needle and permanent image was recorded. Hernandez-wire and 5 French sheath was placed. Pigtail catheter was placed in the aorta. I pulled down the aortic bifurcation and pelvic oblique was performed. This showed a moderate stenosis just below a high left hypogastric artery and external iliac artery in the 70% range. The right external iliac artery just below a high takeoff of the hypogastric artery was occluded. She then had a flush right SFA occlusion that reconstituted the above-knee popliteal artery distally. The patient was systemically heparinized. A 6 French Ansel sheath was placed over a Terumo advantage wire. I was able to cross the right iliac occlusion without difficulty. I then got into the SFA without difficulty and crossed this lesion with a Kumpe catheter and Terumo advantage wire confirming intraluminal flow in the popliteal artery and then replacing the wire. A 5 mm diameter angioplasty balloon was used at the knee and a 6 mm diameter angioplasty balloon from the above-knee popliteal artery to the groin. There was still some residual waste at the origin of the superficial femoral artery and this was treated with a 7 mm diameter angioplasty balloon. There were several areas of less than 50% stenosis following this so I did not place a stent. I then turned my attention to the iliac artery. This was ballooned with a 7 mm diameter angioplasty balloon with high-grade residual stenosis. An 8 mm diameter x 4 cm length balloon expandable stent was placed and following this it was widely patent. The left iliac lesion was treated with an 8 mm diameter angioplasty balloon alone with only approximately 20% residual stenosis. At this  point, I elected to terminate the procedure. Perclose closure device was  deployed with a suboptimal hemostatic result so pressure was held in the groin for 15 minutes and hemostasis was achieved. The patient tolerated the procedure well and was taken to the recovery room in stable condition.   ____________________________ Annice Needy, MD jsd:drc D: 11/08/2011 14:13:08 ET T: 11/08/2011 14:31:06 ET JOB#: [id number]  cc: Annice Needy, MD, <Dictator> Dennison Mascot, MD Annice Needy MD ELECTRONICALLY SIGNED 11/10/2011 12:28

## 2015-02-28 ENCOUNTER — Ambulatory Visit: Payer: Medicare (Managed Care) | Admitting: Neurology

## 2015-03-11 ENCOUNTER — Encounter: Payer: Self-pay | Admitting: Neurology

## 2015-03-11 ENCOUNTER — Ambulatory Visit (INDEPENDENT_AMBULATORY_CARE_PROVIDER_SITE_OTHER): Payer: Medicare (Managed Care) | Admitting: Neurology

## 2015-03-11 VITALS — BP 110/60 | HR 74 | Resp 16 | Ht 66.0 in | Wt 166.0 lb

## 2015-03-11 DIAGNOSIS — R404 Transient alteration of awareness: Secondary | ICD-10-CM

## 2015-03-11 DIAGNOSIS — G40309 Generalized idiopathic epilepsy and epileptic syndromes, not intractable, without status epilepticus: Secondary | ICD-10-CM | POA: Diagnosis not present

## 2015-03-11 NOTE — Patient Instructions (Signed)
1. Continue all your current medications 2. Use walker at all times 3. Follow-up in 1 year, call for any changes  Seizure Precautions: 1. If medication has been prescribed for you to prevent seizures, take it exactly as directed.  Do not stop taking the medicine without talking to your doctor first, even if you have not had a seizure in a long time.   2. Avoid activities in which a seizure would cause danger to yourself or to others.  Don't operate dangerous machinery, swim alone, or climb in high or dangerous places, such as on ladders, roofs, or girders.  Do not drive unless your doctor says you may.  3. If you have any warning that you may have a seizure, lay down in a safe place where you can't hurt yourself.    4.  No driving for 6 months from last seizure, as per [facility].   Please refer to the following link on the [employer]'s website for more information: http://www.epilepsyfoundation.org/answerplace/Social/driving/drivingu.cfm   5.  Maintain good sleep hygiene.  6.  Contact your doctor if you have any problems that may be related to the medicine you are taking.  7.  Call 911 and bring the patient back to the ED if:        A.  The seizure lasts longer than 5 minutes.       B.  The patient doesn't awaken shortly after the seizure  C.  The patient has new problems such as difficulty seeing, speaking or moving  D.  The patient was injured during the seizure  E.  The patient has a temperature over 102 F (39C)  F.  The patient vomited and now is having trouble breathing

## 2015-03-11 NOTE — Progress Notes (Signed)
NEUROLOGY FOLLOW UP OFFICE NOTE  KEEARA FREES [id number]  HISTORY OF PRESENT ILLNESS: I had the pleasure of seeing Lowella Kindley in follow-up in the neurology clinic on 03/11/2015.  The patient was last seen 7 months ago after she had a witnessed convulsion in November 2015. She had previously been seen for recurrent episodes of decreased responsiveness that would only occur during the weekends. She is again accompanied by her daughter who supplements the history. Since her last visit, she continues to do well with no further episodes of decreased responsiveness or convulsions. Memory is stable. She continues to participate with [facility]. She reports mild left frontal headaches with no associated vision changes, nausea/vomiting/photo/phonophobia. She denies any dizziness, diplopia, focal numbness/tingling/weakness, no falls. She denies any side effects on Keppra  BID and Vimpat  BID and would like to continue all her medications.   HPI: This is a pleasant 81 yo RH woman with a history of hypertension, hyperlipidemia, and dementia, who presented for episodes of altered mental status that interestingly occur only on weekends. 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 She was found to have a UTI at that time and was diagnosed with dementia and started on Aricept. Since then, she has been living with her daughter and stopped working and driving. 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 Since September 2015, things have been going down. She forgets that she gave money away to someone. She needs reminders to take her medication. Since May 2015, she had been having episodes that only occur during the weekend, where she would sleep on and off from Saturday to Monday, refusing to drink, eat,  or bathe. Her daughter reports it is like having a child, "she won't do anything," closing her mouth shut when fed food. She looks at family like she doesn't recognize them. Ms. Whitmyer states she has no recollection of this. Her daughter reports this is a change in her personality. Then starting Tuesday until the weekend, she is back to baseline, with good appetite, awake and cooperative. MRI brain without contrast which did not show any acute changes, there was mild diffuse atrophy and mild chronic microvascular change. Her routine EEG showed mild diffuse slowing and rare focal right temporal slowing, no epileptiform discharges.   On review of ER records, she was brought to the ER in 03/2013 for altered mental status, increasing weakness and confusion. She was reported to be unsteady, and was not eating or drinking. She was noted to have a blunted affect and very slow to respond. There was no UTI at that time. I personally reviewed MRI brain done at that time which did not show any acute changes, with mild bilateral chronic microvascular changes. She was brought back to the ER in 09/2013 for one of the episodes noted above. She had been sleeping for 3 days, very weak, less responsive with only one word answers. She was noted to be listless, disoriented, non-focal exam. CBC and CMP were unremarkable except for BUN 24, creatinine 1.17, ammonia negative. Head CT unremarkable. She was discharged home in improved condition.   She had been scheduled for an ambulatory EEG to further classify these episodes, when on 04/23/14 she had a generalized tonic-clonic seizure. Her daughter heard a scream and thump, and found her  mother on the floor with jerking of both arms, foaming at the mouth, with bowel and bladder incontinence. Her EEG on arrival to [facility] was interpreted as consistent with generalized status epilepticus not affected by IV Ativan. She underwent video EEG monitoring, and on hospital day 2, EEG had shown mild  to moderate diffuse background slowing, with occasional short runs of generalized monomorphic triphasic 1.5-2 Hz frontally predominant activity while awake, mild slowing of the posterior dominant rhythm, noted to be substantially improved from the prior 24 hours. She had EEG monitoring for another 2 days with similar background slowing and occasional triphasic activity suggestive of metabolic encephalopathy. She had initially been put on Keppra, Vimpat, and Depakote, then discharged home on Keppra  BID and Vimpat  BID. A follow-up EEG done after hospital discharge showed mild to moderate diffuse background slowing, at times with triphasic appearance, occasional focal slowing over the right temporal region. No epileptiform discharges seen. She also underwent evaluation for the episodes of lethargy that occur on weekends, normal ammonia, folate, RPR, B12, UDS. She was started on Modafinil.   PAST MEDICAL HISTORY: Past Medical History  Diagnosis Date  . Diabetes mellitus without complication (HCC)   . Hypertension   . Dementia     MEDICATIONS: Current Outpatient Prescriptions on File Prior to Visit  Medication Sig Dispense Refill  . amLODipine (NORVASC) 5 MG tablet Take 5 mg by mouth daily.    Marland Kitchen atorvastatin (LIPITOR) 80 MG tablet Take 80 mg by mouth daily.    . calcium citrate (CALCITRATE - DOSED IN MG ELEMENTAL CALCIUM) 950 MG tablet Take 200 mg of elemental calcium by mouth daily.    . Cholecalciferol (VITAMIN D3) 50000 UNITS CAPS Take 1 capsule by mouth once a week. Once weekly for 13 weeks    . clopidogrel (PLAVIX) 75 MG tablet Take 75 mg by mouth daily with breakfast.    . docusate sodium (COLACE) 100 MG capsule Take 100 mg by mouth 2 (two) times daily.    Marland Kitchen donepezil (ARICEPT) 10 MG tablet Take 10 mg by mouth at bedtime.    . Lacosamide 150 MG TABS Take 1 tablet by mouth 2 (two) times daily.    Marland Kitchen levETIRAcetam (KEPPRA) 1000 MG tablet Take 1 tablet (1,000 mg total) by mouth 2  (two) times daily.    Marland Kitchen lisinopril (PRINIVIL,ZESTRIL) 10 MG tablet Take 30 mg by mouth daily.     . memantine (NAMENDA) 10 MG tablet Take 10 mg by mouth 2 (two) times daily.    . modafinil (PROVIGIL) 100 MG tablet Take 100 mg by mouth every morning.  0   No current facility-administered medications on file prior to visit.    ALLERGIES: No Known Allergies  FAMILY HISTORY: Family History  Problem Relation Age of Onset  . Hypertension Mother   . Hypertension Father     SOCIAL HISTORY: Social History   Social History  . Marital Status: Single    Spouse Name: N/A  . Number of Children: N/A  . Years of Education: N/A   Occupational History  . Not on file.   Social History Main Topics  . Smoking status: Never Smoker   . Smokeless tobacco: Not on file  . Alcohol Use: No  . Drug Use: No  . Sexual Activity: No   Other Topics Concern  . Not on file   Social History Narrative    REVIEW OF SYSTEMS: Constitutional: No fevers, chills, or sweats, no generalized fatigue, change in appetite Eyes: No  visual changes, double vision, eye pain Ear, nose and throat: No hearing loss, ear pain, nasal congestion, sore throat Cardiovascular: No chest pain, palpitations Respiratory:  No shortness of breath at rest or with exertion, wheezes GastrointestinaI: No nausea, vomiting, diarrhea, abdominal pain, fecal incontinence Genitourinary:  No dysuria, urinary retention or frequency Musculoskeletal:  No neck pain, back pain Integumentary: No rash, pruritus, skin lesions Neurological: as above Psychiatric: No depression, insomnia, anxiety Endocrine: No palpitations, fatigue, diaphoresis, mood swings, change in appetite, change in weight, increased thirst Hematologic/Lymphatic:  No anemia, purpura, petechiae. Allergic/Immunologic: no itchy/runny eyes, nasal congestion, recent allergic reactions, rashes  PHYSICAL EXAM: Filed Vitals:   03/11/15 0946  BP: 110/60  Pulse: 74  Resp: 16    General: No acute distress Head:  Normocephalic/atraumatic, no temporal tenderness or ropiness Neck: supple, no paraspinal tenderness, full range of motion Heart:  Regular rate and rhythm Lungs:  Clear to auscultation bilaterally Back: No paraspinal tenderness Skin/Extremities: No rash, no edema Neurological Exam: alert and oriented to person, place, and time. No aphasia or dysarthria. Fund of knowledge is appropriate.  Recent and remote memory are intact.  Attention and concentration are normal.    Able to name objects and repeat phrases. Cranial nerves: Pupils equal, round, reactive to light.  Fundoscopic exam unremarkable, no papilledema. Extraocular movements intact with no nystagmus. Visual fields full. Facial sensation intact. No facial asymmetry. Tongue, uvula, palate midline.  Motor: Bulk and tone normal, muscle strength 5/5 throughout with no pronator drift.  Sensation to light touch intact.  No extinction to double simultaneous stimulation.  Deep tendon reflexes 2+ throughout, toes downgoing.  Finger to nose testing intact.  Gait wide-based slightly unsteady with walker, Romberg negative.  IMPRESSION: This is an 81 yo RH woman with hypertension and diabetes, who had presented with episodes of altered mental status that curiously occurred only during the weekends since 2014. She had a witnessed generalized convulsion last 04/23/14 with EEG showing generalized sharp waves, some with triphasic appearance. Prolonged EEG after the clinical seizure continued to show diffuse slowing with triphasic waves, suggestive of metabolic encephalopathy.  Follow-up outpatient EEG again showed diffuse slowing with triphasic-like waves, no epileptiform discharges.She is now on Keppra 1000mg  BID and Vimpat 150mg  BID with no further seizures or episodes of lethargy for almost a year. She had been started on Provigil as well for the lethargic spells. We had previously discussed streamlining her medications and  slowly tapering off Vimpat, however the patient and her daughter are again very hesitant and scared of having another seizure and do not want to do this at this time. She has mild headaches, neurological exam today normal, may take prn Aleve. She will follow-up in 1 year and knows to call our office for any changes.   Thank you for allowing me to participate in her care.  Please do not hesitate to call for any questions or concerns.  The duration of this appointment visit was 24 minutes of face-to-face time with the patient.  Greater than 50% of this time was spent in counseling, explanation of diagnosis, planning of further management, and coordination of care.   Patrcia Dolly, M.D.   CC: Dr. Thana Ates

## 2015-04-29 ENCOUNTER — Ambulatory Visit: Payer: Medicare (Managed Care) | Admitting: Neurology

## 2015-10-30 ENCOUNTER — Encounter: Payer: Self-pay | Admitting: Family Medicine

## 2016-03-10 ENCOUNTER — Ambulatory Visit: Payer: Medicare (Managed Care) | Admitting: Neurology

## 2016-03-10 DIAGNOSIS — Z029 Encounter for administrative examinations, unspecified: Secondary | ICD-10-CM

## 2016-03-26 ENCOUNTER — Ambulatory Visit: Payer: Medicare (Managed Care) | Admitting: Neurology

## 2016-10-19 ENCOUNTER — Ambulatory Visit
Admission: RE | Admit: 2016-10-19 | Discharge: 2016-10-19 | Disposition: A | Discharge status: Home / Self Care | Payer: Medicare (Managed Care) | Source: Ambulatory Visit | Attending: Nurse Practitioner | Admitting: Nurse Practitioner

## 2016-10-19 ENCOUNTER — Other Ambulatory Visit: Payer: Self-pay | Admitting: Nurse Practitioner

## 2016-10-19 DIAGNOSIS — R05 Cough: Secondary | ICD-10-CM

## 2016-10-19 DIAGNOSIS — R059 Cough, unspecified: Secondary | ICD-10-CM

## 2018-07-18 ENCOUNTER — Ambulatory Visit
Admission: RE | Admit: 2018-07-18 | Discharge: 2018-07-18 | Disposition: A | Discharge status: Home / Self Care | Payer: No Typology Code available for payment source | Source: Ambulatory Visit | Attending: Nurse Practitioner | Admitting: Nurse Practitioner

## 2018-07-18 ENCOUNTER — Other Ambulatory Visit: Payer: Self-pay | Admitting: Nurse Practitioner

## 2018-07-18 DIAGNOSIS — R05 Cough: Secondary | ICD-10-CM

## 2018-07-18 DIAGNOSIS — R053 Chronic cough: Secondary | ICD-10-CM

## 2020-07-01 ENCOUNTER — Ambulatory Visit
Admission: RE | Admit: 2020-07-01 | Discharge: 2020-07-01 | Disposition: A | Discharge status: Home / Self Care | Payer: Medicare (Managed Care) | Source: Ambulatory Visit | Attending: Family Medicine | Admitting: Family Medicine

## 2020-07-01 ENCOUNTER — Other Ambulatory Visit: Payer: Self-pay | Admitting: Family Medicine

## 2020-07-01 DIAGNOSIS — M5489 Other dorsalgia: Secondary | ICD-10-CM

## 2020-07-01 DIAGNOSIS — W19XXXA Unspecified fall, initial encounter: Secondary | ICD-10-CM

## 2020-07-01 DIAGNOSIS — Y92009 Unspecified place in unspecified non-institutional (private) residence as the place of occurrence of the external cause: Secondary | ICD-10-CM

## 2020-07-08 ENCOUNTER — Encounter (HOSPITAL_COMMUNITY): Payer: Self-pay | Admitting: Emergency Medicine

## 2020-07-08 ENCOUNTER — Inpatient Hospital Stay (HOSPITAL_COMMUNITY)
Admission: EM | Admit: 2020-07-08 | Discharge: 2020-07-11 | DRG: 683 | Disposition: A | Discharge status: Skilled Nursing Facility | Payer: Medicare (Managed Care) | Attending: Internal Medicine | Admitting: Internal Medicine

## 2020-07-08 ENCOUNTER — Inpatient Hospital Stay (HOSPITAL_COMMUNITY): Payer: Medicare (Managed Care)

## 2020-07-08 ENCOUNTER — Emergency Department (HOSPITAL_COMMUNITY): Payer: Medicare (Managed Care)

## 2020-07-08 DIAGNOSIS — R531 Weakness: Secondary | ICD-10-CM | POA: Diagnosis present

## 2020-07-08 DIAGNOSIS — R296 Repeated falls: Secondary | ICD-10-CM | POA: Diagnosis present

## 2020-07-08 DIAGNOSIS — M5136 Other intervertebral disc degeneration, lumbar region: Secondary | ICD-10-CM | POA: Diagnosis present

## 2020-07-08 DIAGNOSIS — I10 Essential (primary) hypertension: Secondary | ICD-10-CM | POA: Diagnosis present

## 2020-07-08 DIAGNOSIS — E876 Hypokalemia: Secondary | ICD-10-CM | POA: Diagnosis present

## 2020-07-08 DIAGNOSIS — Z66 Do not resuscitate: Secondary | ICD-10-CM | POA: Diagnosis present

## 2020-07-08 DIAGNOSIS — Z7902 Long term (current) use of antithrombotics/antiplatelets: Secondary | ICD-10-CM | POA: Diagnosis not present

## 2020-07-08 DIAGNOSIS — D509 Iron deficiency anemia, unspecified: Secondary | ICD-10-CM | POA: Diagnosis present

## 2020-07-08 DIAGNOSIS — E860 Dehydration: Secondary | ICD-10-CM | POA: Diagnosis present

## 2020-07-08 DIAGNOSIS — M47816 Spondylosis without myelopathy or radiculopathy, lumbar region: Secondary | ICD-10-CM | POA: Diagnosis present

## 2020-07-08 DIAGNOSIS — M4856XA Collapsed vertebra, not elsewhere classified, lumbar region, initial encounter for fracture: Secondary | ICD-10-CM | POA: Diagnosis present

## 2020-07-08 DIAGNOSIS — Z8249 Family history of ischemic heart disease and other diseases of the circulatory system: Secondary | ICD-10-CM | POA: Diagnosis not present

## 2020-07-08 DIAGNOSIS — M48061 Spinal stenosis, lumbar region without neurogenic claudication: Secondary | ICD-10-CM | POA: Diagnosis present

## 2020-07-08 DIAGNOSIS — Z90710 Acquired absence of both cervix and uterus: Secondary | ICD-10-CM

## 2020-07-08 DIAGNOSIS — R630 Anorexia: Secondary | ICD-10-CM | POA: Diagnosis present

## 2020-07-08 DIAGNOSIS — K5900 Constipation, unspecified: Secondary | ICD-10-CM | POA: Diagnosis present

## 2020-07-08 DIAGNOSIS — R5381 Other malaise: Secondary | ICD-10-CM | POA: Diagnosis not present

## 2020-07-08 DIAGNOSIS — N179 Acute kidney failure, unspecified: Secondary | ICD-10-CM | POA: Diagnosis present

## 2020-07-08 DIAGNOSIS — Z79899 Other long term (current) drug therapy: Secondary | ICD-10-CM | POA: Diagnosis not present

## 2020-07-08 DIAGNOSIS — Z20822 Contact with and (suspected) exposure to covid-19: Secondary | ICD-10-CM | POA: Diagnosis present

## 2020-07-08 DIAGNOSIS — S32029A Unspecified fracture of second lumbar vertebra, initial encounter for closed fracture: Secondary | ICD-10-CM | POA: Diagnosis not present

## 2020-07-08 DIAGNOSIS — E119 Type 2 diabetes mellitus without complications: Secondary | ICD-10-CM | POA: Diagnosis present

## 2020-07-08 DIAGNOSIS — R569 Unspecified convulsions: Secondary | ICD-10-CM

## 2020-07-08 DIAGNOSIS — Y92000 Kitchen of unspecified non-institutional (private) residence as  the place of occurrence of the external cause: Secondary | ICD-10-CM

## 2020-07-08 DIAGNOSIS — Z515 Encounter for palliative care: Secondary | ICD-10-CM | POA: Diagnosis not present

## 2020-07-08 DIAGNOSIS — Z7189 Other specified counseling: Secondary | ICD-10-CM | POA: Diagnosis not present

## 2020-07-08 DIAGNOSIS — G40901 Epilepsy, unspecified, not intractable, with status epilepticus: Secondary | ICD-10-CM | POA: Diagnosis present

## 2020-07-08 DIAGNOSIS — W19XXXA Unspecified fall, initial encounter: Secondary | ICD-10-CM | POA: Diagnosis present

## 2020-07-08 DIAGNOSIS — F0390 Unspecified dementia without behavioral disturbance: Secondary | ICD-10-CM | POA: Diagnosis present

## 2020-07-08 LAB — HEPATIC FUNCTION PANEL
ALT: 11 U/L (ref 0–44)
AST: 20 U/L (ref 15–41)
Albumin: 3.3 g/dL — ABNORMAL LOW (ref 3.5–5.0)
Alkaline Phosphatase: 66 U/L (ref 38–126)
Bilirubin, Direct: 0.3 mg/dL — ABNORMAL HIGH (ref 0.0–0.2)
Indirect Bilirubin: 0.5 mg/dL (ref 0.3–0.9)
Total Bilirubin: 0.8 mg/dL (ref 0.3–1.2)
Total Protein: 6.3 g/dL — ABNORMAL LOW (ref 6.5–8.1)

## 2020-07-08 LAB — IRON AND TIBC
Iron: 24 ug/dL — ABNORMAL LOW (ref 28–170)
Saturation Ratios: 11 % (ref 10.4–31.8)
TIBC: 221 ug/dL — ABNORMAL LOW (ref 250–450)
UIBC: 197 ug/dL

## 2020-07-08 LAB — HEMOGLOBIN A1C
Hgb A1c MFr Bld: 6.4 % — ABNORMAL HIGH (ref 4.8–5.6)
Mean Plasma Glucose: 136.98 mg/dL

## 2020-07-08 LAB — BASIC METABOLIC PANEL
Anion gap: 13 (ref 5–15)
BUN: 23 mg/dL (ref 8–23)
CO2: 28 mmol/L (ref 22–32)
Calcium: 10.2 mg/dL (ref 8.9–10.3)
Chloride: 98 mmol/L (ref 98–111)
Creatinine, Ser: 1.43 mg/dL — ABNORMAL HIGH (ref 0.44–1.00)
GFR, Estimated: 35 mL/min — ABNORMAL LOW (ref >60)
Glucose, Bld: 117 mg/dL — ABNORMAL HIGH (ref 70–99)
Potassium: 3 mmol/L — ABNORMAL LOW (ref 3.5–5.1)
Sodium: 139 mmol/L (ref 135–145)

## 2020-07-08 LAB — TSH: TSH: 1.02 u[IU]/mL (ref 0.350–4.500)

## 2020-07-08 LAB — CBC
HCT: 35 % — ABNORMAL LOW (ref 36.0–46.0)
Hemoglobin: 11 g/dL — ABNORMAL LOW (ref 12.0–15.0)
MCH: 24.9 pg — ABNORMAL LOW (ref 26.0–34.0)
MCHC: 31.4 g/dL (ref 30.0–36.0)
MCV: 79.2 fL — ABNORMAL LOW (ref 80.0–100.0)
Platelets: 241 10*3/uL (ref 150–400)
RBC: 4.42 MIL/uL (ref 3.87–5.11)
RDW: 14 % (ref 11.5–15.5)
WBC: 8.3 10*3/uL (ref 4.0–10.5)
nRBC: 0 % (ref 0.0–0.2)

## 2020-07-08 LAB — URINALYSIS, ROUTINE W REFLEX MICROSCOPIC
Bilirubin Urine: NEGATIVE
Glucose, UA: NEGATIVE mg/dL
Ketones, ur: NEGATIVE mg/dL
Leukocytes,Ua: NEGATIVE
Nitrite: NEGATIVE
Protein, ur: NEGATIVE mg/dL
Specific Gravity, Urine: 1.016 (ref 1.005–1.030)
pH: 5 (ref 5.0–8.0)

## 2020-07-08 LAB — RESP PANEL BY RT-PCR (FLU A&B, COVID) ARPGX2
Influenza A by PCR: NEGATIVE
Influenza B by PCR: NEGATIVE
SARS Coronavirus 2 by RT PCR: NEGATIVE

## 2020-07-08 LAB — AMMONIA: Ammonia: 11 umol/L (ref 9–35)

## 2020-07-08 LAB — FERRITIN: Ferritin: 376 ng/mL — ABNORMAL HIGH (ref 11–307)

## 2020-07-08 LAB — CBG MONITORING, ED: Glucose-Capillary: 106 mg/dL — ABNORMAL HIGH (ref 70–99)

## 2020-07-08 LAB — FOLATE: Folate: 8 ng/mL (ref >5.9)

## 2020-07-08 LAB — VITAMIN B12: Vitamin B-12: 390 pg/mL (ref 180–914)

## 2020-07-08 LAB — MAGNESIUM: Magnesium: 1.6 mg/dL — ABNORMAL LOW (ref 1.7–2.4)

## 2020-07-08 MED ORDER — SENNOSIDES-DOCUSATE SODIUM 8.6-50 MG PO TABS
1.0000 | ORAL_TABLET | Freq: Two times a day (BID) | ORAL | Status: DC
  Administered 2020-07-08 – 2020-07-11 (×6): 1 via ORAL
  Filled 2020-07-08 (×6): qty 1

## 2020-07-08 MED ORDER — POLYETHYLENE GLYCOL 3350 17 G PO PACK
17.0000 g | PACK | Freq: Every day | ORAL | Status: DC
  Administered 2020-07-08 – 2020-07-11 (×4): 17 g via ORAL
  Filled 2020-07-08 (×4): qty 1

## 2020-07-08 MED ORDER — LACOSAMIDE 50 MG PO TABS
150.0000 mg | ORAL_TABLET | Freq: Two times a day (BID) | ORAL | Status: DC
  Administered 2020-07-08 – 2020-07-11 (×6): 150 mg via ORAL
  Filled 2020-07-08 (×7): qty 3

## 2020-07-08 MED ORDER — POTASSIUM CHLORIDE CRYS ER 20 MEQ PO TBCR
40.0000 meq | EXTENDED_RELEASE_TABLET | Freq: Once | ORAL | Status: AC
  Administered 2020-07-08: 40 meq via ORAL
  Filled 2020-07-08: qty 2

## 2020-07-08 MED ORDER — HYDRALAZINE HCL 25 MG PO TABS: 25.0000 mg | ORAL_TABLET | ORAL | Status: DC | PRN

## 2020-07-08 MED ORDER — SODIUM CHLORIDE 0.9 % IV SOLN: INTRAVENOUS | Status: DC

## 2020-07-08 MED ORDER — SODIUM CHLORIDE 0.9% FLUSH
3.0000 mL | Freq: Two times a day (BID) | INTRAVENOUS | Status: DC
  Administered 2020-07-08 – 2020-07-11 (×5): 3 mL via INTRAVENOUS

## 2020-07-08 MED ORDER — INSULIN ASPART 100 UNIT/ML ~~LOC~~ SOLN
0.0000 [IU] | Freq: Three times a day (TID) | SUBCUTANEOUS | Status: DC
  Administered 2020-07-09: 3 [IU] via SUBCUTANEOUS
  Administered 2020-07-11: 1 [IU] via SUBCUTANEOUS

## 2020-07-08 MED ORDER — LEVETIRACETAM 500 MG PO TABS
1000.0000 mg | ORAL_TABLET | Freq: Two times a day (BID) | ORAL | Status: DC
  Administered 2020-07-08 – 2020-07-11 (×6): 1000 mg via ORAL
  Filled 2020-07-08 (×6): qty 2

## 2020-07-08 MED ORDER — ACETAMINOPHEN 650 MG RE SUPP: 650.0000 mg | Freq: Four times a day (QID) | RECTAL | Status: DC | PRN

## 2020-07-08 MED ORDER — LACTATED RINGERS IV BOLUS
1000.0000 mL | Freq: Once | INTRAVENOUS | Status: AC
  Administered 2020-07-08: 1000 mL via INTRAVENOUS

## 2020-07-08 MED ORDER — LABETALOL HCL 5 MG/ML IV SOLN: 10.0000 mg | INTRAVENOUS | Status: DC | PRN

## 2020-07-08 MED ORDER — HEPARIN SODIUM (PORCINE) 5000 UNIT/ML IJ SOLN
5000.0000 [IU] | Freq: Three times a day (TID) | INTRAMUSCULAR | Status: DC
  Administered 2020-07-08 – 2020-07-11 (×9): 5000 [IU] via SUBCUTANEOUS
  Filled 2020-07-08 (×9): qty 1

## 2020-07-08 MED ORDER — INSULIN ASPART 100 UNIT/ML ~~LOC~~ SOLN: 0.0000 [IU] | Freq: Every day | SUBCUTANEOUS | Status: DC

## 2020-07-08 MED ORDER — AMLODIPINE BESYLATE 5 MG PO TABS
5.0000 mg | ORAL_TABLET | Freq: Every day | ORAL | Status: DC
  Administered 2020-07-08 – 2020-07-11 (×4): 5 mg via ORAL
  Filled 2020-07-08 (×4): qty 1

## 2020-07-08 MED ORDER — ACETAMINOPHEN 325 MG PO TABS: 650.0000 mg | ORAL_TABLET | Freq: Four times a day (QID) | ORAL | Status: DC | PRN

## 2020-07-08 NOTE — ED Provider Notes (Signed)
[facility] EMERGENCY DEPARTMENT Provider Note   CSN: [id number] Arrival date & time: 07/08/20  1059     History Chief Complaint  Patient presents with  . Fall    Margaret Hernandez is a 87 y.o. female.  87 year old female with past medical history below including dementia, type 2 diabetes mellitus, seizures, hypertension who presents with falls.  Family member reports that the patient had a fall at the end of January and since then she has progressively declined.  She has had several other falls since then including 2 times today while at home.  Both times she was in another room during the house but family member states that when she heard the falls and got to her, patient was alert, she does not think she lost consciousness.  Patient does not recall details of these events.  She reports pain in her lower back which she has been having recently since one of the falls.  She also reports pain in her right upper arm.  She denies any other areas of pain.  Family member reports that she has had decreased appetite and decreased oral intake recently.  She has had constipation for which she gave her over-the-counter medication, no vomiting or diarrhea.  No fevers or cough/cold symptoms.  Patient denies any shortness of breath or abdominal pain. Of note, pt given pain medication recently for her back pain but family reports that decline started before pain medication.  LEVEL 5 CAVEAT DUE TO DEMENTIA  The history is provided by the patient and a relative.  Fall       Past Medical History:  Diagnosis Date  . Dementia (HCC)   . Diabetes mellitus without complication (HCC)   . Hypertension     Patient Active Problem List   Diagnosis Date Noted  . Awareness alteration, transient 08/28/2014  . Generalized seizure disorder (HCC) 08/28/2014  . Fall due to seizure [facility])   . Seizure (HCC)   . Status epilepticus (HCC) 04/23/2014  . Status epilepticus, generalized convulsive (HCC)  04/23/2014  . Diabetes (HCC) 12/24/2013  . Diabetes type 2, uncontrolled (HCC) 12/24/2013  . Dementia with behavioral disturbance (HCC) 12/24/2013  . Arthritis 10/23/2010  . HBP (high blood pressure) 10/23/2010    Past Surgical History:  Procedure Laterality Date  . ABDOMINAL HYSTERECTOMY    . SHOULDER SURGERY       OB History   No obstetric history on file.     Family History  Problem Relation Age of Onset  . Hypertension Mother   . Hypertension Father     Social History   Tobacco Use  . Smoking status: Never Smoker  Substance Use Topics  . Alcohol use: No  . Drug use: No    Home Medications Prior to Admission medications   Medication Sig Start Date End Date Taking? Authorizing Provider  amLODipine (NORVASC) 5 MG tablet Take 5 mg by mouth daily.    [provider]  aspirin 81 MG tablet Take 81 mg by mouth daily.    [provider]  atorvastatin (LIPITOR) 80 MG tablet Take 80 mg by mouth daily.    [provider]  calcium citrate (CALCITRATE - DOSED IN MG ELEMENTAL CALCIUM) 950 MG tablet Take 200 mg of elemental calcium by mouth daily.    [provider]  Cholecalciferol (VITAMIN D3) 50000 UNITS CAPS Take 1 capsule by mouth once a week. Once weekly for 13 weeks    [provider]  clopidogrel (PLAVIX)  75 MG tablet Take 75 mg by mouth daily with breakfast.    [provider]  docusate sodium (COLACE) 100 MG capsule Take 100 mg by mouth 2 (two) times daily.    [provider]  donepezil (ARICEPT) 10 MG tablet Take 10 mg by mouth at bedtime.    [provider]  Lacosamide 150 MG TABS Take 1 tablet by mouth 2 (two) times daily.    [provider]  levETIRAcetam (KEPPRA) 1000 MG tablet Take 1 tablet (1,000 mg total) by mouth 2 (two) times daily. 04/30/14   Clydia Llano, MD  lisinopril (PRINIVIL,ZESTRIL) 10 MG tablet Take 30 mg by mouth daily.     [provider]  memantine (NAMENDA) 10  MG tablet Take 10 mg by mouth 2 (two) times daily.    [provider]  modafinil (PROVIGIL) 100 MG tablet Take 100 mg by mouth every morning. 05/23/14   [provider]    Allergies    Patient has no known allergies.  Review of Systems   Review of Systems  Unable to perform ROS: Dementia    Physical Exam Updated Vital Signs BP (!) 149/63   Pulse 63   Temp 98 F (36.7 C) (Oral)   Resp 15   SpO2 98%   Physical Exam Constitutional:      General: She is not in acute distress.    Appearance: Normal appearance.  HENT:     Head: Normocephalic and atraumatic.  Eyes:     Conjunctiva/sclera: Conjunctivae normal.     Pupils: Pupils are equal, round, and reactive to light.  Cardiovascular:     Rate and Rhythm: Normal rate and regular rhythm.     Heart sounds: Normal heart sounds. No murmur heard.   Pulmonary:     Effort: Pulmonary effort is normal.     Breath sounds: Normal breath sounds.  Abdominal:     General: Abdomen is flat. Bowel sounds are normal. There is no distension.     Palpations: Abdomen is soft.     Tenderness: There is no abdominal tenderness.  Musculoskeletal:        General: Normal range of motion.     Cervical back: Neck supple. No tenderness.     Right lower leg: No edema.     Left lower leg: No edema.     Comments: Mild tenderness lumbar spine and R proximal humerus without obvious deformity  Skin:    General: Skin is warm and dry.  Neurological:     Mental Status: She is alert and oriented to person, place, and time.     Comments: Fluent speech, 5/5 strength throughout  Psychiatric:     Comments: Quiet, flat affect     ED Results / Procedures / Treatments   Labs (all labs ordered are listed, but only abnormal results are displayed) Labs Reviewed  BASIC METABOLIC PANEL - Abnormal; Notable for the following components:      Result Value   Potassium 3.0 (*)    Glucose, Bld 117 (*)    Creatinine, Ser 1.43 (*)    GFR, Estimated  35 (*)    All other components within normal limits  CBC - Abnormal; Notable for the following components:   Hemoglobin 11.0 (*)    HCT 35.0 (*)    MCV 79.2 (*)    MCH 24.9 (*)    All other components within normal limits  CBG MONITORING, ED - Abnormal; Notable for the following components:   Glucose-Capillary  106 (*)    All other components within normal limits  URINALYSIS, ROUTINE W REFLEX MICROSCOPIC  HEPATIC FUNCTION PANEL  AMMONIA  TSH  MAGNESIUM    EKG None  Radiology CT Head Wo Contrast  Result Date: 07/08/2020 CLINICAL DATA:  Frequent falls EXAM: CT HEAD WITHOUT CONTRAST TECHNIQUE: Contiguous axial images were obtained from the base of the skull through the vertex without intravenous contrast. COMPARISON:  May 2015 FINDINGS: Brain: There is no acute intracranial hemorrhage, mass effect, or edema. Gray-white differentiation is preserved. There is no extra-axial fluid collection. Ventricles and sulci are stable in size and configuration. Patchy areas of hypoattenuation supratentorial white are nonspecific but may reflect stable mild to moderate microvascular ischemic changes. Vascular: There is atherosclerotic calcification at the skull base. Skull: Calvarium is unremarkable. Sinuses/Orbits: No acute finding. Other: None. IMPRESSION: No acute intracranial abnormality. Electronically Signed   By: Guadlupe Spanish M.D.   On: 07/08/2020 15:51   CT Lumbar Spine Wo Contrast  Result Date: 07/08/2020 CLINICAL DATA:  Low back pain after frequent falls EXAM: CT LUMBAR SPINE WITHOUT CONTRAST TECHNIQUE: Multidetector CT imaging of the lumbar spine was performed without intravenous contrast administration. Multiplanar CT image reconstructions were also generated. COMPARISON:  None. FINDINGS: Segmentation: 5 lumbar type vertebrae. Alignment: Levocurvature.  Anteroposterior alignment is maintained. Vertebrae: There is compression fracture of L2 with mild loss of height at the superior endplate. No  significant osseous retropulsion. No other fracture identified. Paraspinal and other soft tissues: Aortic atherosclerosis. Left renal atrophy. Colonic diverticulosis. Disc levels: Multilevel degenerative changes with disc space narrowing, endplate osteophytes, and facet hypertrophy with ligamentum flavum thickening. These changes are greatest at L4-L5 where there is moderate canal stenosis and moderate right foraminal stenosis. IMPRESSION: L2 compression fracture with mild loss of height. Age-indeterminate but favor at least subacute. Electronically Signed   By: Guadlupe Spanish M.D.   On: 07/08/2020 15:43    Procedures Procedures   Medications Ordered in ED Medications  lactated ringers bolus 1,000 mL (1,000 mLs Intravenous New Bag/Given 07/08/20 1438)  potassium chloride SA (KLOR-CON) CR tablet 40 mEq (40 mEq Oral Given 07/08/20 1438)    ED Course  I have reviewed the triage vital signs and the nursing notes.  Pertinent labs & imaging results that were available during my care of the patient were reviewed by me and considered in my medical decision making (see chart for details).    MDM Rules/Calculators/A&P                          Pt awake, following commands, VS reassuring. No external signs of trauma. Obtained CT head and lumbar spine as well as R shoulder given falls and pain. Labs notable for K 3.0, Cr 1.4 with no recent for comparison, WBC normal. Gave IVF and potassium. Added Mg, thyroid studies, liver panel. Pt signed out pending completion of labs and imaging as well as reassessment.  Final Clinical Impression(s) / ED Diagnoses Final diagnoses:  None    Rx / DC Orders ED Discharge Orders    None       Neville Pauls, Ambrose Finland, MD 07/08/20 1558

## 2020-07-08 NOTE — Hospital Course (Signed)
Margaret Hernandez is an 87 yo female with PMH seizure disorder, HTN, mild dementia, DMII who presented to the hospital with worsening weakness at home and recent falls.  She is accompanied by her daughter in the ER who also helps provide further collateral information.  They state that her first fall was on 06/27/2020 when she fell in the kitchen.  She had been using her cane and has recently required using a walker more often.  She states that it feels like her legs "give out".  She denies any associated dizziness or numbness/tingling or actual weakness in her legs.  She cannot describe why they give out. She has had a decreased appetite and does think she has been drinking enough Gatorade/water however her daughter states that she has watched her not eat as well as usual. She also has been complaining of some constipation recently. [facility], but did not go last week due to her weakness.  Her previous hospitalization was in 2015 for a seizure episode and she was discharged on seizure medication which she says she has been on since.  She is unable to provide names.  She went to rehab for approximately 1 month after hospital discharge then has been home.  Due to her daughter being unable to manage her at home currently, they presented to the ER for evaluation.  CT head was unremarkable in the ER.  CT lumbar spine showed probable subacute compression fracture of L2 with no significant osseous retropulsion.  There was also noted to be degenerative disc changes notably at L4-L5 with moderate canal stenosis and moderate right foraminal stenosis.  Notable labs include: Na 139, K 3, Glucose 117, BUN 23, Creat 1.43 WBC 8.3, Hgb 11, Hct 35, PLTC 241 UA: nitrite neg, LE neg; rare bacteria, 0-5 WBC. Hyaline casts present NH3 11, TSH normal (1.02)  She is admitted for hydration in setting of AKI and PT evaluation as well as probable MRI.

## 2020-07-08 NOTE — Assessment & Plan Note (Signed)
-   per CT L spine: "Multilevel degenerative changes with disc space narrowing, endplate osteophytes, and facet hypertrophy with ligamentum flavum thickening. These changes are greatest at L4-L5 where there is moderate canal stenosis and moderate right foraminal Stenosis." -Will order MRI L-spine to further evaluate given recent falls with patient unable to control legs giving out.  No obvious neuro deficits on exam

## 2020-07-08 NOTE — Assessment & Plan Note (Signed)
-  Multiple falls at home for legs giving out.  Unclear etiology at this time -Await MRI results then will need PT eval

## 2020-07-08 NOTE — Assessment & Plan Note (Signed)
-  Status epilepticus noted in 2015 and she was discharged on Keppra and Vimpat -Have ordered for now but need to follow-up completion of med rec

## 2020-07-08 NOTE — Assessment & Plan Note (Signed)
-  Potassium 3 in ER -Check magnesium -Replete and recheck as needed

## 2020-07-08 NOTE — Assessment & Plan Note (Signed)
-   baseline creatinine ~ 0.8 - patient presents with increase in creat >0.3 mg/dL above baseline presumed to have occurred within past 7 days PTA; although last renal function noted in 2015 -Creatinine 1.43 and BUN 23 on admission.  Urinalysis shows hyaline casts, clinical picture consistent with prerenal/volume depletion from poor oral intake -Continue on fluids -Repeat BMP in a.m.

## 2020-07-08 NOTE — H&P (Signed)
History and Physical    Margaret Hernandez  ZOX:[id number]  DOB: 08-05-1929  DOA: 07/08/2020  PCP: [facility] Patient coming from: Home  Chief Complaint: Weakness and falling, poor appetite  HPI:  Ms. Grigg is an 87 yo female with PMH seizure disorder, HTN, mild dementia, DMII who presented to the hospital with worsening weakness at home and recent falls.  She is accompanied by her daughter in the ER who also helps provide further collateral information.  They state that her first fall was on 06/27/2020 when she fell in the kitchen.  She had been using her cane and has recently required using a walker more often.  She states that it feels like her legs "give out".  She denies any associated dizziness or numbness/tingling or actual weakness in her legs.  She cannot describe why they give out. She has had a decreased appetite and does think she has been drinking enough Gatorade/water however her daughter states that she has watched her not eat as well as usual. She also has been complaining of some constipation recently. [facility], but did not go last week due to her weakness.  Her previous hospitalization was in 2015 for a seizure episode and she was discharged on seizure medication which she says she has been on since.  She is unable to provide names.  She went to rehab for approximately 1 month after hospital discharge then has been home.  Due to her daughter being unable to manage her at home currently, they presented to the ER for evaluation.  CT head was unremarkable in the ER.  CT lumbar spine showed probable subacute compression fracture of L2 with no significant osseous retropulsion.  There was also noted to be degenerative disc changes notably at L4-L5 with moderate canal stenosis and moderate right foraminal stenosis.  Notable labs include: Na 139, K 3, Glucose 117, BUN 23, Creat 1.43 WBC 8.3, Hgb 11, Hct 35, PLTC 241 UA: nitrite neg,  LE neg; rare bacteria, 0-5 WBC. Hyaline casts present NH3 11, TSH normal (1.02)  She is admitted for hydration in setting of AKI and PT evaluation as well as probable MRI.   I have personally briefly reviewed patient's old medical records in [facility] and discussed patient with the ER provider when appropriate/indicated.  Assessment/Plan: * AKI (acute kidney injury) (HCC) - baseline creatinine ~ 0.8 - patient presents with increase in creat >0.3 mg/dL above baseline presumed to have occurred within past 7 days PTA; although last renal function noted in 2015 -Creatinine 1.43 and BUN 23 on admission.  Urinalysis shows hyaline casts, clinical picture consistent with prerenal/volume depletion from poor oral intake -Continue on fluids -Repeat BMP in a.m.   Lumbar stenosis - per CT L spine: "Multilevel degenerative changes with disc space narrowing, endplate osteophytes, and facet hypertrophy with ligamentum flavum thickening. These changes are greatest at L4-L5 where there is moderate canal stenosis and moderate right foraminal Stenosis." -Will order MRI L-spine to further evaluate given recent falls with patient unable to control legs giving out.  No obvious neuro deficits on exam  Microcytic anemia -Baseline hemoglobin approximately 9 to 10 g/dL -Hemoglobin is 11 g/dL on admission which may be hemoconcentrated in setting of dehydration -Follow-up CBC in a.m. -Check iron stores and given weakness will check folate/B12  Hypokalemia -Potassium 3 in ER -Check magnesium -Replete and recheck as needed  Physical deconditioning -Multiple falls at home for legs giving out.  Unclear etiology at this time -Await MRI results then will need PT eval  Seizure (HCC) -Status epilepticus noted in 2015 and she was discharged on Keppra and Vimpat -Have ordered for now but need to follow-up completion of med rec  Diabetes (HCC) -Check A1c -Continue SSI and CBG monitoring -Allowing for  regular diet for now     Code Status:   Full DVT Prophylaxis: Heparin     Anticipated disposition is to: Pending PT eval  History: Past Medical History:  Diagnosis Date  . Dementia (HCC)   . Diabetes mellitus without complication (HCC)   . Hypertension     Past Surgical History:  Procedure Laterality Date  . ABDOMINAL HYSTERECTOMY    . SHOULDER SURGERY       reports that she has never smoked. She does not have any smokeless tobacco history on file. She reports that she does not drink alcohol and does not use drugs.  No Known Allergies  Family History  Problem Relation Age of Onset  . Hypertension Mother   . Hypertension Father    Home Medications: Prior to Admission medications   Medication Sig Start Date End Date Taking? Authorizing Provider  amLODipine (NORVASC) 5 MG tablet Take 5 mg by mouth daily.    [provider]  aspirin 81 MG tablet Take 81 mg by mouth daily.    [provider]  atorvastatin (LIPITOR) 80 MG tablet Take 80 mg by mouth daily.    [provider]  calcium citrate (CALCITRATE - DOSED IN MG ELEMENTAL CALCIUM) 950 MG tablet Take 200 mg of elemental calcium by mouth daily.    [provider]  Cholecalciferol (VITAMIN D3) 50000 UNITS CAPS Take 1 capsule by mouth once a week. Once weekly for 13 weeks    [provider]  clopidogrel (PLAVIX) 75 MG tablet Take 75 mg by mouth daily with breakfast.    [provider]  docusate sodium (COLACE) 100 MG capsule Take 100 mg by mouth 2 (two) times daily.    [provider]  donepezil (ARICEPT) 10 MG tablet Take 10 mg by mouth at bedtime.    [provider]  Lacosamide 150 MG TABS Take 1 tablet by mouth 2 (two) times daily.    [provider]  levETIRAcetam (KEPPRA) 1000 MG tablet Take 1 tablet (1,000 mg total) by mouth 2 (two) times daily. 04/30/14   Clydia Llano, MD  lisinopril (PRINIVIL,ZESTRIL) 10 MG tablet Take 30 mg by mouth daily.      [provider]  memantine (NAMENDA) 10 MG tablet Take 10 mg by mouth 2 (two) times daily.    [provider]  modafinil (PROVIGIL) 100 MG tablet Take 100 mg by mouth every morning. 05/23/14   [provider]    Review of Systems:  Pertinent items noted in HPI and remainder of comprehensive ROS otherwise negative.  Physical Exam: Vitals:   07/08/20 1530 07/08/20 1559 07/08/20 1715 07/08/20 1800  BP:  (!) 171/70 (!) 174/75 (!) 166/73  Pulse: 74  80   Resp: 14 12 16 18   Temp:      TempSrc:      SpO2: 98%  98%    General appearance: Pleasant elderly woman resting in bed in no distress with daughter bedside Head: Normocephalic, without obvious abnormality, atraumatic Eyes: EOMI Lungs: clear to auscultation bilaterally Heart: regular rate and rhythm and S1, S2 normal Abdomen: normal findings: bowel sounds normal and soft, non-tender Extremities: No edema Skin: mobility and  turgor normal Neurologic: Alert and oriented x3, no focal deficits specifically in lower extremity  Labs on Admission:  I have personally reviewed following labs and imaging studies Results for orders placed or performed during the hospital encounter of 07/08/20 (from the past 24 hour(s))  Basic metabolic panel     Status: Abnormal   Collection Time: 07/08/20 11:13 AM  Result Value Ref Range   Sodium 139 135 - 145 mmol/L   Potassium 3.0 (L) 3.5 - 5.1 mmol/L   Chloride 98 98 - 111 mmol/L   CO2 28 22 - 32 mmol/L   Glucose, Bld 117 (H) 70 - 99 mg/dL   BUN 23 8 - 23 mg/dL   Creatinine, Ser 8.46 (H) 0.44 - 1.00 mg/dL   Calcium 96.2 8.9 - 95.2 mg/dL   GFR, Estimated 35 (L) >60 mL/min   Anion gap 13 5 - 15  CBC     Status: Abnormal   Collection Time: 07/08/20 11:13 AM  Result Value Ref Range   WBC 8.3 4.0 - 10.5 K/uL   RBC 4.42 3.87 - 5.11 MIL/uL   Hemoglobin 11.0 (L) 12.0 - 15.0 g/dL   HCT 84.1 (L) 32.4 - 40.1 %   MCV 79.2 (L) 80.0 - 100.0 fL   MCH 24.9 (L) 26.0 - 34.0 pg    MCHC 31.4 30.0 - 36.0 g/dL   RDW 02.7 25.3 - 66.4 %   Platelets 241 150 - 400 K/uL   nRBC 0.0 0.0 - 0.2 %  CBG monitoring, ED     Status: Abnormal   Collection Time: 07/08/20  1:20 PM  Result Value Ref Range   Glucose-Capillary 106 (H) 70 - 99 mg/dL  Urinalysis, Routine w reflex microscopic     Status: Abnormal   Collection Time: 07/08/20  4:44 PM  Result Value Ref Range   Color, Urine YELLOW YELLOW   APPearance CLEAR CLEAR   Specific Gravity, Urine 1.016 1.005 - 1.030   pH 5.0 5.0 - 8.0   Glucose, UA NEGATIVE NEGATIVE mg/dL   Hgb urine dipstick SMALL (A) NEGATIVE   Bilirubin Urine NEGATIVE NEGATIVE   Ketones, ur NEGATIVE NEGATIVE mg/dL   Protein, ur NEGATIVE NEGATIVE mg/dL   Nitrite NEGATIVE NEGATIVE   Leukocytes,Ua NEGATIVE NEGATIVE   RBC / HPF 0-5 0 - 5 RBC/hpf   WBC, UA 0-5 0 - 5 WBC/hpf   Bacteria, UA RARE (A) NONE SEEN   Mucus PRESENT    Hyaline Casts, UA PRESENT   Hepatic function panel     Status: Abnormal   Collection Time: 07/08/20  4:44 PM  Result Value Ref Range   Total Protein 6.3 (L) 6.5 - 8.1 g/dL   Albumin 3.3 (L) 3.5 - 5.0 g/dL   AST 20 15 - 41 U/L   ALT 11 0 - 44 U/L   Alkaline Phosphatase 66 38 - 126 U/L   Total Bilirubin 0.8 0.3 - 1.2 mg/dL   Bilirubin, Direct 0.3 (H) 0.0 - 0.2 mg/dL   Indirect Bilirubin 0.5 0.3 - 0.9 mg/dL  Ammonia     Status: None   Collection Time: 07/08/20  4:44 PM  Result Value Ref Range   Ammonia 11 9 - 35 umol/L  TSH     Status: None   Collection Time: 07/08/20  4:44 PM  Result Value Ref Range   TSH 1.020 0.350 - 4.500 uIU/mL  Magnesium     Status: Abnormal   Collection Time: 07/08/20  4:44 PM  Result Value Ref Range  Magnesium 1.6 (L) 1.7 - 2.4 mg/dL     Radiological Exams on Admission: DG Shoulder Right  Result Date: 07/08/2020 CLINICAL DATA:  Proximal humerus pain after fall EXAM: RIGHT SHOULDER - 2+ VIEW COMPARISON:  Chest radiograph July 18, 2018. FINDINGS: There is no evidence of fracture or dislocation. Mild  elevation of the humeral head relation to the glenoid. Mild AC joint arthropathy. Productive change along the greater tubercle. Soft tissues are unremarkable. Visualized lung field is clear. IMPRESSION: 1. No acute osseous abnormality. 2. Mild elevation of the humeral head relation to the glenoid which may indicate rotator cuff pathology. Electronically Signed   By: Maudry Mayhew MD   On: 07/08/2020 16:37   CT Head Wo Contrast  Result Date: 07/08/2020 CLINICAL DATA:  Frequent falls EXAM: CT HEAD WITHOUT CONTRAST TECHNIQUE: Contiguous axial images were obtained from the base of the skull through the vertex without intravenous contrast. COMPARISON:  May 2015 FINDINGS: Brain: There is no acute intracranial hemorrhage, mass effect, or edema. Gray-white differentiation is preserved. There is no extra-axial fluid collection. Ventricles and sulci are stable in size and configuration. Patchy areas of hypoattenuation supratentorial white are nonspecific but may reflect stable mild to moderate microvascular ischemic changes. Vascular: There is atherosclerotic calcification at the skull base. Skull: Calvarium is unremarkable. Sinuses/Orbits: No acute finding. Other: None. IMPRESSION: No acute intracranial abnormality. Electronically Signed   By: Guadlupe Spanish M.D.   On: 07/08/2020 15:51   CT Lumbar Spine Wo Contrast  Result Date: 07/08/2020 CLINICAL DATA:  Low back pain after frequent falls EXAM: CT LUMBAR SPINE WITHOUT CONTRAST TECHNIQUE: Multidetector CT imaging of the lumbar spine was performed without intravenous contrast administration. Multiplanar CT image reconstructions were also generated. COMPARISON:  None. FINDINGS: Segmentation: 5 lumbar type vertebrae. Alignment: Levocurvature.  Anteroposterior alignment is maintained. Vertebrae: There is compression fracture of L2 with mild loss of height at the superior endplate. No significant osseous retropulsion. No other fracture identified. Paraspinal and other soft  tissues: Aortic atherosclerosis. Left renal atrophy. Colonic diverticulosis. Disc levels: Multilevel degenerative changes with disc space narrowing, endplate osteophytes, and facet hypertrophy with ligamentum flavum thickening. These changes are greatest at L4-L5 where there is moderate canal stenosis and moderate right foraminal stenosis. IMPRESSION: L2 compression fracture with mild loss of height. Age-indeterminate but favor at least subacute. Electronically Signed   By: Guadlupe Spanish M.D.   On: 07/08/2020 15:43   DG Shoulder Right  Final Result    CT Head Wo Contrast  Final Result    CT Lumbar Spine Wo Contrast  Final Result      Consults called:  n/a     Lewie Chamber, MD [facility] 07/08/2020, 7:01 PM

## 2020-07-08 NOTE — Assessment & Plan Note (Signed)
-  Baseline hemoglobin approximately 9 to 10 g/dL -Hemoglobin is 11 g/dL on admission which may be hemoconcentrated in setting of dehydration -Follow-up CBC in a.m. -Check iron stores and given weakness will check folate/B12

## 2020-07-08 NOTE — ED Notes (Signed)
Patient transported to MRI 

## 2020-07-08 NOTE — ED Triage Notes (Signed)
Pt arrives from home via [facility] from home due to falls with frequent falls that started on 1/28. Family has noted a slow decline is patients mental status over the last 2 weeks , pt arrives with ems no family unclear exactly what decline they have noticed. Pt arrives to ER alert and oriented to place time and situation.  She lives with her daughter and states she is unsure why she keeps falling but now is having back pain from the fall in her low back.

## 2020-07-08 NOTE — ED Provider Notes (Signed)
Care of the patient assumed at the change of shift. Patient with several recent falls, now too weak to stand and get around without significant help from daughter, can no longer perform ADLs.  Physical Exam  BP (!) 174/75   Pulse 80   Temp 98 F (36.7 C) (Oral)   Resp 16   SpO2 98%   Physical Exam Rest in bed No distress ED Course/Procedures     Procedures  MDM  Patient's labs reviewed mild increase in Cr from remote baseline, no more recent values to compare. Daughter states her back has bothered her for a while, but more severe since fall on Jan 28, lumbar compression fracture may be from that fall. Regardless, patient's daughter is unable to care for her in her current state. Will discuss admission with Hospitalist.   6:40 PM Spoke with Dr. Armando Reichert, Hospitalist, who will evaluate the patient for admission.       Pollyann Savoy, MD 07/08/20 [phone]

## 2020-07-08 NOTE — Assessment & Plan Note (Signed)
-  Check A1c -Continue SSI and CBG monitoring -Allowing for regular diet for now

## 2020-07-08 NOTE — ED Notes (Signed)
Pt transported to CT ?

## 2020-07-08 NOTE — ED Notes (Signed)
Patient transported to X-ray 

## 2020-07-09 ENCOUNTER — Other Ambulatory Visit: Payer: Self-pay

## 2020-07-09 ENCOUNTER — Encounter (HOSPITAL_COMMUNITY): Payer: Self-pay | Admitting: Internal Medicine

## 2020-07-09 DIAGNOSIS — E876 Hypokalemia: Secondary | ICD-10-CM

## 2020-07-09 DIAGNOSIS — R569 Unspecified convulsions: Secondary | ICD-10-CM

## 2020-07-09 DIAGNOSIS — S32029A Unspecified fracture of second lumbar vertebra, initial encounter for closed fracture: Secondary | ICD-10-CM

## 2020-07-09 LAB — CBC WITH DIFFERENTIAL/PLATELET
Abs Immature Granulocytes: 0.02 10*3/uL (ref 0.00–0.07)
Basophils Absolute: 0.1 10*3/uL (ref 0.0–0.1)
Basophils Relative: 1 %
Eosinophils Absolute: 0.2 10*3/uL (ref 0.0–0.5)
Eosinophils Relative: 2 %
HCT: 32.7 % — ABNORMAL LOW (ref 36.0–46.0)
Hemoglobin: 10 g/dL — ABNORMAL LOW (ref 12.0–15.0)
Immature Granulocytes: 0 %
Lymphocytes Relative: 19 %
Lymphs Abs: 1.6 10*3/uL (ref 0.7–4.0)
MCH: 24.1 pg — ABNORMAL LOW (ref 26.0–34.0)
MCHC: 30.6 g/dL (ref 30.0–36.0)
MCV: 78.8 fL — ABNORMAL LOW (ref 80.0–100.0)
Monocytes Absolute: 0.8 10*3/uL (ref 0.1–1.0)
Monocytes Relative: 10 %
Neutro Abs: 5.7 10*3/uL (ref 1.7–7.7)
Neutrophils Relative %: 68 %
Platelets: 247 10*3/uL (ref 150–400)
RBC: 4.15 MIL/uL (ref 3.87–5.11)
RDW: 13.8 % (ref 11.5–15.5)
WBC: 8.3 10*3/uL (ref 4.0–10.5)
nRBC: 0 % (ref 0.0–0.2)

## 2020-07-09 LAB — BASIC METABOLIC PANEL
Anion gap: 10 (ref 5–15)
BUN: 19 mg/dL (ref 8–23)
CO2: 30 mmol/L (ref 22–32)
Calcium: 9.6 mg/dL (ref 8.9–10.3)
Chloride: 99 mmol/L (ref 98–111)
Creatinine, Ser: 1.04 mg/dL — ABNORMAL HIGH (ref 0.44–1.00)
GFR, Estimated: 52 mL/min — ABNORMAL LOW (ref >60)
Glucose, Bld: 149 mg/dL — ABNORMAL HIGH (ref 70–99)
Potassium: 3.3 mmol/L — ABNORMAL LOW (ref 3.5–5.1)
Sodium: 139 mmol/L (ref 135–145)

## 2020-07-09 LAB — GLUCOSE, CAPILLARY
Glucose-Capillary: 82 mg/dL (ref 70–99)
Glucose-Capillary: 93 mg/dL (ref 70–99)

## 2020-07-09 LAB — CBG MONITORING, ED
Glucose-Capillary: 133 mg/dL — ABNORMAL HIGH (ref 70–99)
Glucose-Capillary: 109 mg/dL — ABNORMAL HIGH (ref 70–99)
Glucose-Capillary: 121 mg/dL — ABNORMAL HIGH (ref 70–99)

## 2020-07-09 LAB — MAGNESIUM: Magnesium: 1.4 mg/dL — ABNORMAL LOW (ref 1.7–2.4)

## 2020-07-09 MED ORDER — SODIUM CHLORIDE 0.9% FLUSH
10.0000 mL | INTRAVENOUS | Status: DC | PRN
Start: 2020-07-09 — End: 2020-07-11

## 2020-07-09 MED ORDER — MAGNESIUM SULFATE 2 GM/50ML IV SOLN
2.0000 g | Freq: Once | INTRAVENOUS | Status: AC
  Administered 2020-07-09: 2 g via INTRAVENOUS
  Filled 2020-07-09: qty 50

## 2020-07-09 MED ORDER — POTASSIUM CHLORIDE CRYS ER 20 MEQ PO TBCR
40.0000 meq | EXTENDED_RELEASE_TABLET | Freq: Once | ORAL | Status: AC
  Administered 2020-07-09: 40 meq via ORAL
  Filled 2020-07-09: qty 2

## 2020-07-09 NOTE — Progress Notes (Signed)
Patient ID: Margaret Hernandez, female   DOB: [birth year], 87 y.o.   MRN: [id number]  PROGRESS NOTE    CAREN GARSKE  TKZ:[id number] DOB: 05/05/1930 DOA: 07/08/2020 PCP: [facility]   Brief Narrative:  87 year old female with history of seizure disorder, hypertension, dementia, diabetes mellitus type 2 presented with worsening weakness and recent falls and decreased appetite.  On presentation, CT of the head was unremarkable.  CT of the lumbar spine showed probable subacute compression fracture of L2 with no significant osseous retropulsion along with degenerative disc changes at L4-L5 with moderate canal stenosis and moderate right foraminal stenosis.  Creatinine was 1.43.  She was started on IV fluids and MRI of lumbar spine was ordered.  Assessment & Plan:   Acute kidney injury -Baseline creatinine of 0.8.  Presented with creatinine of 1.43.  Creatinine 1.04 and improving.  Decrease normal saline to 75 cc an hour - repeat a.m. creatinine  Acute/subacute L2 fracture Generalized weakness and recurrent falls -CT of L-spine: "Multilevel degenerative changes with disc space narrowing, endplate osteophytes, and facet hypertrophy with ligamentum flavum thickening. These changes are greatest at L4-L5 where there is moderate canal stenosis and moderate right foraminal Stenosis."  MRI of lumbar spine showed acute to subacute L2 fracture.  I have consulted neurosurgery/Dr. Franky Macho -No obvious neuro deficits on exam.  PT/OT eval  Chronic microcytic anemia -Hemoglobin stable.  No signs of bleeding  Hypokalemia -Replace.  Repeat a.m. labs  Hypomagnesemia -Replace.  Repeat a.m. labs  Seizure -Continue Keppra and lacosamide.  Outpatient follow-up with neurology  Diabetes mellitus type 2 -A1c 6.4.  Continue CBGs with SSI   DVT prophylaxis: Heparin Code Status:  Full Family Communication: None Disposition Plan: Status is: Inpatient  Remains inpatient appropriate  because:Inpatient level of care appropriate due to severity of illness. Pending PT eval   Dispo: The patient is from: Home              Anticipated d/c is to: Home              Anticipated d/c date is: 1 day . Pending PT eval              Patient currently is not medically stable to d/c.   Difficult to place patient No  Consultants: Neurosurgery  Procedures: None  Antimicrobials: None   Subjective: Patient seen and examined at bedside. Poor historian. Denies severe back pain. No overnight fever or vomiting reported.  Objective: Vitals:   07/09/20 0745 07/09/20 0800 07/09/20 0815 07/09/20 1000  BP: 124/79 (!) 158/67  (!) 156/75  Pulse:  84 79 73  Resp: 12 16 15 17   Temp:      TempSrc:      SpO2:  97% 91% 96%    Intake/Output Summary (Last 24 hours) at 07/09/2020 1129 Last data filed at 07/09/2020 09/06/2020 Gross per 24 hour  Intake 50 ml  Output -  Net 50 ml   There were no vitals filed for this visit.  Examination:  General exam: Appears calm and comfortable.  Poor historian.  Currently on room air. Respiratory system: Bilateral decreased breath sounds at bases Cardiovascular system: S1 & S2 heard, Rate controlled Gastrointestinal system: Abdomen is nondistended, soft and nontender. Normal bowel sounds heard. Extremities: No cyanosis, clubbing, edema  Central nervous system: Alert and oriented.  Very slow to respond.  Does not participate in conversation much.  No focal neurological deficits. Moving extremities Skin: No rashes, lesions or  ulcers Psychiatry: Flat affect   Data Reviewed: I have personally reviewed following labs and imaging studies  CBC: Recent Labs  Lab 07/08/20 1113 07/09/20 0400  WBC 8.3 8.3  NEUTROABS  --  5.7  HGB 11.0* 10.0*  HCT 35.0* 32.7*  MCV 79.2* 78.8*  PLT 241 247   Basic Metabolic Panel: Recent Labs  Lab 07/08/20 1113 07/08/20 1644 07/09/20 0400  NA 139  --  139  K 3.0*  --  3.3*  CL 98  --  99  CO2 28  --  30  GLUCOSE  117*  --  149*  BUN 23  --  19  CREATININE 1.43*  --  1.04*  CALCIUM 10.2  --  9.6  MG  --  1.6* 1.4*   GFR: CrCl cannot be calculated (Unknown ideal weight.). Liver Function Tests: Recent Labs  Lab 07/08/20 1644  AST 20  ALT 11  ALKPHOS 66  BILITOT 0.8  PROT 6.3*  ALBUMIN 3.3*   No results for input(s): LIPASE, AMYLASE in the last 168 hours. Recent Labs  Lab 07/08/20 1644  AMMONIA 11   Coagulation Profile: No results for input(s): INR, PROTIME in the last 168 hours. Cardiac Enzymes: No results for input(s): CKTOTAL, CKMB, CKMBINDEX, TROPONINI in the last 168 hours. BNP (last 3 results) No results for input(s): PROBNP in the last 8760 hours. HbA1C: Recent Labs    07/08/20 1113  HGBA1C 6.4*   CBG: Recent Labs  Lab 07/08/20 1320 07/09/20 0211 07/09/20 0821  GLUCAP 106* 133* 109*   Lipid Profile: No results for input(s): CHOL, HDL, LDLCALC, TRIG, CHOLHDL, LDLDIRECT in the last 72 hours. Thyroid Function Tests: Recent Labs    07/08/20 1644  TSH 1.020   Anemia Panel: Recent Labs    07/08/20 1644  VITAMINB12 390  FOLATE 8.0  FERRITIN 376*  TIBC 221*  IRON 24*   Sepsis Labs: No results for input(s): PROCALCITON, LATICACIDVEN in the last 168 hours.  Recent Results (from the past 240 hour(s))  Resp Panel by RT-PCR (Flu A&B, Covid) Nasopharyngeal Swab     Status: None   Collection Time: 07/08/20  6:56 PM   Specimen: Nasopharyngeal Swab; Nasopharyngeal(NP) swabs in vial transport medium  Result Value Ref Range Status   SARS Coronavirus 2 by RT PCR NEGATIVE NEGATIVE Final    Comment: (NOTE) SARS-CoV-2 target nucleic acids are NOT DETECTED.  The SARS-CoV-2 RNA is generally detectable in upper respiratory specimens during the acute phase of infection. The lowest concentration of SARS-CoV-2 viral copies this assay can detect is 138 copies/mL. A negative result does not preclude SARS-Cov-2 infection and should not be used as the sole basis for treatment  or other patient management decisions. A negative result may occur with  improper specimen collection/handling, submission of specimen other than nasopharyngeal swab, presence of viral mutation(s) within the areas targeted by this assay, and inadequate number of viral copies(<138 copies/mL). A negative result must be combined with clinical observations, patient history, and epidemiological information. The expected result is Negative.  Fact Sheet for Patients:  BloggerCourse.com  Fact Sheet for Healthcare Providers:  SeriousBroker.it  This test is no t yet approved or cleared by the [place] FDA and  has been authorized for detection and/or diagnosis of SARS-CoV-2 by FDA under an Emergency Use Authorization (EUA). This EUA will remain  in effect (meaning this test can be used) for the duration of the COVID-19 declaration under Section 564(b)(1) of the Act, 21 U.S.C.section 360bbb-3(b)(1), unless the authorization  is terminated  or revoked sooner.       Influenza A by PCR NEGATIVE NEGATIVE Final   Influenza B by PCR NEGATIVE NEGATIVE Final    Comment: (NOTE) The Xpert Xpress SARS-CoV-2/FLU/RSV plus assay is intended as an aid in the diagnosis of influenza from Nasopharyngeal swab specimens and should not be used as a sole basis for treatment. Nasal washings and aspirates are unacceptable for Xpert Xpress SARS-CoV-2/FLU/RSV testing.  Fact Sheet for Patients: BloggerCourse.com  Fact Sheet for Healthcare Providers: SeriousBroker.it  This test is not yet approved or cleared by the [place] FDA and has been authorized for detection and/or diagnosis of SARS-CoV-2 by FDA under an Emergency Use Authorization (EUA). This EUA will remain in effect (meaning this test can be used) for the duration of the COVID-19 declaration under Section 564(b)(1) of the Act, 21 U.S.C. section  360bbb-3(b)(1), unless the authorization is terminated or revoked.  Performed at [facility], [address], [place], [place] [address]          Radiology Studies: DG Shoulder Right  Result Date: 07/08/2020 CLINICAL DATA:  Proximal humerus pain after fall EXAM: RIGHT SHOULDER - 2+ VIEW COMPARISON:  Chest radiograph July 18, 2018. FINDINGS: There is no evidence of fracture or dislocation. Mild elevation of the humeral head relation to the glenoid. Mild AC joint arthropathy. Productive change along the greater tubercle. Soft tissues are unremarkable. Visualized lung field is clear. IMPRESSION: 1. No acute osseous abnormality. 2. Mild elevation of the humeral head relation to the glenoid which may indicate rotator cuff pathology. Electronically Signed   By: Maudry Mayhew MD   On: 07/08/2020 16:37   CT Head Wo Contrast  Result Date: 07/08/2020 CLINICAL DATA:  Frequent falls EXAM: CT HEAD WITHOUT CONTRAST TECHNIQUE: Contiguous axial images were obtained from the base of the skull through the vertex without intravenous contrast. COMPARISON:  May 2015 FINDINGS: Brain: There is no acute intracranial hemorrhage, mass effect, or edema. Gray-white differentiation is preserved. There is no extra-axial fluid collection. Ventricles and sulci are stable in size and configuration. Patchy areas of hypoattenuation supratentorial white are nonspecific but may reflect stable mild to moderate microvascular ischemic changes. Vascular: There is atherosclerotic calcification at the skull base. Skull: Calvarium is unremarkable. Sinuses/Orbits: No acute finding. Other: None. IMPRESSION: No acute intracranial abnormality. Electronically Signed   By: Guadlupe Spanish M.D.   On: 07/08/2020 15:51   CT Lumbar Spine Wo Contrast  Result Date: 07/08/2020 CLINICAL DATA:  Low back pain after frequent falls EXAM: CT LUMBAR SPINE WITHOUT CONTRAST TECHNIQUE: Multidetector CT imaging of the lumbar spine was performed without  intravenous contrast administration. Multiplanar CT image reconstructions were also generated. COMPARISON:  None. FINDINGS: Segmentation: 5 lumbar type vertebrae. Alignment: Levocurvature.  Anteroposterior alignment is maintained. Vertebrae: There is compression fracture of L2 with mild loss of height at the superior endplate. No significant osseous retropulsion. No other fracture identified. Paraspinal and other soft tissues: Aortic atherosclerosis. Left renal atrophy. Colonic diverticulosis. Disc levels: Multilevel degenerative changes with disc space narrowing, endplate osteophytes, and facet hypertrophy with ligamentum flavum thickening. These changes are greatest at L4-L5 where there is moderate canal stenosis and moderate right foraminal stenosis. IMPRESSION: L2 compression fracture with mild loss of height. Age-indeterminate but favor at least subacute. Electronically Signed   By: Guadlupe Spanish M.D.   On: 07/08/2020 15:43   MR LUMBAR SPINE WO CONTRAST  Result Date: 07/08/2020 CLINICAL DATA:  Initial evaluation for acute low back pain. EXAM:  MRI LUMBAR SPINE WITHOUT CONTRAST TECHNIQUE: Multiplanar, multisequence MR imaging of the lumbar spine was performed. No intravenous contrast was administered. COMPARISON:  Prior CT from earlier the same day. FINDINGS: Segmentation: Standard. Lowest well-formed disc space labeled the L5-S1 level. Alignment: Mild levoscoliosis. Alignment otherwise normal with preservation of the normal lumbar lordosis. No listhesis. Vertebrae: Compression deformity seen involving the superior endplate of L2 with no more than mild 10-20% height loss without bony retropulsion. There is persistent marrow edema about this compression fracture, suggesting that this is acute to subacute in nature. This is benign/mechanical in appearance, with no underlying pathologic features. Trace 2 mm bony retropulsion. Otherwise, vertebral body height maintained. Underlying bone marrow signal intensity  within normal limits. No discrete or worrisome osseous lesions. No other abnormal marrow edema. Conus medullaris and cauda equina: Conus extends to the L1-2 level. Conus and cauda equina appear normal. Paraspinal and other soft tissues: Paraspinous soft tissues within normal limits. Left kidney is markedly atrophic with multiple scattered cysts. Dilatation of the common bile duct noted, likely related to post cholecystectomy changes and advanced age. Disc levels: T11-12: Mild disc bulge with left greater than right facet hypertrophy. No spinal stenosis. Mild left foraminal narrowing. T12-L1: Mild diffuse disc bulge.  No canal or foraminal stenosis. L1-2: Disc desiccation with minimal disc bulge trace 2 mm bony retropulsion related to the L2 compression fracture. Mild facet hypertrophy. No significant spinal stenosis. Foramina remain patent. L2-3: Disc desiccation with minimal annular disc bulge. Mild facet and ligament flavum hypertrophy. No canal or foraminal stenosis. L3-4: Disc desiccation with mild far lateral disc bulging. Mild facet and ligament flavum hypertrophy. No significant spinal stenosis. Mild bilateral foraminal narrowing. No impingement. L4-5: Degenerative intervertebral disc space narrowing with diffuse disc bulge and disc desiccation. Superimposed broad-based right extraforaminal disc protrusion contacts the exiting right L4 nerve root as it courses of the right neural foramen (series 7, image 31). Superimposed right-sided reactive endplate change. Mild to moderate facet and ligament flavum hypertrophy. Trace joint effusion on the left. Resultant moderate canal with right greater than left lateral recess stenosis. Moderate right with mild left L4 foraminal narrowing. L5-S1: Negative interspace. Moderate left worse than right facet hypertrophy. No significant spinal stenosis. Foramina remain patent. IMPRESSION: 1. Acute to subacute compression fracture involving the superior endplate of L2 with no  more than mild 10-20% height loss with trace 2 mm bony retropulsion. No stenosis. 2. Degenerative disc bulge with facet hypertrophy at L4-5 with resultant moderate canal and right greater than left lateral recess stenosis, with moderate right L4 foraminal narrowing. 3. Additional mild for age noncompressive disc bulging elsewhere within the lumbar spine without significant stenosis or neural impingement. Electronically Signed   By: Rise Mu M.D.   On: 07/08/2020 20:45        Scheduled Meds: . amLODipine  5 mg Oral Daily  . heparin  5,000 Units Subcutaneous Q8H  . insulin aspart  0-5 Units Subcutaneous QHS  . insulin aspart  0-9 Units Subcutaneous TID WC  . lacosamide  150 mg Oral BID  . levETIRAcetam  1,000 mg Oral BID  . polyethylene glycol  17 g Oral Daily  . senna-docusate  1 tablet Oral BID  . sodium chloride flush  3 mL Intravenous Q12H   Continuous Infusions: . sodium chloride 75 mL/hr at 07/09/20 0805          Glade Lloyd, MD [facility] 07/09/2020, 11:29 AM

## 2020-07-09 NOTE — ED Notes (Signed)
+  MS Breakfast order placed 

## 2020-07-09 NOTE — Progress Notes (Signed)
Orthopedic Tech Progress Note Patient Details:  Margaret Hernandez 1930/03/28 [id number] Called order into [facility] Patient ID: [name], female   DOB: 23-Mar-1930, 87 y.o.   MRN: [id number]   Donald Pore 07/09/2020, 10:45 AM

## 2020-07-09 NOTE — Consult Note (Signed)
Reason for Consult:L2 compression fracture Referring Physician: [name] is an 87 y.o. female.  HPI: whom was admitted secondary to dehydration, weakness in the lower extremities, and frequent falls.  CT and Mri show a subacute L2 compression fracture. I am called for further evaluation.  Past Medical History:  Diagnosis Date  . Dementia (HCC)   . Diabetes mellitus without complication (HCC)   . Hypertension     Past Surgical History:  Procedure Laterality Date  . ABDOMINAL HYSTERECTOMY    . SHOULDER SURGERY      Family History  Problem Relation Age of Onset  . Hypertension Mother   . Hypertension Father     Social History:  reports that she has never smoked. She does not have any smokeless tobacco history on file. She reports that she does not drink alcohol and does not use drugs.  Allergies: No Known Allergies  Medications: I have reviewed the patient's current medications.  Results for orders placed or performed during the hospital encounter of 07/08/20 (from the past 48 hour(s))  Basic metabolic panel     Status: Abnormal   Collection Time: 07/08/20 11:13 AM  Result Value Ref Range   Sodium 139 135 - 145 mmol/L   Potassium 3.0 (L) 3.5 - 5.1 mmol/L   Chloride 98 98 - 111 mmol/L   CO2 28 22 - 32 mmol/L   Glucose, Bld 117 (H) 70 - 99 mg/dL    Comment: Glucose reference range applies only to samples taken after fasting for at least 8 hours.   BUN 23 8 - 23 mg/dL   Creatinine, Ser 0.17 (H) 0.44 - 1.00 mg/dL   Calcium 49.4 8.9 - 49.6 mg/dL   GFR, Estimated 35 (L) >60 mL/min    Comment: (NOTE) Calculated using the CKD-EPI Creatinine Equation (2021)    Anion gap 13 5 - 15    Comment: Performed at [facility], [address], [place], [place] [address]  CBC     Status: Abnormal   Collection Time: 07/08/20 11:13 AM  Result Value Ref Range   WBC 8.3 4.0 - 10.5 K/uL   RBC 4.42 3.87 - 5.11 MIL/uL   Hemoglobin 11.0 (L) 12.0 - 15.0 g/dL   HCT  38.4 (L) 66.5 - 46.0 %   MCV 79.2 (L) 80.0 - 100.0 fL   MCH 24.9 (L) 26.0 - 34.0 pg   MCHC 31.4 30.0 - 36.0 g/dL   RDW 99.3 57.0 - 17.7 %   Platelets 241 150 - 400 K/uL   nRBC 0.0 0.0 - 0.2 %    Comment: Performed at [facility], [address], [place], [place] [address]  Hemoglobin A1c     Status: Abnormal   Collection Time: 07/08/20 11:13 AM  Result Value Ref Range   Hgb A1c MFr Bld 6.4 (H) 4.8 - 5.6 %    Comment: (NOTE) Pre diabetes:          5.7%-6.4%  Diabetes:              >6.4%  Glycemic control for   <7.0% adults with diabetes    Mean Plasma Glucose 136.98 mg/dL    Comment: Performed at [facility], [address], [place], [place] [address]  CBG monitoring, ED     Status: Abnormal   Collection Time: 07/08/20  1:20 PM  Result Value Ref Range   Glucose-Capillary 106 (H) 70 - 99 mg/dL    Comment: Glucose  reference range applies only to samples taken after fasting for at least 8 hours.  Urinalysis, Routine w reflex microscopic     Status: Abnormal   Collection Time: 07/08/20  4:44 PM  Result Value Ref Range   Color, Urine YELLOW YELLOW   APPearance CLEAR CLEAR   Specific Gravity, Urine 1.016 1.005 - 1.030   pH 5.0 5.0 - 8.0   Glucose, UA NEGATIVE NEGATIVE mg/dL   Hgb urine dipstick SMALL (A) NEGATIVE   Bilirubin Urine NEGATIVE NEGATIVE   Ketones, ur NEGATIVE NEGATIVE mg/dL   Protein, ur NEGATIVE NEGATIVE mg/dL   Nitrite NEGATIVE NEGATIVE   Leukocytes,Ua NEGATIVE NEGATIVE   RBC / HPF 0-5 0 - 5 RBC/hpf   WBC, UA 0-5 0 - 5 WBC/hpf   Bacteria, UA RARE (A) NONE SEEN   Mucus PRESENT    Hyaline Casts, UA PRESENT     Comment: Performed at [facility], [address], [place], [place] [address]  Hepatic function panel     Status: Abnormal   Collection Time: 07/08/20  4:44 PM  Result Value Ref Range   Total Protein 6.3 (L) 6.5 - 8.1 g/dL   Albumin 3.3 (L) 3.5 - 5.0 g/dL   AST 20 15 - 41 U/L   ALT 11 0 - 44 U/L   Alkaline Phosphatase 66 38 - 126  U/L   Total Bilirubin 0.8 0.3 - 1.2 mg/dL   Bilirubin, Direct 0.3 (H) 0.0 - 0.2 mg/dL   Indirect Bilirubin 0.5 0.3 - 0.9 mg/dL    Comment: Performed at [facility], [address] [place], [place] [address]  Ammonia     Status: None   Collection Time: 07/08/20  4:44 PM  Result Value Ref Range   Ammonia 11 9 - 35 umol/L    Comment: Performed at [facility], [address] [place], [place] [address]  TSH     Status: None   Collection Time: 07/08/20  4:44 PM  Result Value Ref Range   TSH 1.020 0.350 - 4.500 uIU/mL    Comment: Performed by a 3rd Generation assay with a functional sensitivity of <=0.01 uIU/mL. Performed at [facility], [address], [place], [place] [address]   Magnesium     Status: Abnormal   Collection Time: 07/08/20  4:44 PM  Result Value Ref Range   Magnesium 1.6 (L) 1.7 - 2.4 mg/dL    Comment: Performed at [facility], [address], [place], [place] [address]  Iron and TIBC     Status: Abnormal   Collection Time: 07/08/20  4:44 PM  Result Value Ref Range   Iron 24 (L) 28 - 170 ug/dL   TIBC 130 (L) 865 - 784 ug/dL   Saturation Ratios 11 10.4 - 31.8 %   UIBC 197 ug/dL    Comment: Performed at [facility], [address] [place], [place] [address]  Ferritin     Status: Abnormal   Collection Time: 07/08/20  4:44 PM  Result Value Ref Range   Ferritin 376 (H) 11 - 307 ng/mL    Comment: Performed at [facility], [address] [place], [place] [address]  Folate, serum, performed at [facility] lab     Status: None   Collection Time: 07/08/20  4:44 PM  Result Value Ref Range   Folate 8.0 >5.9 ng/mL    Comment: Performed at [facility], [address], [place],  [place] [address]  Vitamin B12     Status: None   Collection Time: 07/08/20  4:44 PM  Result Value Ref Range   Vitamin B-12 390 180 - 914 pg/mL    Comment: (NOTE) This assay is not validated for testing neonatal or myeloproliferative syndrome  specimens for Vitamin B12 levels. Performed at [facility], [address] [place], [place] [address]   Resp Panel by RT-PCR (Flu A&B, Covid) Nasopharyngeal Swab     Status: None   Collection Time: 07/08/20  6:56 PM   Specimen: Nasopharyngeal Swab; Nasopharyngeal(NP) swabs in vial transport medium  Result Value Ref Range   SARS Coronavirus 2 by RT PCR NEGATIVE NEGATIVE    Comment: (NOTE) SARS-CoV-2 target nucleic acids are NOT DETECTED.  The SARS-CoV-2 RNA is generally detectable in upper respiratory specimens during the acute phase of infection. The lowest concentration of SARS-CoV-2 viral copies this assay can detect is 138 copies/mL. A negative result does not preclude SARS-Cov-2 infection and should not be used as the sole basis for treatment or other patient management decisions. A negative result may occur with  improper specimen collection/handling, submission of specimen other than nasopharyngeal swab, presence of viral mutation(s) within the areas targeted by this assay, and inadequate number of viral copies(<138 copies/mL). A negative result must be combined with clinical observations, patient history, and epidemiological information. The expected result is Negative.  Fact Sheet for Patients:  BloggerCourse.comhttps://www.fda.gov/media/152166/download  Fact Sheet for Healthcare Providers:  SeriousBroker.ithttps://www.fda.gov/media/152162/download  This test is no t yet approved or cleared by the [place] FDA and  has been authorized for detection and/or diagnosis of SARS-CoV-2 by FDA under an Emergency Use Authorization (EUA). This EUA will remain  in effect (meaning this test can be used) for the duration of the COVID-19 declaration under Section 564(b)(1) of the Act, 21 U.S.C.section 360bbb-3(b)(1), unless the authorization is terminated  or revoked sooner.       Influenza A by PCR NEGATIVE NEGATIVE   Influenza B by PCR NEGATIVE NEGATIVE    Comment: (NOTE) The Xpert Xpress  SARS-CoV-2/FLU/RSV plus assay is intended as an aid in the diagnosis of influenza from Nasopharyngeal swab specimens and should not be used as a sole basis for treatment. Nasal washings and aspirates are unacceptable for Xpert Xpress SARS-CoV-2/FLU/RSV testing.  Fact Sheet for Patients: BloggerCourse.comhttps://www.fda.gov/media/152166/download  Fact Sheet for Healthcare Providers: SeriousBroker.ithttps://www.fda.gov/media/152162/download  This test is not yet approved or cleared by the [place] FDA and has been authorized for detection and/or diagnosis of SARS-CoV-2 by FDA under an Emergency Use Authorization (EUA). This EUA will remain in effect (meaning this test can be used) for the duration of the COVID-19 declaration under Section 564(b)(1) of the Act, 21 U.S.C. section 360bbb-3(b)(1), unless the authorization is terminated or revoked.  Performed at [facility], [address] [place], [place] [address]   CBG monitoring, ED     Status: Abnormal   Collection Time: 07/09/20  2:11 AM  Result Value Ref Range   Glucose-Capillary 133 (H) 70 - 99 mg/dL    Comment: Glucose reference range applies only to samples taken after fasting for at least 8 hours.  Basic metabolic panel     Status: Abnormal   Collection Time: 07/09/20  4:00 AM  Result Value Ref Range   Sodium 139 135 - 145 mmol/L   Potassium 3.3 (L) 3.5 - 5.1 mmol/L   Chloride 99 98 - 111 mmol/L   CO2 30 22 - 32 mmol/L  Glucose, Bld 149 (H) 70 - 99 mg/dL    Comment: Glucose reference range applies only to samples taken after fasting for at least 8 hours.   BUN 19 8 - 23 mg/dL   Creatinine, Ser 2.44 (H) 0.44 - 1.00 mg/dL   Calcium 9.6 8.9 - 01.0 mg/dL   GFR, Estimated 52 (L) >60 mL/min    Comment: (NOTE) Calculated using the CKD-EPI Creatinine Equation (2021)    Anion gap 10 5 - 15    Comment: Performed at [facility], [address], [place], [place] [address]  CBC with Differential/Platelet     Status: Abnormal   Collection  Time: 07/09/20  4:00 AM  Result Value Ref Range   WBC 8.3 4.0 - 10.5 K/uL   RBC 4.15 3.87 - 5.11 MIL/uL   Hemoglobin 10.0 (L) 12.0 - 15.0 g/dL   HCT 66.4 (L) 40.3 - 47.4 %   MCV 78.8 (L) 80.0 - 100.0 fL   MCH 24.1 (L) 26.0 - 34.0 pg   MCHC 30.6 30.0 - 36.0 g/dL   RDW 25.9 56.3 - 87.5 %   Platelets 247 150 - 400 K/uL   nRBC 0.0 0.0 - 0.2 %   Neutrophils Relative % 68 %   Neutro Abs 5.7 1.7 - 7.7 K/uL   Lymphocytes Relative 19 %   Lymphs Abs 1.6 0.7 - 4.0 K/uL   Monocytes Relative 10 %   Monocytes Absolute 0.8 0.1 - 1.0 K/uL   Eosinophils Relative 2 %   Eosinophils Absolute 0.2 0.0 - 0.5 K/uL   Basophils Relative 1 %   Basophils Absolute 0.1 0.0 - 0.1 K/uL   Immature Granulocytes 0 %   Abs Immature Granulocytes 0.02 0.00 - 0.07 K/uL    Comment: Performed at [facility], [address], [place], [place] [address]  Magnesium     Status: Abnormal   Collection Time: 07/09/20  4:00 AM  Result Value Ref Range   Magnesium 1.4 (L) 1.7 - 2.4 mg/dL    Comment: Performed at [facility], [address], [place], [place] [address]  CBG monitoring, ED     Status: Abnormal   Collection Time: 07/09/20  8:21 AM  Result Value Ref Range   Glucose-Capillary 109 (H) 70 - 99 mg/dL    Comment: Glucose reference range applies only to samples taken after fasting for at least 8 hours.    DG Shoulder Right  Result Date: 07/08/2020 CLINICAL DATA:  Proximal humerus pain after fall EXAM: RIGHT SHOULDER - 2+ VIEW COMPARISON:  Chest radiograph July 18, 2018. FINDINGS: There is no evidence of fracture or dislocation. Mild elevation of the humeral head relation to the glenoid. Mild AC joint arthropathy. Productive change along the greater tubercle. Soft tissues are unremarkable. Visualized lung field is clear. IMPRESSION: 1. No acute osseous abnormality. 2. Mild elevation of the humeral head relation to the glenoid which may indicate rotator cuff pathology. Electronically Signed   By: Maudry Mayhew MD   On: 07/08/2020 16:37   CT Head Wo Contrast  Result Date: 07/08/2020 CLINICAL DATA:  Frequent falls EXAM: CT HEAD WITHOUT CONTRAST TECHNIQUE: Contiguous axial images were obtained from the base of the skull through the vertex without intravenous contrast. COMPARISON:  May 2015 FINDINGS: Brain: There is no acute intracranial hemorrhage, mass effect, or edema. Gray-white differentiation is preserved. There is no extra-axial fluid collection. Ventricles and sulci are stable in size and configuration. Patchy areas of hypoattenuation supratentorial white  are nonspecific but may reflect stable mild to moderate microvascular ischemic changes. Vascular: There is atherosclerotic calcification at the skull base. Skull: Calvarium is unremarkable. Sinuses/Orbits: No acute finding. Other: None. IMPRESSION: No acute intracranial abnormality. Electronically Signed   By: Guadlupe Spanish M.D.   On: 07/08/2020 15:51   CT Lumbar Spine Wo Contrast  Result Date: 07/08/2020 CLINICAL DATA:  Low back pain after frequent falls EXAM: CT LUMBAR SPINE WITHOUT CONTRAST TECHNIQUE: Multidetector CT imaging of the lumbar spine was performed without intravenous contrast administration. Multiplanar CT image reconstructions were also generated. COMPARISON:  None. FINDINGS: Segmentation: 5 lumbar type vertebrae. Alignment: Levocurvature.  Anteroposterior alignment is maintained. Vertebrae: There is compression fracture of L2 with mild loss of height at the superior endplate. No significant osseous retropulsion. No other fracture identified. Paraspinal and other soft tissues: Aortic atherosclerosis. Left renal atrophy. Colonic diverticulosis. Disc levels: Multilevel degenerative changes with disc space narrowing, endplate osteophytes, and facet hypertrophy with ligamentum flavum thickening. These changes are greatest at L4-L5 where there is moderate canal stenosis and moderate right foraminal stenosis. IMPRESSION: L2 compression fracture  with mild loss of height. Age-indeterminate but favor at least subacute. Electronically Signed   By: Guadlupe Spanish M.D.   On: 07/08/2020 15:43   MR LUMBAR SPINE WO CONTRAST  Result Date: 07/08/2020 CLINICAL DATA:  Initial evaluation for acute low back pain. EXAM: MRI LUMBAR SPINE WITHOUT CONTRAST TECHNIQUE: Multiplanar, multisequence MR imaging of the lumbar spine was performed. No intravenous contrast was administered. COMPARISON:  Prior CT from earlier the same day. FINDINGS: Segmentation: Standard. Lowest well-formed disc space labeled the L5-S1 level. Alignment: Mild levoscoliosis. Alignment otherwise normal with preservation of the normal lumbar lordosis. No listhesis. Vertebrae: Compression deformity seen involving the superior endplate of L2 with no more than mild 10-20% height loss without bony retropulsion. There is persistent marrow edema about this compression fracture, suggesting that this is acute to subacute in nature. This is benign/mechanical in appearance, with no underlying pathologic features. Trace 2 mm bony retropulsion. Otherwise, vertebral body height maintained. Underlying bone marrow signal intensity within normal limits. No discrete or worrisome osseous lesions. No other abnormal marrow edema. Conus medullaris and cauda equina: Conus extends to the L1-2 level. Conus and cauda equina appear normal. Paraspinal and other soft tissues: Paraspinous soft tissues within normal limits. Left kidney is markedly atrophic with multiple scattered cysts. Dilatation of the common bile duct noted, likely related to post cholecystectomy changes and advanced age. Disc levels: T11-12: Mild disc bulge with left greater than right facet hypertrophy. No spinal stenosis. Mild left foraminal narrowing. T12-L1: Mild diffuse disc bulge.  No canal or foraminal stenosis. L1-2: Disc desiccation with minimal disc bulge trace 2 mm bony retropulsion related to the L2 compression fracture. Mild facet hypertrophy. No  significant spinal stenosis. Foramina remain patent. L2-3: Disc desiccation with minimal annular disc bulge. Mild facet and ligament flavum hypertrophy. No canal or foraminal stenosis. L3-4: Disc desiccation with mild far lateral disc bulging. Mild facet and ligament flavum hypertrophy. No significant spinal stenosis. Mild bilateral foraminal narrowing. No impingement. L4-5: Degenerative intervertebral disc space narrowing with diffuse disc bulge and disc desiccation. Superimposed broad-based right extraforaminal disc protrusion contacts the exiting right L4 nerve root as it courses of the right neural foramen (series 7, image 31). Superimposed right-sided reactive endplate change. Mild to moderate facet and ligament flavum hypertrophy. Trace joint effusion on the left. Resultant moderate canal with right greater than left lateral recess stenosis. Moderate right with mild left  L4 foraminal narrowing. L5-S1: Negative interspace. Moderate left worse than right facet hypertrophy. No significant spinal stenosis. Foramina remain patent. IMPRESSION: 1. Acute to subacute compression fracture involving the superior endplate of L2 with no more than mild 10-20% height loss with trace 2 mm bony retropulsion. No stenosis. 2. Degenerative disc bulge with facet hypertrophy at L4-5 with resultant moderate canal and right greater than left lateral recess stenosis, with moderate right L4 foraminal narrowing. 3. Additional mild for age noncompressive disc bulging elsewhere within the lumbar spine without significant stenosis or neural impingement. Electronically Signed   By: Rise Mu M.D.   On: 07/08/2020 20:45    Review of Systems Blood pressure (!) 158/67, pulse 79, temperature 98 F (36.7 C), temperature source Oral, resp. rate 15, SpO2 91 %. Physical Exam Constitutional:      General: She is not in acute distress.    Appearance: Normal appearance.  HENT:     Head: Normocephalic and atraumatic.     Nose:  Nose normal.     Mouth/Throat:     Mouth: Mucous membranes are moist.     Pharynx: Oropharynx is clear.  Eyes:     Extraocular Movements: Extraocular movements intact.     Pupils: Pupils are equal, round, and reactive to light.  Pulmonary:     Effort: Pulmonary effort is normal.  Abdominal:     General: Abdomen is flat.  Musculoskeletal:        General: Normal range of motion.     Cervical back: Normal range of motion.  Skin:    General: Skin is warm and dry.  Neurological:     General: No focal deficit present.     Mental Status: She is alert and oriented to person, place, and time. Mental status is at baseline.     Sensory: Sensation is intact.     Motor: Weakness present.     Deep Tendon Reflexes: Reflexes normal. Babinski sign absent on the right side. Babinski sign absent on the left side.     Comments: In ED stretcher, moving all extremities Following commands No distress Gait not assessed Weakness on exam, nothing unusual for an 87 yo woman. Did not attempt to stand the patient.  Psychiatric:        Mood and Affect: Mood normal.        Behavior: Behavior normal.     Assessment/Plan: Margaret Hernandez is a 87 y.o. female With a subacute compression fracture which is stable at L2. Only bracing is needed, and the brace should be worn when out of bed. I will place order for brace. Follow up in 6 weeks.   Coletta Memos 07/09/2020, 10:05 AM

## 2020-07-09 NOTE — Progress Notes (Addendum)
PT Cancellation Note  Patient Details Name: Margaret Hernandez MRN: [id number] DOB: Jun 02, 1929   Cancelled Treatment:    Reason Eval/Treat Not Completed: Medical issues which prohibited therapy Pt with new L2 compression fx and awaiting neuro recommendations. Will hold and follow up as schedule allows.  1517:Attempted to see pt second time and IV team in room getting IV access. Will follow up as schedule allows.     Cindee Salt, DPT  Acute Rehabilitation Services  Pager: [phone] Office: [phone]    Margaret Hernandez 07/09/2020, 9:29 AM

## 2020-07-09 NOTE — Progress Notes (Signed)
Transition of Care [facility]) - CAGE-AID Screening   Patient Details  Name: Margaret Hernandez MRN: [id number] Date of Birth: [birth year]     [name], RN 07/09/2020, 1:55 PM   Clinical Narrative: Unable to participate due to history of dementia.    CAGE-AID Screening: Substance Abuse Screening unable to be completed due to: : Patient unable to participate (History of dementia)

## 2020-07-09 NOTE — ED Notes (Signed)
Lunch Tray Ordered @ 1046. 

## 2020-07-10 LAB — GLUCOSE, CAPILLARY
Glucose-Capillary: 120 mg/dL — ABNORMAL HIGH (ref 70–99)
Glucose-Capillary: 125 mg/dL — ABNORMAL HIGH (ref 70–99)
Glucose-Capillary: 112 mg/dL — ABNORMAL HIGH (ref 70–99)
Glucose-Capillary: 161 mg/dL — ABNORMAL HIGH (ref 70–99)

## 2020-07-10 LAB — CBC WITH DIFFERENTIAL/PLATELET
Abs Immature Granulocytes: 0.03 10*3/uL (ref 0.00–0.07)
Basophils Absolute: 0 10*3/uL (ref 0.0–0.1)
Basophils Relative: 1 %
Eosinophils Absolute: 0.2 10*3/uL (ref 0.0–0.5)
Eosinophils Relative: 3 %
HCT: 30.9 % — ABNORMAL LOW (ref 36.0–46.0)
Hemoglobin: 10.3 g/dL — ABNORMAL LOW (ref 12.0–15.0)
Immature Granulocytes: 0 %
Lymphocytes Relative: 26 %
Lymphs Abs: 1.9 10*3/uL (ref 0.7–4.0)
MCH: 25.6 pg — ABNORMAL LOW (ref 26.0–34.0)
MCHC: 33.3 g/dL (ref 30.0–36.0)
MCV: 76.7 fL — ABNORMAL LOW (ref 80.0–100.0)
Monocytes Absolute: 0.7 10*3/uL (ref 0.1–1.0)
Monocytes Relative: 10 %
Neutro Abs: 4.4 10*3/uL (ref 1.7–7.7)
Neutrophils Relative %: 60 %
Platelets: 219 10*3/uL (ref 150–400)
RBC: 4.03 MIL/uL (ref 3.87–5.11)
RDW: 13.6 % (ref 11.5–15.5)
WBC: 7.3 10*3/uL (ref 4.0–10.5)
nRBC: 0 % (ref 0.0–0.2)

## 2020-07-10 LAB — BASIC METABOLIC PANEL
Anion gap: 12 (ref 5–15)
BUN: 12 mg/dL (ref 8–23)
CO2: 24 mmol/L (ref 22–32)
Calcium: 9 mg/dL (ref 8.9–10.3)
Chloride: 100 mmol/L (ref 98–111)
Creatinine, Ser: 0.82 mg/dL (ref 0.44–1.00)
GFR, Estimated: >60 mL/min (ref >60)
Glucose, Bld: 100 mg/dL — ABNORMAL HIGH (ref 70–99)
Potassium: 3.4 mmol/L — ABNORMAL LOW (ref 3.5–5.1)
Sodium: 136 mmol/L (ref 135–145)

## 2020-07-10 LAB — MAGNESIUM: Magnesium: 1.7 mg/dL (ref 1.7–2.4)

## 2020-07-10 MED ORDER — POTASSIUM CHLORIDE CRYS ER 20 MEQ PO TBCR
40.0000 meq | EXTENDED_RELEASE_TABLET | Freq: Once | ORAL | Status: AC
  Administered 2020-07-10: 40 meq via ORAL
  Filled 2020-07-10: qty 2

## 2020-07-10 NOTE — Progress Notes (Addendum)
Patient ID: Margaret Hernandez, female   DOB: [birth year], 87 y.o.   MRN: [id number]  PROGRESS NOTE    Margaret Hernandez  SJG:[id number] DOB: 1929-11-06 DOA: 07/08/2020 PCP: [facility]   Brief Narrative:  87 year old female with history of seizure disorder, hypertension, dementia, diabetes mellitus type 2 presented with worsening weakness and recent falls and decreased appetite.  On presentation, CT of the head was unremarkable.  CT of the lumbar spine showed probable subacute compression fracture of L2 with no significant osseous retropulsion along with degenerative disc changes at L4-L5 with moderate canal stenosis and moderate right foraminal stenosis.  Creatinine was 1.43.  She was started on IV fluids and MRI of lumbar spine was ordered.  Assessment & Plan:   Acute kidney injury -Baseline creatinine of 0.8.  Presented with creatinine of 1.43.  Creatinine has improved to 0.82 this morning.  DC IV fluids -repeat a.m. creatinine  Acute/subacute L2 fracture Generalized weakness and recurrent falls -CT of L-spine: "Multilevel degenerative changes with disc space narrowing, endplate osteophytes, and facet hypertrophy with ligamentum flavum thickening. These changes are greatest at L4-L5 where there is moderate canal stenosis and moderate right foraminal Stenosis."  MRI of lumbar spine showed acute to subacute L2 fracture.  -Neurosurgery evaluation appreciated: Neurosurgery recommends bracing when out of bed with outpatient follow-up in 6 weeks -No obvious neuro deficits on exam.  PT/OT eval  Hypokalemia -Replace.  Repeat a.m. labs  Chronic microcytic anemia -Hemoglobin stable.  No signs of bleeding  Hypomagnesemia -Improved  Seizure -Continue Keppra and lacosamide.  Outpatient follow-up with neurology  Diabetes mellitus type 2 -A1c 6.4.  Continue CBGs with SSI  Generalized conditioning -Consult palliative care for goals of care discussion.  Patient is  currently full code.   DVT prophylaxis: Heparin Code Status:  Full Family Communication: Spoke to daughter/[name] on phone on 07/10/20 Disposition Plan: Status is: Inpatient  Remains inpatient appropriate because:Inpatient level of care appropriate due to severity of illness. Pending PT eval   Dispo: The patient is from: Home              Anticipated d/c is to: Home              Anticipated d/c date is: 1 day . Pending PT eval              Patient currently is not medically stable to d/c.   Difficult to place patient No  Consultants: Neurosurgery  Procedures: None  Antimicrobials: None   Subjective: Patient seen and examined at bedside.  Patient is a poor historian.  No overnight fever, vomiting or worsening shortness of breath reported.  Complains of intermittent back pain. Objective: Vitals:   07/09/20 1406 07/09/20 1510 07/09/20 2042 07/10/20 0439  BP:  (!) 146/81 133/67 (!) 169/80  Pulse:  80 71 84  Resp:  16 15 18   Temp: 98.3 F (36.8 C) 97.6 F (36.4 C) 98.5 F (36.9 C) 99.6 F (37.6 C)  TempSrc: Oral Axillary    SpO2:  99% 100% 99%    Intake/Output Summary (Last 24 hours) at 07/10/2020 0753 Last data filed at 07/10/2020 09/07/2020 Gross per 24 hour  Intake 2716.32 ml  Output 250 ml  Net 2466.32 ml   There were no vitals filed for this visit.  Examination:  General exam: No acute distress.  Chronically ill looking.  Poor historian.  Currently still on room air.   Respiratory system: Decreased breath sounds at bases bilaterally  with some scattered crackles Cardiovascular system: Rate controlled, S1-S2 heard  gastrointestinal system: Abdomen is nondistended, soft and nontender.  Bowel sounds are heard  extremities: No edema or clubbing Central nervous system: Awake; extremity slow to respond.  Does not participate in conversation much.  No focal neurological deficits.  Moves extremities Skin: No obvious ecchymosis/lesions Psychiatry: Affect is flat   Data  Reviewed: I have personally reviewed following labs and imaging studies  CBC: Recent Labs  Lab 07/08/20 1113 07/09/20 0400 07/10/20 0042  WBC 8.3 8.3 7.3  NEUTROABS  --  5.7 4.4  HGB 11.0* 10.0* 10.3*  HCT 35.0* 32.7* 30.9*  MCV 79.2* 78.8* 76.7*  PLT 241 247 219   Basic Metabolic Panel: Recent Labs  Lab 07/08/20 1113 07/08/20 1644 07/09/20 0400 07/10/20 0042  NA 139  --  139 136  K 3.0*  --  3.3* 3.4*  CL 98  --  99 100  CO2 28  --  30 24  GLUCOSE 117*  --  149* 100*  BUN 23  --  19 12  CREATININE 1.43*  --  1.04* 0.82  CALCIUM 10.2  --  9.6 9.0  MG  --  1.6* 1.4* 1.7   GFR: CrCl cannot be calculated (Unknown ideal weight.). Liver Function Tests: Recent Labs  Lab 07/08/20 1644  AST 20  ALT 11  ALKPHOS 66  BILITOT 0.8  PROT 6.3*  ALBUMIN 3.3*   No results for input(s): LIPASE, AMYLASE in the last 168 hours. Recent Labs  Lab 07/08/20 1644  AMMONIA 11   Coagulation Profile: No results for input(s): INR, PROTIME in the last 168 hours. Cardiac Enzymes: No results for input(s): CKTOTAL, CKMB, CKMBINDEX, TROPONINI in the last 168 hours. BNP (last 3 results) No results for input(s): PROBNP in the last 8760 hours. HbA1C: Recent Labs    07/08/20 1113  HGBA1C 6.4*   CBG: Recent Labs  Lab 07/09/20 0211 07/09/20 0821 07/09/20 1402 07/09/20 1649 07/09/20 2129  GLUCAP 133* 109* 121* 82 93   Lipid Profile: No results for input(s): CHOL, HDL, LDLCALC, TRIG, CHOLHDL, LDLDIRECT in the last 72 hours. Thyroid Function Tests: Recent Labs    07/08/20 1644  TSH 1.020   Anemia Panel: Recent Labs    07/08/20 1644  VITAMINB12 390  FOLATE 8.0  FERRITIN 376*  TIBC 221*  IRON 24*   Sepsis Labs: No results for input(s): PROCALCITON, LATICACIDVEN in the last 168 hours.  Recent Results (from the past 240 hour(s))  Resp Panel by RT-PCR (Flu A&B, Covid) Nasopharyngeal Swab     Status: None   Collection Time: 07/08/20  6:56 PM   Specimen: Nasopharyngeal  Swab; Nasopharyngeal(NP) swabs in vial transport medium  Result Value Ref Range Status   SARS Coronavirus 2 by RT PCR NEGATIVE NEGATIVE Final    Comment: (NOTE) SARS-CoV-2 target nucleic acids are NOT DETECTED.  The SARS-CoV-2 RNA is generally detectable in upper respiratory specimens during the acute phase of infection. The lowest concentration of SARS-CoV-2 viral copies this assay can detect is 138 copies/mL. A negative result does not preclude SARS-Cov-2 infection and should not be used as the sole basis for treatment or other patient management decisions. A negative result may occur with  improper specimen collection/handling, submission of specimen other than nasopharyngeal swab, presence of viral mutation(s) within the areas targeted by this assay, and inadequate number of viral copies(<138 copies/mL). A negative result must be combined with clinical observations, patient history, and epidemiological information. The expected result is  Negative.  Fact Sheet for Patients:  BloggerCourse.com  Fact Sheet for Healthcare Providers:  SeriousBroker.it  This test is no t yet approved or cleared by the [place] FDA and  has been authorized for detection and/or diagnosis of SARS-CoV-2 by FDA under an Emergency Use Authorization (EUA). This EUA will remain  in effect (meaning this test can be used) for the duration of the COVID-19 declaration under Section 564(b)(1) of the Act, 21 U.S.C.section 360bbb-3(b)(1), unless the authorization is terminated  or revoked sooner.       Influenza A by PCR NEGATIVE NEGATIVE Final   Influenza B by PCR NEGATIVE NEGATIVE Final    Comment: (NOTE) The Xpert Xpress SARS-CoV-2/FLU/RSV plus assay is intended as an aid in the diagnosis of influenza from Nasopharyngeal swab specimens and should not be used as a sole basis for treatment. Nasal washings and aspirates are unacceptable for Xpert Xpress  SARS-CoV-2/FLU/RSV testing.  Fact Sheet for Patients: BloggerCourse.com  Fact Sheet for Healthcare Providers: SeriousBroker.it  This test is not yet approved or cleared by the [place] FDA and has been authorized for detection and/or diagnosis of SARS-CoV-2 by FDA under an Emergency Use Authorization (EUA). This EUA will remain in effect (meaning this test can be used) for the duration of the COVID-19 declaration under Section 564(b)(1) of the Act, 21 U.S.C. section 360bbb-3(b)(1), unless the authorization is terminated or revoked.  Performed at [facility], [address], [place], [place] [address]          Radiology Studies: DG Shoulder Right  Result Date: 07/08/2020 CLINICAL DATA:  Proximal humerus pain after fall EXAM: RIGHT SHOULDER - 2+ VIEW COMPARISON:  Chest radiograph July 18, 2018. FINDINGS: There is no evidence of fracture or dislocation. Mild elevation of the humeral head relation to the glenoid. Mild AC joint arthropathy. Productive change along the greater tubercle. Soft tissues are unremarkable. Visualized lung field is clear. IMPRESSION: 1. No acute osseous abnormality. 2. Mild elevation of the humeral head relation to the glenoid which may indicate rotator cuff pathology. Electronically Signed   By: Maudry Mayhew MD   On: 07/08/2020 16:37   CT Head Wo Contrast  Result Date: 07/08/2020 CLINICAL DATA:  Frequent falls EXAM: CT HEAD WITHOUT CONTRAST TECHNIQUE: Contiguous axial images were obtained from the base of the skull through the vertex without intravenous contrast. COMPARISON:  May 2015 FINDINGS: Brain: There is no acute intracranial hemorrhage, mass effect, or edema. Gray-white differentiation is preserved. There is no extra-axial fluid collection. Ventricles and sulci are stable in size and configuration. Patchy areas of hypoattenuation supratentorial white are nonspecific but may reflect stable  mild to moderate microvascular ischemic changes. Vascular: There is atherosclerotic calcification at the skull base. Skull: Calvarium is unremarkable. Sinuses/Orbits: No acute finding. Other: None. IMPRESSION: No acute intracranial abnormality. Electronically Signed   By: Guadlupe Spanish M.D.   On: 07/08/2020 15:51   CT Lumbar Spine Wo Contrast  Result Date: 07/08/2020 CLINICAL DATA:  Low back pain after frequent falls EXAM: CT LUMBAR SPINE WITHOUT CONTRAST TECHNIQUE: Multidetector CT imaging of the lumbar spine was performed without intravenous contrast administration. Multiplanar CT image reconstructions were also generated. COMPARISON:  None. FINDINGS: Segmentation: 5 lumbar type vertebrae. Alignment: Levocurvature.  Anteroposterior alignment is maintained. Vertebrae: There is compression fracture of L2 with mild loss of height at the superior endplate. No significant osseous retropulsion. No other fracture identified. Paraspinal and other soft tissues: Aortic atherosclerosis. Left renal atrophy. Colonic diverticulosis. Disc levels: Multilevel degenerative  changes with disc space narrowing, endplate osteophytes, and facet hypertrophy with ligamentum flavum thickening. These changes are greatest at L4-L5 where there is moderate canal stenosis and moderate right foraminal stenosis. IMPRESSION: L2 compression fracture with mild loss of height. Age-indeterminate but favor at least subacute. Electronically Signed   By: Guadlupe SpanishPraneil  Patel M.D.   On: 07/08/2020 15:43   MR LUMBAR SPINE WO CONTRAST  Result Date: 07/08/2020 CLINICAL DATA:  Initial evaluation for acute low back pain. EXAM: MRI LUMBAR SPINE WITHOUT CONTRAST TECHNIQUE: Multiplanar, multisequence MR imaging of the lumbar spine was performed. No intravenous contrast was administered. COMPARISON:  Prior CT from earlier the same day. FINDINGS: Segmentation: Standard. Lowest well-formed disc space labeled the L5-S1 level. Alignment: Mild levoscoliosis. Alignment  otherwise normal with preservation of the normal lumbar lordosis. No listhesis. Vertebrae: Compression deformity seen involving the superior endplate of L2 with no more than mild 10-20% height loss without bony retropulsion. There is persistent marrow edema about this compression fracture, suggesting that this is acute to subacute in nature. This is benign/mechanical in appearance, with no underlying pathologic features. Trace 2 mm bony retropulsion. Otherwise, vertebral body height maintained. Underlying bone marrow signal intensity within normal limits. No discrete or worrisome osseous lesions. No other abnormal marrow edema. Conus medullaris and cauda equina: Conus extends to the L1-2 level. Conus and cauda equina appear normal. Paraspinal and other soft tissues: Paraspinous soft tissues within normal limits. Left kidney is markedly atrophic with multiple scattered cysts. Dilatation of the common bile duct noted, likely related to post cholecystectomy changes and advanced age. Disc levels: T11-12: Mild disc bulge with left greater than right facet hypertrophy. No spinal stenosis. Mild left foraminal narrowing. T12-L1: Mild diffuse disc bulge.  No canal or foraminal stenosis. L1-2: Disc desiccation with minimal disc bulge trace 2 mm bony retropulsion related to the L2 compression fracture. Mild facet hypertrophy. No significant spinal stenosis. Foramina remain patent. L2-3: Disc desiccation with minimal annular disc bulge. Mild facet and ligament flavum hypertrophy. No canal or foraminal stenosis. L3-4: Disc desiccation with mild far lateral disc bulging. Mild facet and ligament flavum hypertrophy. No significant spinal stenosis. Mild bilateral foraminal narrowing. No impingement. L4-5: Degenerative intervertebral disc space narrowing with diffuse disc bulge and disc desiccation. Superimposed broad-based right extraforaminal disc protrusion contacts the exiting right L4 nerve root as it courses of the right neural  foramen (series 7, image 31). Superimposed right-sided reactive endplate change. Mild to moderate facet and ligament flavum hypertrophy. Trace joint effusion on the left. Resultant moderate canal with right greater than left lateral recess stenosis. Moderate right with mild left L4 foraminal narrowing. L5-S1: Negative interspace. Moderate left worse than right facet hypertrophy. No significant spinal stenosis. Foramina remain patent. IMPRESSION: 1. Acute to subacute compression fracture involving the superior endplate of L2 with no more than mild 10-20% height loss with trace 2 mm bony retropulsion. No stenosis. 2. Degenerative disc bulge with facet hypertrophy at L4-5 with resultant moderate canal and right greater than left lateral recess stenosis, with moderate right L4 foraminal narrowing. 3. Additional mild for age noncompressive disc bulging elsewhere within the lumbar spine without significant stenosis or neural impingement. Electronically Signed   By: Rise MuBenjamin  McClintock M.D.   On: 07/08/2020 20:45        Scheduled Meds: . amLODipine  5 mg Oral Daily  . heparin  5,000 Units Subcutaneous Q8H  . insulin aspart  0-5 Units Subcutaneous QHS  . insulin aspart  0-9 Units Subcutaneous TID WC  .  lacosamide  150 mg Oral BID  . levETIRAcetam  1,000 mg Oral BID  . polyethylene glycol  17 g Oral Daily  . senna-docusate  1 tablet Oral BID  . sodium chloride flush  3 mL Intravenous Q12H   Continuous Infusions: . sodium chloride 75 mL/hr at 07/10/20 0655          Glade Lloyd, MD [facility] 07/10/2020, 7:53 AM

## 2020-07-10 NOTE — Evaluation (Signed)
Physical Therapy Evaluation Patient Details Name: Margaret Hernandez MRN: [id number] DOB: 09-21-1929 Today's Date: 07/10/2020   History of Present Illness  Pt is an 87 y.o. female admitted 07/08/20 with BLE weakness, falls, AKI. Imaging shows subacute L2 compression fx; plan for non-operative management with bracing. PMH includes dementia, DM, HTN.    Clinical Impression  Pt presents with an overall decrease in functional mobility secondary to above. PTA, pt typically mod indep with ADLs and ambulation using SPC, lives with daughter who assists as needed; recently, pt with increased weakness and falls requiring RW and increased assist. Educ pt on back precautions and brace wear; pt with little to no recall or carryover of education, totalA to don brace. Today, pt requiring modA to stand and maintain balance taking a few steps with RW; BUEs/BLEs tremulous with standing mobility. Pt would benefit from continued acute PT services to maximize functional mobility and independence prior to d/c with SNF-level therapies. Spoke with daughter [name]) on phone with update, family in agreement with SNF.     Follow Up Recommendations SNF;Supervision for mobility/OOB    Equipment Recommendations   (defer) - if return home, will need BSC, RW, wheelchair   Recommendations for Other Services       Precautions / Restrictions Precautions Precautions: Back;Fall Precaution Booklet Issued: No Precaution Comments: Educ on back precautions for comfort - pt with no carryover or recall Required Braces or Orthoses: Spinal Brace Spinal Brace: Lumbar corset;Other (comment) Spinal Brace Comments: "When out of bed" Restrictions Weight Bearing Restrictions: No      Mobility  Bed Mobility Overal bed mobility: Needs Assistance Bed Mobility: Rolling;Sidelying to Sit Rolling: Modified independent (Device/Increase time) Sidelying to sit: Min assist       General bed mobility comments: Pt received partially sidelying  with BLEs hanging over bed rail; minA to assist trunk elevation from this position, pt managing BLEs well to EOB and scooting forward    Transfers Overall transfer level: Needs assistance Equipment used: Rolling walker (2 wheeled) Transfers: Sit to/from Stand Sit to Stand: Mod assist         General transfer comment: Initial 3x sit<>stand from EOB to RW, pt requiring consistent modA to assist trunk elevation and cue hip/trunk extension, pt with 2x posterior LOB and return to sit, able to maintain standing balance on 3rd trial with modA and assist to manage RW. Very poor eccentric control into sitting  Ambulation/Gait Ambulation/Gait assistance: Mod assist Gait Distance (Feet): 1 Feet Assistive device: Rolling walker (2 wheeled) Gait Pattern/deviations: Step-to pattern;Trunk flexed;Narrow base of support;Leaning posteriorly Gait velocity: Decreased   General Gait Details: Initiated pivotal steps to recliner with RW and modA to maintain trunk elevation and prevent posterior LOB, BUEs/BLEs tremulous requiring modA to manage RW; pt with difficulty sequencing steps despite multimodal cues requiring chair to be brought up behind to allow sitting.  Stairs            Wheelchair Mobility    Modified Rankin (Stroke Patients Only)       Balance Overall balance assessment: Needs assistance   Sitting balance-Leahy Scale: Fair Sitting balance - Comments: Able to don socks, reliant on UE support to prevent anterior LOB from EOB; required assist to don shoes     Standing balance-Leahy Scale: Poor Standing balance comment: Reliant on UE support and external assist to maintain static standing balance  Pertinent Vitals/Pain Pain Assessment: Faces Faces Pain Scale: No hurt Pain Intervention(s): Monitored during session    Home Living Family/patient expects to be discharged to:: Skilled nursing facility Living Arrangements: Children Available  Help at Discharge: Family;Available 24 hours/day Type of Home: House           Additional Comments: Pt with h/o dementia, providing some correct info regarding PLOF/home set-up; called daughter [name]) to verify    Prior Function Level of Independence: Needs assistance   Gait / Transfers Assistance Needed: Typically mod indep with SPC; past few days requiring use of RW, falls  ADL's / Homemaking Assistance Needed: Typically sits in tub to bathe, daughter assists reaching back, otherwise mod indep with ADL tassk  Comments: Pt with h/o dementia, providing some correct info regarding PLOF/home set-up; called daughter [name]) to verify     Hand Dominance        Extremity/Trunk Assessment   Upper Extremity Assessment Upper Extremity Assessment: Generalized weakness    Lower Extremity Assessment Lower Extremity Assessment: RLE deficits/detail;LLE deficits/detail;Difficult to assess due to impaired cognition RLE Deficits / Details: Functionally at least 3/5 although tremulous with standing and ambulation; difficulty with command following for formal MMT testing RLE Coordination: decreased gross motor LLE Deficits / Details: Functionally at least 3/5 although tremulous with standing and ambulation; difficulty with command following for formal MMT testing LLE Coordination: decreased gross motor       Communication   Communication: No difficulties  Cognition Arousal/Alertness: Awake/alert Behavior During Therapy: Flat affect Overall Cognitive Status: History of cognitive impairments - at baseline Area of Impairment: Orientation;Attention;Memory;Following commands;Safety/judgement;Awareness;Problem solving                 Orientation Level: Disoriented to;Place;Time Current Attention Level: Sustained Memory: Decreased short-term memory;Decreased recall of precautions Following Commands: Follows one step commands with increased time;Follows multi-step commands  consistently Safety/Judgement: Decreased awareness of safety;Decreased awareness of deficits Awareness: Intellectual Problem Solving: Slow processing;Decreased initiation;Difficulty sequencing;Requires verbal cues;Requires tactile cues General Comments: Daughter reports h/o dementia. Pt initially stating she was at [facility], then thinking she was at a jail with family while on phone with daughter despite prior reorientation; January 2002. Pt at times keeping eyes closed but still interactive. Requires multimodal cuing with mobility      General Comments General comments (skin integrity, edema, etc.): TotalA to don back brace; pt with little to no awareness of maintaining back precautions despite educ. Spoke with daughter [name]) on phone during session regarding pt's current status, PLOF and d/c recommendations; daughter in agreement with recommendation for SNF-level therapies. Educ having her visit will likely help pt's current cognitive status regarding orientation, etc.    Exercises     Assessment/Plan    PT Assessment Patient needs continued PT services  PT Problem List Decreased strength;Decreased activity tolerance;Decreased balance;Decreased mobility;Decreased cognition;Decreased knowledge of use of DME;Decreased safety awareness;Decreased knowledge of precautions       PT Treatment Interventions DME instruction;Gait training;Stair training;Functional mobility training;Therapeutic activities;Therapeutic exercise;Balance training;Patient/family education;Cognitive remediation    PT Goals (Current goals can be found in the Care Plan section)  Acute Rehab PT Goals Patient Stated Goal: post-acute rehab at SNF before return home PT Goal Formulation: With patient/family Time For Goal Achievement: 07/24/20 Potential to Achieve Goals: Good    Frequency Min 3X/week   Barriers to discharge        Co-evaluation               AM-PAC PT "6 Clicks" Mobility  Outcome Measure Help needed turning from your back to your side while in a flat bed without using bedrails?: A Little Help needed moving from lying on your back to sitting on the side of a flat bed without using bedrails?: A Little Help needed moving to and from a bed to a chair (including a wheelchair)?: A Lot Help needed standing up from a chair using your arms (e.g., wheelchair or bedside chair)?: A Lot Help needed to walk in hospital room?: A Lot Help needed climbing 3-5 steps with a railing? : Total 6 Click Score: 13    End of Session Equipment Utilized During Treatment: Gait belt;Back brace Activity Tolerance: Patient tolerated treatment well Patient left: in chair;with call bell/phone within reach;with chair alarm set Nurse Communication: Mobility status PT Visit Diagnosis: Other abnormalities of gait and mobility (R26.89);Muscle weakness (generalized) (M62.81);Repeated falls (R29.6)    Time: 8242-3536 PT Time Calculation (min) (ACUTE ONLY): 28 min   Charges:   PT Evaluation $PT Eval Moderate Complexity: 1 Mod PT Treatments $Therapeutic Activity: 8-22 mins   Ina Homes, PT, DPT Acute Rehabilitation Services  Pager [phone] Office [phone]  Malachy Chamber 07/10/2020, 9:56 AM

## 2020-07-10 NOTE — NC FL2 (Signed)
[place] MEDICAID FL2 LEVEL OF CARE SCREENING TOOL     IDENTIFICATION  Patient Name: Margaret Hernandez Birthdate: April 04, 1930 Sex: female Admission Date (Current Location): 07/08/2020  [facility] and [place] Number:  Producer, television/film/video and Address:  [facility] [place], [address] [place], [place] [address]      Provider Number: [id number]  Attending Physician Name and Address:  Glade Lloyd, MD  Relative Name and Phone Number:       Current Level of Care: Hospital Recommended Level of Care: Skilled Nursing Facility Prior Approval Number:    Date Approved/Denied:   PASRR Number: [id number]  Discharge Plan: SNF    Current Diagnoses: Patient Active Problem List   Diagnosis Date Noted  . AKI (acute kidney injury) (HCC) 07/08/2020  . Physical deconditioning 07/08/2020  . Lumbar stenosis 07/08/2020  . Hypokalemia 07/08/2020  . Microcytic anemia 07/08/2020  . Awareness alteration, transient 08/28/2014  . Generalized seizure disorder (HCC) 08/28/2014  . Fall due to seizure [facility])   . Seizure (HCC)   . Status epilepticus (HCC) 04/23/2014  . Status epilepticus, generalized convulsive (HCC) 04/23/2014  . Diabetes (HCC) 12/24/2013  . Diabetes type 2, uncontrolled (HCC) 12/24/2013  . Dementia with behavioral disturbance (HCC) 12/24/2013  . Arthritis 10/23/2010  . HBP (high blood pressure) 10/23/2010    Orientation RESPIRATION BLADDER Height & Weight     Self  Normal Incontinent Weight:   Height:     BEHAVIORAL SYMPTOMS/MOOD NEUROLOGICAL BOWEL NUTRITION STATUS      Continent    AMBULATORY STATUS COMMUNICATION OF NEEDS Skin   Extensive Assist Verbally Normal                       Personal Care Assistance Level of Assistance  Bathing,Dressing Bathing Assistance: Limited assistance   Dressing Assistance: Limited assistance     Functional Limitations Info  Sight,Hearing,Speech Sight Info: Impaired Hearing Info: Adequate Speech Info:  Adequate    SPECIAL CARE FACTORS FREQUENCY  PT (By licensed PT),OT (By licensed OT)                    Contractures Contractures Info: Not present    Additional Factors Info  Code Status Code Status Info: FULL CODE             Current Medications (07/10/2020):  This is the current hospital active medication list Current Facility-Administered Medications  Medication Dose Route Frequency Provider Last Rate Last Admin  . acetaminophen (TYLENOL) tablet 650 mg  650 mg Oral Q6H PRN Lewie Chamber, MD       Or  . acetaminophen (TYLENOL) suppository 650 mg  650 mg Rectal Q6H PRN Lewie Chamber, MD      . amLODipine (NORVASC) tablet 5 mg  5 mg Oral Daily Lewie Chamber, MD   5 mg at 07/10/20 1022  . heparin injection 5,000 Units  5,000 Units Subcutaneous Eliezer Lofts, MD   5,000 Units at 07/10/20 1412  . hydrALAZINE (APRESOLINE) tablet 25 mg  25 mg Oral Q4H PRN Lewie Chamber, MD      . insulin aspart (novoLOG) injection 0-5 Units  0-5 Units Subcutaneous QHS Lewie Chamber, MD      . insulin aspart (novoLOG) injection 0-9 Units  0-9 Units Subcutaneous TID WC Lewie Chamber, MD   3 Units at 07/09/20 1406  . labetalol (NORMODYNE) injection 10 mg  10 mg Intravenous Q4H PRN Lewie Chamber, MD      .  lacosamide (VIMPAT) tablet 150 mg  150 mg Oral BID Lewie Chamber, MD   150 mg at 07/10/20 1022  . levETIRAcetam (KEPPRA) tablet 1,000 mg  1,000 mg Oral BID Lewie Chamber, MD   1,000 mg at 07/10/20 1022  . polyethylene glycol (MIRALAX / GLYCOLAX) packet 17 g  17 g Oral Daily Lewie Chamber, MD   17 g at 07/10/20 1021  . senna-docusate (Senokot-S) tablet 1 tablet  1 tablet Oral BID Lewie Chamber, MD   1 tablet at 07/10/20 1022  . sodium chloride flush (NS) 0.9 % injection 10-40 mL  10-40 mL Intracatheter PRN Alekh, Kshitiz, MD      . sodium chloride flush (NS) 0.9 % injection 3 mL  3 mL Intravenous Q12H Lewie Chamber, MD   3 mL at 07/10/20 1023     Discharge Medications: Please  see discharge summary for a list of discharge medications.  Relevant Imaging Results:  Relevant Lab Results:   Additional Information SS# [id number]  Deatra Robinson, [place]

## 2020-07-10 NOTE — TOC Initial Note (Signed)
Transition of Care [facility]) - Initial/Assessment Note    Patient Details  Name: Margaret Hernandez MRN: [id number] Date of Birth: [birth year]  Transition [facility]) CM/SW Contact:    Deatra Robinson, [place] Phone Number: 07/10/2020, 4:34 PM  Clinical Narrative:  SW spoke to pt's dtr [name] [phone] re PT recommendation for SNF. Pt from home with dtr. [facility] and [facility]. Pt with [facility] stay [facility]. Reviewed SNF placement process with dtr and answered questions. Dtr agreeable to SNF for STR and prefers [place] again. Notified [facility] [place] [facility] [place] auth request. Will f/u with offers to dtr once available.   Dellie Burns, MSW, LCSW [phone] (coverage)      Expected Discharge Plan: Skilled Nursing Facility Barriers to Discharge: Continued Medical Work up,No SNF bed,Insurance Authorization   Patient Goals and CMS Choice     Choice offered to / list presented to : Adult Children  Expected Discharge Plan and Services Expected Discharge Plan: Skilled Nursing Facility       Living arrangements for the past 2 months: Single Family Home                                      Prior Living Arrangements/Services Living arrangements for the past 2 months: Single Family Home Lives with:: Adult Children                   Activities of Daily Living Home Assistive Devices/Equipment: Environmental consultant (specify type),Cane (specify quad or straight) ADL Screening (condition at time of admission) Patient's cognitive ability adequate to safely complete daily activities?: No Is the patient deaf or have difficulty hearing?: No Does the patient have difficulty seeing, even when wearing glasses/contacts?: No Does the patient have difficulty concentrating, remembering, or making decisions?: Yes Patient able to express need for assistance with ADLs?: Yes Does the patient have difficulty dressing or bathing?: No Independently  performs ADLs?: Yes (appropriate for developmental age) Does the patient have difficulty walking or climbing stairs?: Yes Weakness of Legs: Both Weakness of Arms/Hands: None  Permission Sought/Granted Permission sought to share information with : Oceanographer granted to share information with : Yes, Verbal Permission Granted              Emotional Assessment       Orientation: : Oriented to Self      Admission diagnosis:  AKI (acute kidney injury) (HCC) [N17.9] Frequent falls [R29.6] Patient Active Problem List   Diagnosis Date Noted  . AKI (acute kidney injury) (HCC) 07/08/2020  . Physical deconditioning 07/08/2020  . Lumbar stenosis 07/08/2020  . Hypokalemia 07/08/2020  . Microcytic anemia 07/08/2020  . Awareness alteration, transient 08/28/2014  . Generalized seizure disorder (HCC) 08/28/2014  . Fall due to seizure [facility])   . Seizure (HCC)   . Status epilepticus (HCC) 04/23/2014  . Status epilepticus, generalized convulsive (HCC) 04/23/2014  . Diabetes (HCC) 12/24/2013  . Diabetes type 2, uncontrolled (HCC) 12/24/2013  . Dementia with behavioral disturbance (HCC) 12/24/2013  . Arthritis 10/23/2010  . HBP (high blood pressure) 10/23/2010   PCP:  [facility] Pharmacy:   [facility] [place], [place] - [address] [place] [address] Phone: [phone] Fax: [phone]     Social Determinants of Health (SDOH) Interventions  Readmission Risk Interventions No flowsheet data found.

## 2020-07-11 DIAGNOSIS — Z515 Encounter for palliative care: Secondary | ICD-10-CM

## 2020-07-11 DIAGNOSIS — Z7189 Other specified counseling: Secondary | ICD-10-CM

## 2020-07-11 DIAGNOSIS — Z66 Do not resuscitate: Secondary | ICD-10-CM

## 2020-07-11 LAB — CBC WITH DIFFERENTIAL/PLATELET
Abs Immature Granulocytes: 0.02 10*3/uL (ref 0.00–0.07)
Basophils Absolute: 0 10*3/uL (ref 0.0–0.1)
Basophils Relative: 1 %
Eosinophils Absolute: 0.2 10*3/uL (ref 0.0–0.5)
Eosinophils Relative: 2 %
HCT: 29.4 % — ABNORMAL LOW (ref 36.0–46.0)
Hemoglobin: 9.3 g/dL — ABNORMAL LOW (ref 12.0–15.0)
Immature Granulocytes: 0 %
Lymphocytes Relative: 34 %
Lymphs Abs: 2.4 10*3/uL (ref 0.7–4.0)
MCH: 24.6 pg — ABNORMAL LOW (ref 26.0–34.0)
MCHC: 31.6 g/dL (ref 30.0–36.0)
MCV: 77.8 fL — ABNORMAL LOW (ref 80.0–100.0)
Monocytes Absolute: 1 10*3/uL (ref 0.1–1.0)
Monocytes Relative: 15 %
Neutro Abs: 3.4 10*3/uL (ref 1.7–7.7)
Neutrophils Relative %: 48 %
Platelets: 230 10*3/uL (ref 150–400)
RBC: 3.78 MIL/uL — ABNORMAL LOW (ref 3.87–5.11)
RDW: 13.6 % (ref 11.5–15.5)
WBC: 7.1 10*3/uL (ref 4.0–10.5)
nRBC: 0 % (ref 0.0–0.2)

## 2020-07-11 LAB — BASIC METABOLIC PANEL
Anion gap: 8 (ref 5–15)
BUN: 14 mg/dL (ref 8–23)
CO2: 27 mmol/L (ref 22–32)
Calcium: 8.5 mg/dL — ABNORMAL LOW (ref 8.9–10.3)
Chloride: 102 mmol/L (ref 98–111)
Creatinine, Ser: 1.08 mg/dL — ABNORMAL HIGH (ref 0.44–1.00)
GFR, Estimated: 49 mL/min — ABNORMAL LOW (ref >60)
Glucose, Bld: 122 mg/dL — ABNORMAL HIGH (ref 70–99)
Potassium: 3.6 mmol/L (ref 3.5–5.1)
Sodium: 137 mmol/L (ref 135–145)

## 2020-07-11 LAB — MAGNESIUM: Magnesium: 1.7 mg/dL (ref 1.7–2.4)

## 2020-07-11 LAB — GLUCOSE, CAPILLARY
Glucose-Capillary: 96 mg/dL (ref 70–99)
Glucose-Capillary: 129 mg/dL — ABNORMAL HIGH (ref 70–99)

## 2020-07-11 MED ORDER — LACOSAMIDE 150 MG PO TABS: 150.0000 mg | ORAL_TABLET | Freq: Two times a day (BID) | ORAL | Status: AC

## 2020-07-11 MED ORDER — LEVETIRACETAM 750 MG PO TABS: 750.0000 mg | ORAL_TABLET | Freq: Two times a day (BID) | ORAL | Status: AC

## 2020-07-11 NOTE — Progress Notes (Signed)
Gave report to [place] at [place]. Pt going to [facility] A, [facility] will be transporting pt to the facility. Midline d/c, line intact, gauze in place. Pt had a bowel movement before d/c. Paperwork given to [facility] transporter. Family member  has belongings.

## 2020-07-11 NOTE — Progress Notes (Signed)
OT Cancellation Note  Patient Details Name: NICKIE WARWICK MRN: [id number] DOB: 25-Oct-1929   Cancelled Treatment:    Reason Eval/Treat Not Completed: OT screened, no needs identified, will sign off (defer to SNF [facility])  Wynona Neat, OTR/L  Acute Rehabilitation Services Pager: [phone] Office: [phone] .  07/11/2020, 3:34 PM

## 2020-07-11 NOTE — Progress Notes (Signed)
Patient ID: Margaret Hernandez, female   DOB: 1930-05-17, 87 y.o.   MRN: [id number]  PROGRESS NOTE    Margaret Hernandez  QQV:[id number] DOB: 03-10-1930 DOA: 07/08/2020 [facility]CP: [facility]   Brief Narrative:  87 year old female with history of seizure disorder, hypertension, dementia, diabetes mellitus type 2 presented with worsening weakness and recent falls and decreased appetite.  On presentation, CT of the head was unremarkable.  CT of the lumbar spine showed probable subacute compression fracture of L2 with no significant osseous retropulsion along with degenerative disc changes at L4-L5 with moderate canal stenosis and moderate right foraminal stenosis.  Creatinine was 1.43.  She was started on IV fluids and MRI of lumbar spine was ordered.  Assessment & Plan:   Acute kidney injury -Baseline creatinine of 0.8.  Presented with creatinine of 1.43.  Creatinine has improved; 1.08 this morning.  Off IV fluids.  Acute/subacute L2 fracture Generalized weakness and recurrent falls -CT of L-spine: "Multilevel degenerative changes with disc space narrowing, endplate osteophytes, and facet hypertrophy with ligamentum flavum thickening. These changes are greatest at L4-L5 where there is moderate canal stenosis and moderate right foraminal Stenosis."  MRI of lumbar spine showed acute to subacute L2 fracture.  -Neurosurgery evaluation appreciated: Neurosurgery recommends bracing when out of bed with outpatient follow-up in 6 weeks -No obvious neuro deficits on exam.  PT/OT recommend SNF placement.  Social worker consulted.  Hypokalemia -Replaced and improved.  Chronic microcytic anemia -Hemoglobin stable.  No signs of bleeding  Hypomagnesemia -Improved  Seizure -Continue Keppra and lacosamide.  Outpatient follow-up with neurology  Diabetes mellitus type 2 -A1c 6.4.  Continue CBGs with SSI  Generalized conditioning -Palliative care evaluation is pending.  Patient is  currently full code.   DVT prophylaxis: Heparin Code Status:  Full Family Communication: Spoke to daughter/[name] at bedside on 07/11/2020 Disposition Plan: Status is: Inpatient  Remains inpatient appropriate because:Inpatient level of care appropriate due to severity of illness.  Medically stable for discharge to SNF once bed is available  Dispo: The patient is from: Home              Anticipated d/c is to: SNF              Anticipated d/c date is: 1 day .               Patient currently is medically stable to d/c.   Difficult to place patient No  Consultants: Neurosurgery  Procedures: None  Antimicrobials: None   Subjective: Patient seen and examined at bedside.  Extremely poor historian.  No overnight fever, vomiting, worsening shortness of breath reported. Objective: Vitals:   07/10/20 0439 07/10/20 1434 07/10/20 2031 07/11/20 0453  BP: (!) 169/80 (!) 141/79 (!) 142/67 140/69  Pulse: 84 75 73 74  Resp: 18 17  17   Temp: 99.6 F (37.6 C) 98.8 F (37.1 C) 99 F (37.2 C) 98.9 F (37.2 C)  TempSrc:  Oral Oral Oral  SpO2: 99% 91% 99% 99%    Intake/Output Summary (Last 24 hours) at 07/11/2020 09/08/2020 Last data filed at 07/10/2020 1300 Gross per 24 hour  Intake 120 ml  Output 100 ml  Net 20 ml   There were no vitals filed for this visit.  Examination:  General exam: No distress.  Elderly female lying in bed.  Chronically ill looking.  Poor historian.  On room air currently.  Awake, does not participate in conversation much Respiratory system: Bilateral decreased breath  sounds at bases, no wheezing cardiovascular system: S1-S2 heard, rate controlled gastrointestinal system: Abdomen is nondistended, soft and nontender.  Normal bowel sounds are heard  extremities: No cyanosis, clubbing or edema   Data Reviewed: I have personally reviewed following labs and imaging studies  CBC: Recent Labs  Lab 07/08/20 1113 07/09/20 0400 07/10/20 0042 07/11/20 0041  WBC 8.3 8.3  7.3 7.1  NEUTROABS  --  5.7 4.4 3.4  HGB 11.0* 10.0* 10.3* 9.3*  HCT 35.0* 32.7* 30.9* 29.4*  MCV 79.2* 78.8* 76.7* 77.8*  PLT 241 247 219 230   Basic Metabolic Panel: Recent Labs  Lab 07/08/20 1113 07/08/20 1644 07/09/20 0400 07/10/20 0042 07/11/20 0041  NA 139  --  139 136 137  K 3.0*  --  3.3* 3.4* 3.6  CL 98  --  99 100 102  CO2 28  --  30 24 27   GLUCOSE 117*  --  149* 100* 122*  BUN 23  --  19 12 14   CREATININE 1.43*  --  1.04* 0.82 1.08*  CALCIUM 10.2  --  9.6 9.0 8.5*  MG  --  1.6* 1.4* 1.7 1.7   GFR: CrCl cannot be calculated (Unknown ideal weight.). Liver Function Tests: Recent Labs  Lab 07/08/20 1644  AST 20  ALT 11  ALKPHOS 66  BILITOT 0.8  PROT 6.3*  ALBUMIN 3.3*   No results for input(s): LIPASE, AMYLASE in the last 168 hours. Recent Labs  Lab 07/08/20 1644  AMMONIA 11   Coagulation Profile: No results for input(s): INR, PROTIME in the last 168 hours. Cardiac Enzymes: No results for input(s): CKTOTAL, CKMB, CKMBINDEX, TROPONINI in the last 168 hours. BNP (last 3 results) No results for input(s): PROBNP in the last 8760 hours. HbA1C: Recent Labs    07/08/20 1113  HGBA1C 6.4*   CBG: Recent Labs  Lab 07/10/20 0750 07/10/20 1228 07/10/20 1818 07/10/20 2033 07/11/20 0726  GLUCAP 120* 125* 112* 161* 96   Lipid Profile: No results for input(s): CHOL, HDL, LDLCALC, TRIG, CHOLHDL, LDLDIRECT in the last 72 hours. Thyroid Function Tests: Recent Labs    07/08/20 1644  TSH 1.020   Anemia Panel: Recent Labs    07/08/20 1644  VITAMINB12 390  FOLATE 8.0  FERRITIN 376*  TIBC 221*  IRON 24*   Sepsis Labs: No results for input(s): PROCALCITON, LATICACIDVEN in the last 168 hours.  Recent Results (from the past 240 hour(s))  Resp Panel by RT-PCR (Flu A&B, Covid) Nasopharyngeal Swab     Status: None   Collection Time: 07/08/20  6:56 PM   Specimen: Nasopharyngeal Swab; Nasopharyngeal(NP) swabs in vial transport medium  Result Value Ref  Range Status   SARS Coronavirus 2 by RT PCR NEGATIVE NEGATIVE Final    Comment: (NOTE) SARS-CoV-2 target nucleic acids are NOT DETECTED.  The SARS-CoV-2 RNA is generally detectable in upper respiratory specimens during the acute phase of infection. The lowest concentration of SARS-CoV-2 viral copies this assay can detect is 138 copies/mL. A negative result does not preclude SARS-Cov-2 infection and should not be used as the sole basis for treatment or other patient management decisions. A negative result may occur with  improper specimen collection/handling, submission of specimen other than nasopharyngeal swab, presence of viral mutation(s) within the areas targeted by this assay, and inadequate number of viral copies(<138 copies/mL). A negative result must be combined with clinical observations, patient history, and epidemiological information. The expected result is Negative.  Fact Sheet for Patients:  09/05/20  Fact  Sheet for Healthcare Providers:  SeriousBroker.it  This test is no t yet approved or cleared by the [place] FDA and  has been authorized for detection and/or diagnosis of SARS-CoV-2 by FDA under an Emergency Use Authorization (EUA). This EUA will remain  in effect (meaning this test can be used) for the duration of the COVID-19 declaration under Section 564(b)(1) of the Act, 21 U.S.C.section 360bbb-3(b)(1), unless the authorization is terminated  or revoked sooner.       Influenza A by PCR NEGATIVE NEGATIVE Final   Influenza B by PCR NEGATIVE NEGATIVE Final    Comment: (NOTE) The Xpert Xpress SARS-CoV-2/FLU/RSV plus assay is intended as an aid in the diagnosis of influenza from Nasopharyngeal swab specimens and should not be used as a sole basis for treatment. Nasal washings and aspirates are unacceptable for Xpert Xpress SARS-CoV-2/FLU/RSV testing.  Fact Sheet for  Patients: BloggerCourse.com  Fact Sheet for Healthcare Providers: SeriousBroker.it  This test is not yet approved or cleared by the [place] FDA and has been authorized for detection and/or diagnosis of SARS-CoV-2 by FDA under an Emergency Use Authorization (EUA). This EUA will remain in effect (meaning this test can be used) for the duration of the COVID-19 declaration under Section 564(b)(1) of the Act, 21 U.S.C. section 360bbb-3(b)(1), unless the authorization is terminated or revoked.  Performed at [facility], [address] [place], [place] [address]          Radiology Studies: No results found.      Scheduled Meds: . amLODipine  5 mg Oral Daily  . heparin  5,000 Units Subcutaneous Q8H  . insulin aspart  0-5 Units Subcutaneous QHS  . insulin aspart  0-9 Units Subcutaneous TID WC  . lacosamide  150 mg Oral BID  . levETIRAcetam  1,000 mg Oral BID  . polyethylene glycol  17 g Oral Daily  . senna-docusate  1 tablet Oral BID  . sodium chloride flush  3 mL Intravenous Q12H   Continuous Infusions:         Glade Lloyd, MD [facility] 07/11/2020, 7:42 AM

## 2020-07-11 NOTE — Care Management Important Message (Signed)
Important Message  Patient Details  Name: Margaret Hernandez MRN: [id number] Date of Birth: 31-Aug-1929   Medicare Important Message Given:  Yes     [name] Stefan Church 07/11/2020, 3:37 PM

## 2020-07-11 NOTE — TOC Transition Note (Signed)
Transition of Care [facility]) - CM/SW Discharge Note   Patient Details  Name: Margaret Hernandez MRN: [id number] Date of Birth: January 11, 1930  Transition of Care [facility]) CM/SW Contact:  Deatra Robinson, [place] Phone Number: 07/11/2020, 2:42 PM   Clinical Narrative: Bed offer received from [facility]. Notified pt's dtr who has accepted bed. Spoke to [place] at [facility] who confirmed they are able to admit pt to room 108 today. [facility] SW Judeth Cornfield is aware of dc plan and has approved SNF. [facility] to provide transport. RN provided with number for report. SW signing off at dc.   Dellie Burns, MSW, LCSW [phone] (coverage)       Final next level of care: Skilled Nursing Facility Barriers to Discharge: No Barriers Identified   Patient Goals and CMS Choice     Choice offered to / list presented to : Adult Children  Discharge Placement              Patient [facility] bed at[facility] Patient to be transferred to facility by: [facility]SP[facility] Name of family member notified: [name]/Dtr and Star/granddtr Patient and family notified of of transfer: 07/11/20  Discharge Plan and Services                                     Social Determinants of Health (SDOH) Interventions     Readmission Risk Interventions No flowsheet data found.

## 2020-07-11 NOTE — Consult Note (Signed)
Palliative Medicine Inpatient Consult Note  Reason for consult:  Goals of Care, Code Status  HPI:  Per intake H&P --> Margaret Hernandez is an 87 yo female with PMH seizure disorder, HTN, mild dementia, DMII who presented to the hospital with worsening weakness at home and recent falls.  She has also been noted to have a decreased appetite and Cr of 1.43. A CT lumbar spine was complete wihich revealed a probable subacute compression fracture of L2 with no significant osseous retropulsion along with degenerative disc changes at L4-L5 with moderate canal stenosis and moderate right foraminal stenosis. Palliative care was asked to get involved in the setting of advanced age and co-morbidities to discuss goals of care and code status.   Clinical Assessment/Goals of Care:  *Please note that this is a verbal dictation therefore any spelling or grammatical errors are due to the "Dragon Medical One" system interpretation.  I have reviewed medical records including EPIC notes, labs and imaging, received report from bedside RN, assessed the patient who was noted to be resting in NAD per PAIN-AD tool.    I called Margaret Hernandez (daughter) to further discuss diagnosis prognosis, GOC, EOL wishes, disposition and options.   I introduced Palliative Medicine as specialized medical care for people living with serious illness. It focuses on providing relief from the symptoms and stress of a serious illness. The goal is to improve quality of life for both the patient and the family.  Margaret Hernandez shares with me that her mother is from [place], [place].  She is a widow and has 2 children a son and a daughter.  She used to work as a Investment banker, operational at [employer] and most recently at a rehabilitation facility.  She is a woman who enjoys playing bingo and doing crossword puzzles.  She is a faithful woman and practices within the [place] faith she speaks to her minister regularly.   Prior to hospitalization Margaret Hernandez had been living with  her daughter, Margaret Hernandez. She is able to walk with a cane and [facility].  She does not cook but is able to bathe herself and dress herself.  Margaret Hernandez and I talked about Margaret Hernandez's recent falls and her lumbar fracture and how this could impact her moving forward.  Margaret Hernandez is optimistic in terms of her gaining her strength back.  We discussed her transitioning to skilled nursing and her daughter shares that she had a good experience at [facility] this is where she is hopeful she will be admitted.  A detailed discussion was had today regarding advanced directives there are none on file though patient's daughter Margaret Hernandez is her primary caregiver and her identified surrogate Management consultant.   Concepts specific to code status, artifical feeding and hydration, continued IV antibiotics and rehospitalization was had.    Margaret Hernandez shares with me that she and her mother have discussed CODE STATUS and her mother has been very clear about not wanting to "be hooked up to any machines".  We determined that she is a DO NOT RESUSCITATE DO NOT INTUBATE CODE STATUS.    We discussed the hope for improvement at rehabilitation and ongoing outpatient palliative care follow-up.  Discussed the importance of continued conversation with family and their  medical providers regarding overall plan of care and treatment options, ensuring decisions are within the context of the patients values and GOCs.  Decision Maker: Margaret Hernandez (daughter) [phone]  SUMMARY OF RECOMMENDATIONS   DNAR/DNI  Margaret Hernandez DNR placed on chart  OP Palliative Support  Code Status/Advance Care Planning: DNAR/DNI   Palliative Prophylaxis:   Oral care, mobility  Additional Recommendations (Limitations, Scope, Preferences):  Continue to treat what is treatable   Psycho-social/Spiritual:   Desire for further Chaplaincy support: No  Additional Recommendations: Education on Palliative care and code status   Prognosis:  Unclear  Discharge Planning: Discharge to [facility] with OP Palliative support  Vitals:   07/10/20 2031 07/11/20 0453  BP: (!) 142/67 140/69  Pulse: 73 74  Resp:  17  Temp: 99 F (37.2 C) 98.9 F (37.2 C)  SpO2: 99% 99%    Intake/Output Summary (Last 24 hours) at 07/11/2020 3220 Last data filed at 07/10/2020 1300 Gross per 24 hour  Intake 120 ml  Output 100 ml  Net 20 ml    Gen:  Elderly AA F in NAD HEENT: moist mucous membranes CV: Regular rate and rhythm  PULM: clear to auscultation bilaterally  ABD: soft/nontender  EXT: No edema  Neuro: Somnolent  PPS: 40%   This conversation/these recommendations were discussed with patient primary care team, Dr. Hanley Hernandez  Time In: 1200 Time Out: 1310 Total Time: 70 Greater than 50%  of this time was spent counseling and coordinating care related to the above assessment and plan.  Margaret Hernandez [facility] Palliative Medicine Team Team Cell Phone: [phone] Please utilize secure chat with additional questions, if there is no response within 30 minutes please call the above phone number  Palliative Medicine Team providers are available by phone from 7am to 7pm daily and can be reached through the team cell phone.  Should this patient require assistance outside of these hours, please call the patient's attending physician.

## 2020-07-11 NOTE — Discharge Summary (Signed)
Physician Discharge Summary  Margaret Hernandez WUJ:[id number] DOB: July 05, 1929 DOA: 07/08/2020  PCP: [facility]  Admit date: 07/08/2020 Discharge date: 07/11/2020  Admitted From: Home Disposition: SNF  Recommendations for Outpatient Follow-up:  1. Follow up with SNF provider at earliest convenience 2. Outpatient follow-up with neurosurgery/Dr. Franky Macho 3. Outpatient evaluation and follow-up by palliative care if condition were to worsen 4. Follow up in ED if symptoms worsen or new appear   Home Health: No Equipment/Devices: None  Discharge Condition: Guarded CODE STATUS: DNR Diet recommendation: Heart healthy  Brief/Interim Summary: 87 year old female with history of seizure disorder, hypertension, dementia, diabetes mellitus type 2 presented with worsening weakness and recent falls and decreased appetite.  On presentation, CT of the head was unremarkable.  CT of the lumbar spine showed probable subacute compression fracture of L2 with no significant osseous retropulsion along with degenerative disc changes at L4-L5 with moderate canal stenosis and moderate right foraminal stenosis.  Creatinine was 1.43.  She was started on IV fluids and MRI of lumbar spine was ordered.  Neurosurgery recommended bracing when out of bed and outpatient follow-up.  Renal function has improved with IV fluids and she is off IV fluids currently.  PT/OT recommended SNF.  She will be discharged to SNF once bed is available.  Discharge Diagnoses:   Acute kidney injury -Baseline creatinine of 0.8.  Presented with creatinine of 1.43.  Creatinine has improved; 1.08 this morning.  Off IV fluids.  Acute/subacute L2 fracture Generalized weakness and recurrent falls -CT of L-spine: "Multilevel degenerative changes with disc space narrowing, endplate osteophytes, and facet hypertrophy with ligamentum flavum thickening. These changes are greatest at L4-L5 where there is moderate canal  stenosis and moderate right foraminal Stenosis."  MRI of lumbar spine showed acute to subacute L2 fracture.  -Neurosurgery evaluation appreciated: Neurosurgery recommends bracing when out of bed with outpatient follow-up in 6 weeks -No obvious neuro deficits on exam.  PT/OT recommend SNF placement.    Hypokalemia -Replaced and improved.  Chronic microcytic anemia -Hemoglobin stable.  No signs of bleeding.  Outpatient follow-up  Hypomagnesemia -Improved  Hypertension -Monitor blood pressure.  Losartan/hydrochlorthiazide on hold because of renal injury.  If blood pressure remains an issue, these probably can be restarted as an outpatient.  Seizure -Continue Keppra and lacosamide.  Outpatient follow-up with neurology  Diabetes mellitus type 2 -A1c 6.4.  Carb modified diet.  Outpatient follow-up  Generalized conditioning -Palliative care has evaluated the patient.  CODE STATUS has been changed to DNR.  If condition were to worsen, recommend outpatient palliative care evaluation and follow-up.   Discharge Instructions  Discharge Instructions    Diet - low sodium heart healthy   Complete by: As directed    Increase activity slowly   Complete by: As directed      Allergies as of 07/11/2020   No Known Allergies     Medication List    STOP taking these medications   losartan-hydrochlorothiazide 100-25 MG tablet Commonly known as: HYZAAR     TAKE these medications   acetaminophen 325 MG tablet Commonly known as: TYLENOL Take 650 mg by mouth every 6 (six) hours as needed for mild pain or headache.   amLODipine 5 MG tablet Commonly known as: NORVASC Take 5 mg by mouth daily.   atorvastatin 80 MG tablet Commonly known as: LIPITOR Take 80 mg by mouth daily.   calcium citrate 950 (200 Ca) MG tablet Commonly known as: CALCITRATE - dosed in mg elemental  calcium Take 200 mg of elemental calcium by mouth daily.   cholecalciferol 25 MCG (1000 UNIT) tablet Commonly  known as: VITAMIN D3 Take 1,000 Units by mouth daily.   donepezil 10 MG tablet Commonly known as: ARICEPT Take 10 mg by mouth at bedtime.   fexofenadine 180 MG tablet Commonly known as: ALLEGRA Take 180 mg by mouth daily.   Lacosamide 150 MG Tabs Take 1 tablet (150 mg total) by mouth 2 (two) times daily.   levETIRAcetam 750 MG tablet Commonly known as: KEPPRA Take 1 tablet (750 mg total) by mouth 2 (two) times daily. What changed:   medication strength  how much to take   memantine 10 MG tablet Commonly known as: NAMENDA Take 10 mg by mouth 2 (two) times daily.   senna 8.6 MG Tabs tablet Commonly known as: SENOKOT Take 1 tablet by mouth 2 (two) times daily.       Contact information for follow-up providers    Coletta Memos, MD. Schedule an appointment as soon as possible for a visit in 1 week(s).   Specialty: Neurosurgery Contact information: [address] [place] [address] [phone]            Contact information for after-discharge care    Destination    [facility] Preferred SNF .   Service: Skilled Nursing Contact information: [address] [place] [address] [phone]                 No Known Allergies  Consultations:  Neurosurgery/palliative care   Procedures/Studies: DG THORACOLUMABAR SPINE  Result Date: 07/02/2020 CLINICAL DATA:  Back pain, fall EXAM: THORACOLUMBAR SPINE 1V COMPARISON:  None. FINDINGS: No fracture or subluxation. Degenerative disc disease most notable at L4-5 with disc space narrowing and vacuum disc. Degenerative facet disease in the lower lumbar spine. Aortic atherosclerosis. No aneurysm. IMPRESSION: Degenerative changes.  No acute bony abnormality. Electronically Signed   By: Charlett Nose M.D.   On: 07/02/2020 10:03   DG Shoulder Right  Result Date: 07/08/2020 CLINICAL DATA:  Proximal humerus pain after fall EXAM: RIGHT SHOULDER - 2+ VIEW  COMPARISON:  Chest radiograph July 18, 2018. FINDINGS: There is no evidence of fracture or dislocation. Mild elevation of the humeral head relation to the glenoid. Mild AC joint arthropathy. Productive change along the greater tubercle. Soft tissues are unremarkable. Visualized lung field is clear. IMPRESSION: 1. No acute osseous abnormality. 2. Mild elevation of the humeral head relation to the glenoid which may indicate rotator cuff pathology. Electronically Signed   By: Maudry Mayhew MD   On: 07/08/2020 16:37   CT Head Wo Contrast  Result Date: 07/08/2020 CLINICAL DATA:  Frequent falls EXAM: CT HEAD WITHOUT CONTRAST TECHNIQUE: Contiguous axial images were obtained from the base of the skull through the vertex without intravenous contrast. COMPARISON:  May 2015 FINDINGS: Brain: There is no acute intracranial hemorrhage, mass effect, or edema. Gray-white differentiation is preserved. There is no extra-axial fluid collection. Ventricles and sulci are stable in size and configuration. Patchy areas of hypoattenuation supratentorial white are nonspecific but may reflect stable mild to moderate microvascular ischemic changes. Vascular: There is atherosclerotic calcification at the skull base. Skull: Calvarium is unremarkable. Sinuses/Orbits: No acute finding. Other: None. IMPRESSION: No acute intracranial abnormality. Electronically Signed   By: Guadlupe Spanish M.D.   On: 07/08/2020 15:51   CT Lumbar Spine Wo Contrast  Result Date: 07/08/2020 CLINICAL DATA:  Low back pain after frequent falls  EXAM: CT LUMBAR SPINE WITHOUT CONTRAST TECHNIQUE: Multidetector CT imaging of the lumbar spine was performed without intravenous contrast administration. Multiplanar CT image reconstructions were also generated. COMPARISON:  None. FINDINGS: Segmentation: 5 lumbar type vertebrae. Alignment: Levocurvature.  Anteroposterior alignment is maintained. Vertebrae: There is compression fracture of L2 with mild loss of height at  the superior endplate. No significant osseous retropulsion. No other fracture identified. Paraspinal and other soft tissues: Aortic atherosclerosis. Left renal atrophy. Colonic diverticulosis. Disc levels: Multilevel degenerative changes with disc space narrowing, endplate osteophytes, and facet hypertrophy with ligamentum flavum thickening. These changes are greatest at L4-L5 where there is moderate canal stenosis and moderate right foraminal stenosis. IMPRESSION: L2 compression fracture with mild loss of height. Age-indeterminate but favor at least subacute. Electronically Signed   By: Guadlupe SpanishPraneil  Patel M.D.   On: 07/08/2020 15:43   MR LUMBAR SPINE WO CONTRAST  Result Date: 07/08/2020 CLINICAL DATA:  Initial evaluation for acute low back pain. EXAM: MRI LUMBAR SPINE WITHOUT CONTRAST TECHNIQUE: Multiplanar, multisequence MR imaging of the lumbar spine was performed. No intravenous contrast was administered. COMPARISON:  Prior CT from earlier the same day. FINDINGS: Segmentation: Standard. Lowest well-formed disc space labeled the L5-S1 level. Alignment: Mild levoscoliosis. Alignment otherwise normal with preservation of the normal lumbar lordosis. No listhesis. Vertebrae: Compression deformity seen involving the superior endplate of L2 with no more than mild 10-20% height loss without bony retropulsion. There is persistent marrow edema about this compression fracture, suggesting that this is acute to subacute in nature. This is benign/mechanical in appearance, with no underlying pathologic features. Trace 2 mm bony retropulsion. Otherwise, vertebral body height maintained. Underlying bone marrow signal intensity within normal limits. No discrete or worrisome osseous lesions. No other abnormal marrow edema. Conus medullaris and cauda equina: Conus extends to the L1-2 level. Conus and cauda equina appear normal. Paraspinal and other soft tissues: Paraspinous soft tissues within normal limits. Left kidney is markedly  atrophic with multiple scattered cysts. Dilatation of the common bile duct noted, likely related to post cholecystectomy changes and advanced age. Disc levels: T11-12: Mild disc bulge with left greater than right facet hypertrophy. No spinal stenosis. Mild left foraminal narrowing. T12-L1: Mild diffuse disc bulge.  No canal or foraminal stenosis. L1-2: Disc desiccation with minimal disc bulge trace 2 mm bony retropulsion related to the L2 compression fracture. Mild facet hypertrophy. No significant spinal stenosis. Foramina remain patent. L2-3: Disc desiccation with minimal annular disc bulge. Mild facet and ligament flavum hypertrophy. No canal or foraminal stenosis. L3-4: Disc desiccation with mild far lateral disc bulging. Mild facet and ligament flavum hypertrophy. No significant spinal stenosis. Mild bilateral foraminal narrowing. No impingement. L4-5: Degenerative intervertebral disc space narrowing with diffuse disc bulge and disc desiccation. Superimposed broad-based right extraforaminal disc protrusion contacts the exiting right L4 nerve root as it courses of the right neural foramen (series 7, image 31). Superimposed right-sided reactive endplate change. Mild to moderate facet and ligament flavum hypertrophy. Trace joint effusion on the left. Resultant moderate canal with right greater than left lateral recess stenosis. Moderate right with mild left L4 foraminal narrowing. L5-S1: Negative interspace. Moderate left worse than right facet hypertrophy. No significant spinal stenosis. Foramina remain patent. IMPRESSION: 1. Acute to subacute compression fracture involving the superior endplate of L2 with no more than mild 10-20% height loss with trace 2 mm bony retropulsion. No stenosis. 2. Degenerative disc bulge with facet hypertrophy at L4-5 with resultant moderate canal and right greater than left lateral recess stenosis,  with moderate right L4 foraminal narrowing. 3. Additional mild for age noncompressive  disc bulging elsewhere within the lumbar spine without significant stenosis or neural impingement. Electronically Signed   By: Rise Mu M.D.   On: 07/08/2020 20:45       Subjective: Patient seen and examined at bedside.  Extremely poor historian.  No overnight fever, vomiting, worsening shortness of breath reported.  Discharge Exam: Vitals:   07/10/20 2031 07/11/20 0453  BP: (!) 142/67 140/69  Pulse: 73 74  Resp:  17  Temp: 99 F (37.2 C) 98.9 F (37.2 C)  SpO2: 99% 99%     General exam: No distress.  Elderly female lying in bed.  Chronically ill looking.  Poor historian.  On room air currently.  Awake, does not participate in conversation much Respiratory system: Bilateral decreased breath sounds at bases, no wheezing cardiovascular system: S1-S2 heard, rate controlled gastrointestinal system: Abdomen is nondistended, soft and nontender.  Normal bowel sounds are heard  extremities: No cyanosis, clubbing or edema    The results of significant diagnostics from this hospitalization (including imaging, microbiology, ancillary and laboratory) are listed below for reference.     Microbiology: Recent Results (from the past 240 hour(s))  Resp Panel by RT-PCR (Flu A&B, Covid) Nasopharyngeal Swab     Status: None   Collection Time: 07/08/20  6:56 PM   Specimen: Nasopharyngeal Swab; Nasopharyngeal(NP) swabs in vial transport medium  Result Value Ref Range Status   SARS Coronavirus 2 by RT PCR NEGATIVE NEGATIVE Final    Comment: (NOTE) SARS-CoV-2 target nucleic acids are NOT DETECTED.  The SARS-CoV-2 RNA is generally detectable in upper respiratory specimens during the acute phase of infection. The lowest concentration of SARS-CoV-2 viral copies this assay can detect is 138 copies/mL. A negative result does not preclude SARS-Cov-2 infection and should not be used as the sole basis for treatment or other patient management decisions. A negative result may occur with   improper specimen collection/handling, submission of specimen other than nasopharyngeal swab, presence of viral mutation(s) within the areas targeted by this assay, and inadequate number of viral copies(<138 copies/mL). A negative result must be combined with clinical observations, patient history, and epidemiological information. The expected result is Negative.  Fact Sheet for Patients:  BloggerCourse.com  Fact Sheet for Healthcare Providers:  SeriousBroker.it  This test is no t yet approved or cleared by the [place] FDA and  has been authorized for detection and/or diagnosis of SARS-CoV-2 by FDA under an Emergency Use Authorization (EUA). This EUA will remain  in effect (meaning this test can be used) for the duration of the COVID-19 declaration under Section 564(b)(1) of the Act, 21 U.S.C.section 360bbb-3(b)(1), unless the authorization is terminated  or revoked sooner.       Influenza A by PCR NEGATIVE NEGATIVE Final   Influenza B by PCR NEGATIVE NEGATIVE Final    Comment: (NOTE) The Xpert Xpress SARS-CoV-2/FLU/RSV plus assay is intended as an aid in the diagnosis of influenza from Nasopharyngeal swab specimens and should not be used as a sole basis for treatment. Nasal washings and aspirates are unacceptable for Xpert Xpress SARS-CoV-2/FLU/RSV testing.  Fact Sheet for Patients: BloggerCourse.com  Fact Sheet for Healthcare Providers: SeriousBroker.it  This test is not yet approved or cleared by the [place] FDA and has been authorized for detection and/or diagnosis of SARS-CoV-2 by FDA under an Emergency Use Authorization (EUA). This EUA will remain in effect (meaning this test can be used) for the duration of  the COVID-19 declaration under Section 564(b)(1) of the Act, 21 U.S.C. section 360bbb-3(b)(1), unless the authorization is terminated  or revoked.  Performed at [facility], [address], [place], [place] [address]      Labs: BNP (last 3 results) No results for input(s): BNP in the last 8760 hours. Basic Metabolic Panel: Recent Labs  Lab 07/08/20 1113 07/08/20 1644 07/09/20 0400 07/10/20 0042 07/11/20 0041  NA 139  --  139 136 137  K 3.0*  --  3.3* 3.4* 3.6  CL 98  --  99 100 102  CO2 28  --  30 24 27   GLUCOSE 117*  --  149* 100* 122*  BUN 23  --  19 12 14   CREATININE 1.43*  --  1.04* 0.82 1.08*  CALCIUM 10.2  --  9.6 9.0 8.5*  MG  --  1.6* 1.4* 1.7 1.7   Liver Function Tests: Recent Labs  Lab 07/08/20 1644  AST 20  ALT 11  ALKPHOS 66  BILITOT 0.8  PROT 6.3*  ALBUMIN 3.3*   No results for input(s): LIPASE, AMYLASE in the last 168 hours. Recent Labs  Lab 07/08/20 1644  AMMONIA 11   CBC: Recent Labs  Lab 07/08/20 1113 07/09/20 0400 07/10/20 0042 07/11/20 0041  WBC 8.3 8.3 7.3 7.1  NEUTROABS  --  5.7 4.4 3.4  HGB 11.0* 10.0* 10.3* 9.3*  HCT 35.0* 32.7* 30.9* 29.4*  MCV 79.2* 78.8* 76.7* 77.8*  PLT 241 247 219 230   Cardiac Enzymes: No results for input(s): CKTOTAL, CKMB, CKMBINDEX, TROPONINI in the last 168 hours. BNP: Invalid input(s): POCBNP CBG: Recent Labs  Lab 07/10/20 1228 07/10/20 1818 07/10/20 2033 07/11/20 0726 07/11/20 1130  GLUCAP 125* 112* 161* 96 129*   D-Dimer No results for input(s): DDIMER in the last 72 hours. Hgb A1c No results for input(s): HGBA1C in the last 72 hours. Lipid Profile No results for input(s): CHOL, HDL, LDLCALC, TRIG, CHOLHDL, LDLDIRECT in the last 72 hours. Thyroid function studies Recent Labs    07/08/20 1644  TSH 1.020   Anemia work up Recent Labs    07/08/20 1644  VITAMINB12 390  FOLATE 8.0  FERRITIN 376*  TIBC 221*  IRON 24*   Urinalysis    Component Value Date/Time   COLORURINE YELLOW 07/08/2020 1644   APPEARANCEUR CLEAR 07/08/2020 1644   APPEARANCEUR Hazy 02/25/2013 0622   LABSPEC 1.016 07/08/2020 1644    LABSPEC 1.018 02/25/2013 0622   PHURINE 5.0 07/08/2020 1644   GLUCOSEU NEGATIVE 07/08/2020 1644   GLUCOSEU 150 mg/dL 09/05/2020 09/05/2020   HGBUR SMALL (A) 07/08/2020 1644   BILIRUBINUR NEGATIVE 07/08/2020 1644   BILIRUBINUR Negative 02/25/2013 0622   KETONESUR NEGATIVE 07/08/2020 1644   PROTEINUR NEGATIVE 07/08/2020 1644   UROBILINOGEN 0.2 04/23/2014 1605   NITRITE NEGATIVE 07/08/2020 1644   LEUKOCYTESUR NEGATIVE 07/08/2020 1644   LEUKOCYTESUR Trace 02/25/2013 0622   Sepsis Labs Invalid input(s): PROCALCITONIN,  WBC,  LACTICIDVEN Microbiology Recent Results (from the past 240 hour(s))  Resp Panel by RT-PCR (Flu A&B, Covid) Nasopharyngeal Swab     Status: None   Collection Time: 07/08/20  6:56 PM   Specimen: Nasopharyngeal Swab; Nasopharyngeal(NP) swabs in vial transport medium  Result Value Ref Range Status   SARS Coronavirus 2 by RT PCR NEGATIVE NEGATIVE Final    Comment: (NOTE) SARS-CoV-2 target nucleic acids are NOT DETECTED.  The SARS-CoV-2 RNA is generally detectable in upper respiratory specimens during the acute phase of infection. The lowest concentration of  SARS-CoV-2 viral copies this assay can detect is 138 copies/mL. A negative result does not preclude SARS-Cov-2 infection and should not be used as the sole basis for treatment or other patient management decisions. A negative result may occur with  improper specimen collection/handling, submission of specimen other than nasopharyngeal swab, presence of viral mutation(s) within the areas targeted by this assay, and inadequate number of viral copies(<138 copies/mL). A negative result must be combined with clinical observations, patient history, and epidemiological information. The expected result is Negative.  Fact Sheet for Patients:  BloggerCourse.com  Fact Sheet for Healthcare Providers:  SeriousBroker.it  This test is no t yet approved or cleared by the [place] FDA and  has been authorized for detection and/or diagnosis of SARS-CoV-2 by FDA under an Emergency Use Authorization (EUA). This EUA will remain  in effect (meaning this test can be used) for the duration of the COVID-19 declaration under Section 564(b)(1) of the Act, 21 U.S.C.section 360bbb-3(b)(1), unless the authorization is terminated  or revoked sooner.       Influenza A by PCR NEGATIVE NEGATIVE Final   Influenza B by PCR NEGATIVE NEGATIVE Final    Comment: (NOTE) The Xpert Xpress SARS-CoV-2/FLU/RSV plus assay is intended as an aid in the diagnosis of influenza from Nasopharyngeal swab specimens and should not be used as a sole basis for treatment. Nasal washings and aspirates are unacceptable for Xpert Xpress SARS-CoV-2/FLU/RSV testing.  Fact Sheet for Patients: BloggerCourse.com  Fact Sheet for Healthcare Providers: SeriousBroker.it  This test is not yet approved or cleared by the [place] FDA and has been authorized for detection and/or diagnosis of SARS-CoV-2 by FDA under an Emergency Use Authorization (EUA). This EUA will remain in effect (meaning this test can be used) for the duration of the COVID-19 declaration under Section 564(b)(1) of the Act, 21 U.S.C. section 360bbb-3(b)(1), unless the authorization is terminated or revoked.  Performed at [facility], [address] [place], [place] [address]      Time coordinating discharge: 35 minutes  SIGNED:   Glade Lloyd, MD  [facility] 07/11/2020, 12:51 PM

## 2020-12-11 ENCOUNTER — Emergency Department (HOSPITAL_COMMUNITY): Payer: Medicare (Managed Care)

## 2020-12-11 ENCOUNTER — Ambulatory Visit
Admission: RE | Admit: 2020-12-11 | Discharge: 2020-12-11 | Disposition: A | Discharge status: Home / Self Care | Payer: Medicare (Managed Care) | Source: Ambulatory Visit | Attending: Nurse Practitioner | Admitting: Nurse Practitioner

## 2020-12-11 ENCOUNTER — Other Ambulatory Visit: Payer: Self-pay

## 2020-12-11 ENCOUNTER — Inpatient Hospital Stay (HOSPITAL_COMMUNITY)
Admission: EM | Admit: 2020-12-11 | Discharge: 2020-12-17 | DRG: 177 | Disposition: A | Discharge status: Skilled Nursing Facility | Payer: Medicare (Managed Care) | Attending: Internal Medicine | Admitting: Internal Medicine

## 2020-12-11 ENCOUNTER — Other Ambulatory Visit: Payer: Self-pay | Admitting: Nurse Practitioner

## 2020-12-11 DIAGNOSIS — Z6821 Body mass index (BMI) 21.0-21.9, adult: Secondary | ICD-10-CM

## 2020-12-11 DIAGNOSIS — R079 Chest pain, unspecified: Secondary | ICD-10-CM

## 2020-12-11 DIAGNOSIS — R059 Cough, unspecified: Secondary | ICD-10-CM

## 2020-12-11 DIAGNOSIS — E43 Unspecified severe protein-calorie malnutrition: Secondary | ICD-10-CM | POA: Diagnosis present

## 2020-12-11 DIAGNOSIS — E1169 Type 2 diabetes mellitus with other specified complication: Secondary | ICD-10-CM | POA: Diagnosis present

## 2020-12-11 DIAGNOSIS — Z79899 Other long term (current) drug therapy: Secondary | ICD-10-CM

## 2020-12-11 DIAGNOSIS — F0391 Unspecified dementia with behavioral disturbance: Secondary | ICD-10-CM | POA: Diagnosis present

## 2020-12-11 DIAGNOSIS — R0989 Other specified symptoms and signs involving the circulatory and respiratory systems: Secondary | ICD-10-CM

## 2020-12-11 DIAGNOSIS — F0390 Unspecified dementia without behavioral disturbance: Secondary | ICD-10-CM

## 2020-12-11 DIAGNOSIS — F03918 Unspecified dementia, unspecified severity, with other behavioral disturbance: Secondary | ICD-10-CM | POA: Diagnosis present

## 2020-12-11 DIAGNOSIS — Z66 Do not resuscitate: Secondary | ICD-10-CM | POA: Diagnosis present

## 2020-12-11 DIAGNOSIS — U071 COVID-19: Secondary | ICD-10-CM | POA: Diagnosis not present

## 2020-12-11 DIAGNOSIS — R6251 Failure to thrive (child): Secondary | ICD-10-CM

## 2020-12-11 DIAGNOSIS — E785 Hyperlipidemia, unspecified: Secondary | ICD-10-CM | POA: Diagnosis present

## 2020-12-11 DIAGNOSIS — R410 Disorientation, unspecified: Secondary | ICD-10-CM

## 2020-12-11 DIAGNOSIS — E869 Volume depletion, unspecified: Secondary | ICD-10-CM | POA: Diagnosis present

## 2020-12-11 DIAGNOSIS — R54 Age-related physical debility: Secondary | ICD-10-CM | POA: Diagnosis present

## 2020-12-11 DIAGNOSIS — I10 Essential (primary) hypertension: Secondary | ICD-10-CM | POA: Diagnosis present

## 2020-12-11 DIAGNOSIS — G40909 Epilepsy, unspecified, not intractable, without status epilepticus: Secondary | ICD-10-CM

## 2020-12-11 DIAGNOSIS — R64 Cachexia: Secondary | ICD-10-CM | POA: Diagnosis present

## 2020-12-11 DIAGNOSIS — Z8249 Family history of ischemic heart disease and other diseases of the circulatory system: Secondary | ICD-10-CM

## 2020-12-11 DIAGNOSIS — G9341 Metabolic encephalopathy: Secondary | ICD-10-CM | POA: Diagnosis present

## 2020-12-11 DIAGNOSIS — K219 Gastro-esophageal reflux disease without esophagitis: Secondary | ICD-10-CM | POA: Diagnosis present

## 2020-12-11 DIAGNOSIS — R0902 Hypoxemia: Secondary | ICD-10-CM | POA: Diagnosis present

## 2020-12-11 DIAGNOSIS — R296 Repeated falls: Secondary | ICD-10-CM | POA: Diagnosis present

## 2020-12-11 NOTE — ED Provider Notes (Signed)
[facility] Note   CSN: [id number] Arrival date & time: 12/11/20  1550     History Chief Complaint  Patient presents with   Altered Mental Status   Covid Positive    Margaret Hernandez is a 87 y.o. female.  Margaret Hernandez is 87 years old with a history of diabetes, hypertension, seizure disorder, and dementia.  She typically lives with her daughter and is able to feed herself and ambulate.  She started to get sick roughly 1 week ago, and she has had a severe cough, weakness, and multiple falls.  Today she was seen at [facility] and tested positive for COVID-19.  She was also noted to be transiently hypoxic at the clinic, and EMS was called.  However, when EMS arrived, they recorded a normal pulse ox reading.  The history is provided by the patient, the EMS personnel and a relative (I spoke with her daughter). The history is limited by the condition of the patient (history of dementia, appears lethargic).  Cough Cough characteristics:  Unable to specify Severity:  Moderate Onset quality:  Gradual Duration:  1 week Timing:  Constant Progression:  Worsening Chronicity:  New Context comment:  Diagnosed with COVID-19 today Relieved by:  Nothing Worsened by:  Nothing Ineffective treatments: mucinex. Associated symptoms: no chest pain, no chills, no ear pain, no fever, no rash, no shortness of breath and no sore throat       Past Medical History:  Diagnosis Date   Dementia (HCC)    Diabetes mellitus without complication (HCC)    Hypertension     Patient Active Problem List   Diagnosis Date Noted   AKI (acute kidney injury) (HCC) 07/08/2020   Physical deconditioning 07/08/2020   Lumbar stenosis 07/08/2020   Hypokalemia 07/08/2020   Microcytic anemia 07/08/2020   Awareness alteration, transient 08/28/2014   Generalized seizure disorder (HCC) 08/28/2014   Fall due to seizure [facility])    Seizure (HCC)    Status epilepticus (HCC) 04/23/2014    Status epilepticus, generalized convulsive (HCC) 04/23/2014   Diabetes (HCC) 12/24/2013   Diabetes type 2, uncontrolled (HCC) 12/24/2013   Dementia with behavioral disturbance (HCC) 12/24/2013   Arthritis 10/23/2010   HBP (high blood pressure) 10/23/2010    Past Surgical History:  Procedure Laterality Date   ABDOMINAL HYSTERECTOMY     SHOULDER SURGERY       OB History   No obstetric history on file.     Family History  Problem Relation Age of Onset   Hypertension Mother    Hypertension Father     Social History   Tobacco Use   Smoking status: Never   Smokeless tobacco: Never  Vaping Use   Vaping Use: Never used  Substance Use Topics   Alcohol use: No   Drug use: No    Home Medications Prior to Admission medications   Medication Sig Start Date End Date Taking? Authorizing Provider  acetaminophen (TYLENOL) 325 MG tablet Take 650 mg by mouth every 6 (six) hours as needed for mild pain or headache.    [provider]  amLODipine (NORVASC) 5 MG tablet Take 5 mg by mouth daily.    [provider]  atorvastatin (LIPITOR) 80 MG tablet Take 80 mg by mouth daily.    [provider]  calcium citrate (CALCITRATE - DOSED IN MG ELEMENTAL CALCIUM) 950 MG tablet Take 200 mg of elemental calcium by mouth daily.    [provider]  cholecalciferol (VITAMIN  D3) 25 MCG (1000 UNIT) tablet Take 1,000 Units by mouth daily.    [provider]  donepezil (ARICEPT) 10 MG tablet Take 10 mg by mouth at bedtime.    [provider]  fexofenadine (ALLEGRA) 180 MG tablet Take 180 mg by mouth daily.    [provider]  lacosamide 150 MG TABS Take 1 tablet (150 mg total) by mouth 2 (two) times daily. 07/11/20   Glade Lloyd, MD  levETIRAcetam (KEPPRA) 750 MG tablet Take 1 tablet (750 mg total) by mouth 2 (two) times daily. 07/11/20   Glade Lloyd, MD  memantine (NAMENDA) 10 MG tablet Take 10 mg by mouth 2 (two) times daily.    [provider]  senna (SENOKOT) 8.6 MG TABS tablet Take 1 tablet by mouth 2 (two) times daily.    [provider]    Allergies    Patient has no known allergies.  Review of Systems   Review of Systems  Constitutional:  Negative for chills and fever.  HENT:  Negative for ear pain and sore throat.   Eyes:  Negative for pain and visual disturbance.  Respiratory:  Positive for cough. Negative for shortness of breath.   Cardiovascular:  Negative for chest pain and palpitations.  Gastrointestinal:  Negative for abdominal pain and vomiting.  Genitourinary:  Negative for dysuria and hematuria.  Musculoskeletal:  Negative for arthralgias and back pain.  Skin:  Negative for color change and rash.  Neurological:  Positive for weakness. Negative for seizures and syncope.       Multiple falls  All other systems reviewed and are negative.  Physical Exam Updated Vital Signs BP (!) 155/72 (BP Location: Right Arm)   Pulse 79   Temp 98.6 F (37 C) (Oral)   Resp 20   SpO2 98%   Physical Exam Constitutional:      Appearance: She is ill-appearing.  HENT:     Head: Normocephalic and atraumatic.  Cardiovascular:     Rate and Rhythm: Normal rate and regular rhythm.  Pulmonary:     Effort: Pulmonary effort is normal.     Breath sounds: Normal breath sounds.     Comments: Frequent cough Abdominal:     General: There is no distension.     Tenderness: There is no abdominal tenderness. There is no guarding.  Musculoskeletal:        General: No deformity or signs of injury.     Right lower leg: No edema.     Left lower leg: No edema.  Skin:    General: Skin is warm and dry.  Neurological:     Mental Status: She is disoriented.     Comments: Lying on her side with her eyes closed. Able to answer questions but appears very tired. Able to move all extremities.  Psychiatric:        Behavior: Behavior normal.    ED Results / Procedures / Treatments   Labs (all labs ordered are  listed, but only abnormal results are displayed) Labs Reviewed  COMPREHENSIVE METABOLIC PANEL  BRAIN NATRIURETIC PEPTIDE  CBC WITH DIFFERENTIAL/PLATELET  D-DIMER, QUANTITATIVE  LACTATE DEHYDROGENASE  FERRITIN  SEDIMENTATION RATE  C-REACTIVE PROTEIN  PROCALCITONIN  TROPONIN I (HIGH SENSITIVITY)    EKG None  Radiology CT Head Wo Contrast  Result Date: 12/11/2020 CLINICAL DATA:  Mental status change. EXAM: CT HEAD WITHOUT CONTRAST TECHNIQUE: Contiguous axial images were obtained from the base of the skull through the vertex without intravenous contrast. COMPARISON:  July 08, 2020 FINDINGS: Brain: Similar mild age related global parenchymal volume loss with ex vacuo dilatation of the ventricular system. Stable burden of mild-to-moderate chronic ischemic microvascular white matter disease. Mineralization of bilateral basal ganglia. No evidence of acute infarction, hemorrhage, hydrocephalus, extra-axial collection or mass lesion/mass effect. Vascular: No hyperdense vessel. Atherosclerotic calcifications of the internal carotid arteries at the skull base. Skull: Hyperostosis frontalis. Negative for fracture or focal lesion. Sinuses/Orbits: The visualized portions of the paranasal sinuses and mastoid air cells are predominantly clear. Orbits are grossly unremarkable. Other: None IMPRESSION: 1. No acute intracranial findings. 2. Stable age related global parenchymal volume loss and chronic ischemic microvascular white matter disease. Electronically Signed   By: Maudry Mayhew MD   On: 12/11/2020 19:52    Procedures Procedures   Medications Ordered in ED Medications - No data to display  ED Course  I have reviewed the triage vital signs and the nursing notes.  Pertinent labs & imaging results that were available during my care of the patient were reviewed by me and considered in my medical decision making (see chart for details).    MDM Rules/Calculators/A&P                           MISTEE SOLIMAN is 87 years old and suffers from dementia.  She was diagnosed with COVID-19 today.  She has been confused, weak, and falling.  She is currently awaiting labs, and admission will likely be indicated.  Unfortunately, she had a delay in her ED treatment secondary to difficult IV access. Final Clinical Impression(s) / ED Diagnoses Final diagnoses:  COVID-19  Dementia without behavioral disturbance, unspecified dementia type (HCC)  Confusion    Rx / DC Orders ED Discharge Orders     None        Koleen Distance, MD 12/12/20 [phone]

## 2020-12-11 NOTE — ED Notes (Signed)
Margaret Hernandez [phone] Patients daughter would like an update

## 2020-12-11 NOTE — ED Triage Notes (Signed)
Patient BIB [facility] from [facility] for SOB and decreased O2 sats *80%  EMS reports patient wen to doctors office this am for SOB and cough. EMS reports that staff stated patient had mental status changes while in their care. Baseline orientation is not know. Patient is A&O x1

## 2020-12-11 NOTE — ED Notes (Signed)
Patient is a very difficult stick. Multiple RNs have tried with no success. IV team consult placed at 1900. 2nd IV consult order placed 2130. MD notified of situation as well as Consulting civil engineer

## 2020-12-11 NOTE — ED Notes (Signed)
Contacted [facility]. They stated that they are a"senoir day care" center. Patient's go home after 5p

## 2020-12-11 NOTE — ED Notes (Signed)
Patient refused to let me place a Pure Wick. I explained to her twice what it does and she refused both times.

## 2020-12-12 ENCOUNTER — Encounter (HOSPITAL_COMMUNITY): Payer: Self-pay | Admitting: Internal Medicine

## 2020-12-12 DIAGNOSIS — Z6821 Body mass index (BMI) 21.0-21.9, adult: Secondary | ICD-10-CM | POA: Diagnosis not present

## 2020-12-12 DIAGNOSIS — E43 Unspecified severe protein-calorie malnutrition: Secondary | ICD-10-CM | POA: Diagnosis present

## 2020-12-12 DIAGNOSIS — I10 Essential (primary) hypertension: Secondary | ICD-10-CM | POA: Diagnosis present

## 2020-12-12 DIAGNOSIS — Z66 Do not resuscitate: Secondary | ICD-10-CM | POA: Diagnosis present

## 2020-12-12 DIAGNOSIS — G9341 Metabolic encephalopathy: Secondary | ICD-10-CM | POA: Diagnosis present

## 2020-12-12 DIAGNOSIS — K219 Gastro-esophageal reflux disease without esophagitis: Secondary | ICD-10-CM | POA: Diagnosis present

## 2020-12-12 DIAGNOSIS — E869 Volume depletion, unspecified: Secondary | ICD-10-CM | POA: Diagnosis present

## 2020-12-12 DIAGNOSIS — F0391 Unspecified dementia with behavioral disturbance: Secondary | ICD-10-CM | POA: Diagnosis present

## 2020-12-12 DIAGNOSIS — R64 Cachexia: Secondary | ICD-10-CM | POA: Diagnosis present

## 2020-12-12 DIAGNOSIS — E1169 Type 2 diabetes mellitus with other specified complication: Secondary | ICD-10-CM | POA: Diagnosis present

## 2020-12-12 DIAGNOSIS — E785 Hyperlipidemia, unspecified: Secondary | ICD-10-CM | POA: Diagnosis present

## 2020-12-12 DIAGNOSIS — Z8249 Family history of ischemic heart disease and other diseases of the circulatory system: Secondary | ICD-10-CM | POA: Diagnosis not present

## 2020-12-12 DIAGNOSIS — U071 COVID-19: Secondary | ICD-10-CM | POA: Diagnosis present

## 2020-12-12 DIAGNOSIS — R54 Age-related physical debility: Secondary | ICD-10-CM | POA: Diagnosis present

## 2020-12-12 DIAGNOSIS — R0902 Hypoxemia: Secondary | ICD-10-CM | POA: Diagnosis present

## 2020-12-12 DIAGNOSIS — Z79899 Other long term (current) drug therapy: Secondary | ICD-10-CM | POA: Diagnosis not present

## 2020-12-12 DIAGNOSIS — R059 Cough, unspecified: Secondary | ICD-10-CM | POA: Diagnosis present

## 2020-12-12 DIAGNOSIS — G40909 Epilepsy, unspecified, not intractable, without status epilepticus: Secondary | ICD-10-CM

## 2020-12-12 DIAGNOSIS — E782 Mixed hyperlipidemia: Secondary | ICD-10-CM

## 2020-12-12 DIAGNOSIS — R296 Repeated falls: Secondary | ICD-10-CM | POA: Diagnosis present

## 2020-12-12 LAB — RESP PANEL BY RT-PCR (FLU A&B, COVID) ARPGX2
Influenza A by PCR: NEGATIVE
Influenza B by PCR: NEGATIVE
SARS Coronavirus 2 by RT PCR: POSITIVE — AB

## 2020-12-12 LAB — CBC WITH DIFFERENTIAL/PLATELET
Abs Immature Granulocytes: 0.02 10*3/uL (ref 0.00–0.07)
Abs Immature Granulocytes: 0.04 10*3/uL (ref 0.00–0.07)
Basophils Absolute: 0 10*3/uL (ref 0.0–0.1)
Basophils Absolute: 0 10*3/uL (ref 0.0–0.1)
Basophils Relative: 0 %
Basophils Relative: 0 %
Eosinophils Absolute: 0 10*3/uL (ref 0.0–0.5)
Eosinophils Absolute: 0 10*3/uL (ref 0.0–0.5)
Eosinophils Relative: 0 %
Eosinophils Relative: 0 %
HCT: 33.7 % — ABNORMAL LOW (ref 36.0–46.0)
HCT: 35.5 % — ABNORMAL LOW (ref 36.0–46.0)
Hemoglobin: 10.6 g/dL — ABNORMAL LOW (ref 12.0–15.0)
Hemoglobin: 11.4 g/dL — ABNORMAL LOW (ref 12.0–15.0)
Immature Granulocytes: 0 %
Immature Granulocytes: 1 %
Lymphocytes Relative: 18 %
Lymphocytes Relative: 18 %
Lymphs Abs: 1.4 10*3/uL (ref 0.7–4.0)
Lymphs Abs: 1.5 10*3/uL (ref 0.7–4.0)
MCH: 24.6 pg — ABNORMAL LOW (ref 26.0–34.0)
MCH: 25.2 pg — ABNORMAL LOW (ref 26.0–34.0)
MCHC: 31.5 g/dL (ref 30.0–36.0)
MCHC: 32.1 g/dL (ref 30.0–36.0)
MCV: 78.2 fL — ABNORMAL LOW (ref 80.0–100.0)
MCV: 78.4 fL — ABNORMAL LOW (ref 80.0–100.0)
Monocytes Absolute: 0.8 10*3/uL (ref 0.1–1.0)
Monocytes Absolute: 0.8 10*3/uL (ref 0.1–1.0)
Monocytes Relative: 10 %
Monocytes Relative: 11 %
Neutro Abs: 5.5 10*3/uL (ref 1.7–7.7)
Neutro Abs: 6 10*3/uL (ref 1.7–7.7)
Neutrophils Relative %: 71 %
Neutrophils Relative %: 71 %
Platelets: 211 10*3/uL (ref 150–400)
Platelets: 233 10*3/uL (ref 150–400)
RBC: 4.31 MIL/uL (ref 3.87–5.11)
RBC: 4.53 MIL/uL (ref 3.87–5.11)
RDW: 13.8 % (ref 11.5–15.5)
RDW: 13.9 % (ref 11.5–15.5)
WBC: 7.8 10*3/uL (ref 4.0–10.5)
WBC: 8.4 10*3/uL (ref 4.0–10.5)
nRBC: 0 % (ref 0.0–0.2)
nRBC: 0 % (ref 0.0–0.2)

## 2020-12-12 LAB — BRAIN NATRIURETIC PEPTIDE: B Natriuretic Peptide: 178.5 pg/mL — ABNORMAL HIGH (ref 0.0–100.0)

## 2020-12-12 LAB — C-REACTIVE PROTEIN
CRP: 4.2 mg/dL — ABNORMAL HIGH (ref <1.0)
CRP: 7.6 mg/dL — ABNORMAL HIGH (ref <1.0)

## 2020-12-12 LAB — CBG MONITORING, ED
Glucose-Capillary: 104 mg/dL — ABNORMAL HIGH (ref 70–99)
Glucose-Capillary: 101 mg/dL — ABNORMAL HIGH (ref 70–99)
Glucose-Capillary: 98 mg/dL (ref 70–99)

## 2020-12-12 LAB — COMPREHENSIVE METABOLIC PANEL
ALT: 19 U/L (ref 0–44)
ALT: 21 U/L (ref 0–44)
AST: 36 U/L (ref 15–41)
AST: 41 U/L (ref 15–41)
Albumin: 3.6 g/dL (ref 3.5–5.0)
Albumin: 3.6 g/dL (ref 3.5–5.0)
Alkaline Phosphatase: 65 U/L (ref 38–126)
Alkaline Phosphatase: 65 U/L (ref 38–126)
Anion gap: 10 (ref 5–15)
Anion gap: 9 (ref 5–15)
BUN: 11 mg/dL (ref 8–23)
BUN: 9 mg/dL (ref 8–23)
CO2: 27 mmol/L (ref 22–32)
CO2: 30 mmol/L (ref 22–32)
Calcium: 8.9 mg/dL (ref 8.9–10.3)
Calcium: 9.1 mg/dL (ref 8.9–10.3)
Chloride: 97 mmol/L — ABNORMAL LOW (ref 98–111)
Chloride: 97 mmol/L — ABNORMAL LOW (ref 98–111)
Creatinine, Ser: 0.55 mg/dL (ref 0.44–1.00)
Creatinine, Ser: 0.61 mg/dL (ref 0.44–1.00)
GFR, Estimated: >60 mL/min (ref >60)
GFR, Estimated: >60 mL/min (ref >60)
Glucose, Bld: 103 mg/dL — ABNORMAL HIGH (ref 70–99)
Glucose, Bld: 111 mg/dL — ABNORMAL HIGH (ref 70–99)
Potassium: 4.2 mmol/L (ref 3.5–5.1)
Potassium: 4.9 mmol/L (ref 3.5–5.1)
Sodium: 134 mmol/L — ABNORMAL LOW (ref 135–145)
Sodium: 136 mmol/L (ref 135–145)
Total Bilirubin: 1.1 mg/dL (ref 0.3–1.2)
Total Bilirubin: 1.9 mg/dL — ABNORMAL HIGH (ref 0.3–1.2)
Total Protein: 6.9 g/dL (ref 6.5–8.1)
Total Protein: 7.3 g/dL (ref 6.5–8.1)

## 2020-12-12 LAB — D-DIMER, QUANTITATIVE
D-Dimer, Quant: 0.81 ug/mL-FEU — ABNORMAL HIGH (ref 0.00–0.50)
D-Dimer, Quant: 0.85 ug/mL-FEU — ABNORMAL HIGH (ref 0.00–0.50)

## 2020-12-12 LAB — SEDIMENTATION RATE: Sed Rate: 13 mm/hr (ref 0–22)

## 2020-12-12 LAB — FERRITIN: Ferritin: 417 ng/mL — ABNORMAL HIGH (ref 11–307)

## 2020-12-12 LAB — TROPONIN I (HIGH SENSITIVITY)
Troponin I (High Sensitivity): 15 ng/L (ref <18)
Troponin I (High Sensitivity): 17 ng/L (ref <18)

## 2020-12-12 LAB — LACTATE DEHYDROGENASE: LDH: 362 U/L — ABNORMAL HIGH (ref 98–192)

## 2020-12-12 LAB — GLUCOSE, CAPILLARY: Glucose-Capillary: 122 mg/dL — ABNORMAL HIGH (ref 70–99)

## 2020-12-12 LAB — PROCALCITONIN
Procalcitonin: <0.1 ng/mL
Procalcitonin: <0.1 ng/mL

## 2020-12-12 LAB — HEMOGLOBIN A1C
Hgb A1c MFr Bld: 6.5 % — ABNORMAL HIGH (ref 4.8–5.6)
Mean Plasma Glucose: 139.85 mg/dL

## 2020-12-12 MED ORDER — SENNA 8.6 MG PO TABS
1.0000 | ORAL_TABLET | Freq: Two times a day (BID) | ORAL | Status: DC
  Administered 2020-12-12 – 2020-12-17 (×10): 8.6 mg via ORAL
  Filled 2020-12-12 (×12): qty 1

## 2020-12-12 MED ORDER — ASCORBIC ACID 500 MG PO TABS
500.0000 mg | ORAL_TABLET | Freq: Every day | ORAL | Status: DC
  Administered 2020-12-12 – 2020-12-17 (×6): 500 mg via ORAL
  Filled 2020-12-12 (×6): qty 1

## 2020-12-12 MED ORDER — ONDANSETRON HCL 4 MG PO TABS: 4.0000 mg | ORAL_TABLET | Freq: Four times a day (QID) | ORAL | Status: DC | PRN

## 2020-12-12 MED ORDER — SODIUM CHLORIDE 0.9 % IV SOLN: INTRAVENOUS | Status: AC

## 2020-12-12 MED ORDER — ENOXAPARIN SODIUM 40 MG/0.4ML IJ SOSY
40.0000 mg | PREFILLED_SYRINGE | INTRAMUSCULAR | Status: DC
  Administered 2020-12-12 – 2020-12-17 (×6): 40 mg via SUBCUTANEOUS
  Filled 2020-12-12 (×6): qty 0.4

## 2020-12-12 MED ORDER — ATORVASTATIN CALCIUM 40 MG PO TABS
80.0000 mg | ORAL_TABLET | Freq: Every day | ORAL | Status: DC
  Administered 2020-12-12 – 2020-12-17 (×6): 80 mg via ORAL
  Filled 2020-12-12 (×6): qty 2

## 2020-12-12 MED ORDER — LORATADINE 10 MG PO TABS
10.0000 mg | ORAL_TABLET | Freq: Every day | ORAL | Status: DC
  Administered 2020-12-12 – 2020-12-17 (×6): 10 mg via ORAL
  Filled 2020-12-12 (×8): qty 1

## 2020-12-12 MED ORDER — LACOSAMIDE 50 MG PO TABS
150.0000 mg | ORAL_TABLET | Freq: Two times a day (BID) | ORAL | Status: DC
  Administered 2020-12-12 – 2020-12-17 (×11): 150 mg via ORAL
  Filled 2020-12-12 (×11): qty 3

## 2020-12-12 MED ORDER — HYDRALAZINE HCL 20 MG/ML IJ SOLN: 10.0000 mg | Freq: Four times a day (QID) | INTRAMUSCULAR | Status: DC | PRN

## 2020-12-12 MED ORDER — DONEPEZIL HCL 10 MG PO TABS
10.0000 mg | ORAL_TABLET | Freq: Every day | ORAL | Status: DC
  Administered 2020-12-13 – 2020-12-16 (×5): 10 mg via ORAL
  Filled 2020-12-12 (×5): qty 1

## 2020-12-12 MED ORDER — SODIUM CHLORIDE 0.9 % IV SOLN: INTRAVENOUS | Status: DC

## 2020-12-12 MED ORDER — ALBUTEROL SULFATE HFA 108 (90 BASE) MCG/ACT IN AERS: 2.0000 | INHALATION_SPRAY | RESPIRATORY_TRACT | Status: DC | PRN

## 2020-12-12 MED ORDER — INSULIN ASPART 100 UNIT/ML IJ SOLN
0.0000 [IU] | Freq: Three times a day (TID) | INTRAMUSCULAR | Status: DC
  Administered 2020-12-13: 1 [IU] via SUBCUTANEOUS
  Administered 2020-12-13: 2 [IU] via SUBCUTANEOUS
  Administered 2020-12-13: 3 [IU] via SUBCUTANEOUS
  Administered 2020-12-14: 1 [IU] via SUBCUTANEOUS
  Administered 2020-12-14: 3 [IU] via SUBCUTANEOUS
  Administered 2020-12-15 – 2020-12-16 (×4): 1 [IU] via SUBCUTANEOUS
  Filled 2020-12-12: qty 0.09

## 2020-12-12 MED ORDER — SODIUM CHLORIDE 0.9 % IV SOLN
200.0000 mg | Freq: Once | INTRAVENOUS | Status: AC
  Administered 2020-12-12: 200 mg via INTRAVENOUS
  Filled 2020-12-12: qty 40

## 2020-12-12 MED ORDER — POLYETHYLENE GLYCOL 3350 17 G PO PACK: 17.0000 g | PACK | Freq: Every day | ORAL | Status: DC | PRN

## 2020-12-12 MED ORDER — ACETAMINOPHEN 325 MG PO TABS
650.0000 mg | ORAL_TABLET | Freq: Four times a day (QID) | ORAL | Status: DC | PRN
  Administered 2020-12-14 – 2020-12-16 (×2): 650 mg via ORAL
  Filled 2020-12-12 (×2): qty 2

## 2020-12-12 MED ORDER — GUAIFENESIN-DM 100-10 MG/5ML PO SYRP
10.0000 mL | ORAL_SOLUTION | ORAL | Status: DC | PRN
  Administered 2020-12-13 – 2020-12-17 (×9): 10 mL via ORAL
  Filled 2020-12-12 (×9): qty 10

## 2020-12-12 MED ORDER — MEMANTINE HCL 10 MG PO TABS
10.0000 mg | ORAL_TABLET | Freq: Two times a day (BID) | ORAL | Status: DC
  Administered 2020-12-12 – 2020-12-17 (×11): 10 mg via ORAL
  Filled 2020-12-12: qty 1
  Filled 2020-12-12: qty 2
  Filled 2020-12-12 (×9): qty 1

## 2020-12-12 MED ORDER — LEVETIRACETAM 500 MG PO TABS
750.0000 mg | ORAL_TABLET | Freq: Two times a day (BID) | ORAL | Status: DC
  Administered 2020-12-12 – 2020-12-17 (×11): 750 mg via ORAL
  Filled 2020-12-12 (×12): qty 1

## 2020-12-12 MED ORDER — ONDANSETRON HCL 4 MG/2ML IJ SOLN: 4.0000 mg | Freq: Four times a day (QID) | INTRAMUSCULAR | Status: DC | PRN

## 2020-12-12 MED ORDER — ZINC SULFATE 220 (50 ZN) MG PO CAPS
220.0000 mg | ORAL_CAPSULE | Freq: Every day | ORAL | Status: DC
  Administered 2020-12-12 – 2020-12-17 (×6): 220 mg via ORAL
  Filled 2020-12-12 (×6): qty 1

## 2020-12-12 MED ORDER — AMLODIPINE BESYLATE 5 MG PO TABS
5.0000 mg | ORAL_TABLET | Freq: Every day | ORAL | Status: DC
  Administered 2020-12-12 – 2020-12-15 (×4): 5 mg via ORAL
  Filled 2020-12-12 (×4): qty 1

## 2020-12-12 MED ORDER — HYDRALAZINE HCL 20 MG/ML IJ SOLN
10.0000 mg | Freq: Four times a day (QID) | INTRAMUSCULAR | Status: DC | PRN
  Administered 2020-12-12: 10 mg via INTRAVENOUS
  Filled 2020-12-12: qty 1

## 2020-12-12 MED ORDER — SODIUM CHLORIDE 0.9 % IV SOLN
100.0000 mg | Freq: Every day | INTRAVENOUS | Status: AC
  Administered 2020-12-13 – 2020-12-14 (×2): 100 mg via INTRAVENOUS
  Filled 2020-12-12 (×2): qty 20

## 2020-12-12 NOTE — ED Notes (Signed)
IV medication and blood work delayed due to pt needing new IV. IV team consulted.

## 2020-12-12 NOTE — ED Notes (Signed)
This pt and EMT DJ did peri care and changed patient into clean depend.

## 2020-12-12 NOTE — ED Notes (Signed)
Margaret Hernandez [phone] Patients granddaughter would like an update

## 2020-12-12 NOTE — ED Notes (Signed)
IV team at bedside 

## 2020-12-12 NOTE — H&P (Signed)
History and Physical    Margaret Hernandez ZOX:[id number] DOB: Sep 26, 1929 DOA: 12/11/2020  [facility]  Patient coming from: Home / [facility] program   Chief Complaint:  Chief Complaint  Patient presents with   Altered Mental Status   Covid Positive     HPI:    87 year old female with past medical history of dementia, seizure disorder, hypertension, hyperlipidemia, diabetes mellitus type 2 who [facility] due to concerns over new diagnosis of COVID and associated hypoxia.  Patient is an extremely poor historian due to a history of advanced dementia.  Majority the history has been obtained from discussion with the emergency department provider, review of EMS notes as well as discussion with the daughter via phone conversation.  According to the daughter, over the past week her mother has become progressively more lethargic compared to her baseline.  Her mother has not been ambulating as much and has been unwilling to walk.  She is also exhibited a poor appetite and is not as interactive.  She also notes that over the same span of time she has been exhibiting increasingly worsening cough.  She states that the morning of 7/14 she required a significant amount of assistance to get on the transport to go to the [facility] which she typically does every Thursday.  [facility], patient underwent COVID-19 testing was found to be positive.  Patient was also found to be hypoxic with oxygen saturations in the 80s.  EMS was then contacted who promptly came to evaluate the patient and brought the [facility] department for further evaluation.  Upon evaluation in the emergency department COVID-19 PCR testing was confirmed to be positive.  Chest x-ray was performed which was unremarkable.  CT imaging of the head was  performed which revealed no evidence of acute disease.  Patient was initiated on intravenous volume resuscitation and hospitalist group was then called to assess the patient for admission to the hospital.  Review of Systems:   Review of Systems  Unable to perform ROS: Mental status change   Past Medical History:  Diagnosis Date   Dementia (HCC)    Diabetes mellitus without complication (HCC)    Hypertension     Past Surgical History:  Procedure Laterality Date   ABDOMINAL HYSTERECTOMY     SHOULDER SURGERY       reports that she has never smoked. She has never used smokeless tobacco. She reports that she does not drink alcohol and does not use drugs.  No Known Allergies  Family History  Problem Relation Age of Onset   Hypertension Mother    Hypertension Father      Prior to Admission medications   Medication Sig Start Date End Date Taking? Authorizing Provider  acetaminophen (TYLENOL) 325 MG tablet Take 650 mg by mouth every 6 (six) hours as needed for mild pain or headache.   Yes [provider]  amLODipine (NORVASC) 5 MG tablet Take 5 mg by mouth daily.   Yes [provider]  atorvastatin (LIPITOR) 80 MG tablet Take 80 mg by mouth daily.   Yes [provider]  calcium citrate (CALCITRATE - DOSED IN MG ELEMENTAL CALCIUM) 950 MG tablet Take 200 mg of elemental calcium by mouth daily.   Yes [provider]  cholecalciferol (VITAMIN D3) 25 MCG (1000 UNIT) tablet Take 1,000 Units  by mouth daily.   Yes [provider]  donepezil (ARICEPT) 10 MG tablet Take 10 mg by mouth at bedtime.   Yes [provider]  fexofenadine (ALLEGRA) 180 MG tablet Take 180 mg by mouth daily.   Yes [provider]  lacosamide 150 MG TABS Take 1 tablet (150 mg total) by mouth 2 (two) times daily. 07/11/20  Yes Glade Lloyd, MD  levETIRAcetam (KEPPRA) 750 MG tablet Take 1 tablet (750 mg total) by mouth 2 (two) times daily. 07/11/20  Yes Glade Lloyd, MD  memantine (NAMENDA) 10 MG tablet Take 10 mg by mouth 2 (two) times daily.   Yes [provider]  senna (SENOKOT) 8.6 MG TABS tablet Take 1 tablet by mouth 2 (two) times daily.   Yes [provider]    Physical Exam: Vitals:   12/12/20 0159 12/12/20 0200 12/12/20 0500 12/12/20 0730  BP: (!) 167/98 (!) 167/98 (!) 195/94 (!) 183/76  Pulse: 81 89 76 78  Resp: 16 18 16 20   Temp: 98.6 F (37 C)     TempSrc: Oral     SpO2: 100% 95% 96% 98%  Weight:      Height:        Constitutional: Lethargic but arousable, oriented x1.  Patient is currently not in acute distress..  Patient is somewhat cachectic. Skin: no rashes, no lesions, extremely poor skin turgor noted.. Eyes: Pupils are equally reactive to light.  No evidence of scleral icterus or conjunctival pallor.  ENMT: Extremely dry mucous membranes noted.  Posterior pharynx clear of any exudate or lesions.   Neck: normal, supple, no masses, no thyromegaly.  No evidence of jugular venous distension.   Respiratory: Mild bibasilar rales noted without evidence of associated wheezing.  Normal respiratory effort. No accessory muscle use.  Cardiovascular: Regular rate and rhythm, no murmurs / rubs / gallops. No extremity edema. 2+ pedal pulses. No carotid bruits.  Chest:   Nontender without crepitus or deformity.   Back:   Nontender without crepitus or deformity. Abdomen: Abdomen is soft and nontender.  No evidence of intra-abdominal masses.  Positive bowel sounds noted in all quadrants.   Musculoskeletal: Extremely poor muscle tone noted.  No joint deformity upper and lower extremities. Good ROM, no contractures.   Neurologic: Patient is extremely lethargic but arousable and oriented x1.  Patient only intermittently follows commands.  Patient is responsive to verbal and painful stimuli.  Patient does localize to pain.  Patient is moving all 4 extremities spontaneously.   Psychiatric: Unable to assess due to severe  lethargy.  Patient currently does not seem to possess insight as to her current situation.   Labs on Admission: I have personally reviewed following labs and imaging studies -   CBC: Recent Labs  Lab 12/11/20 2347  WBC 7.8  NEUTROABS 5.5  HGB 10.6*  HCT 33.7*  MCV 78.2*  PLT 233   Basic Metabolic Panel: Recent Labs  Lab 12/11/20 2347  NA 136  K 4.2  CL 97*  CO2 30  GLUCOSE 111*  BUN 11  CREATININE 0.61  CALCIUM 9.1   GFR: Estimated Creatinine Clearance: 50.4 mL/min (by C-G formula based on SCr of 0.61 mg/dL). Liver Function Tests: Recent Labs  Lab 12/11/20 2347  AST 36  ALT 19  ALKPHOS 65  BILITOT 1.1  PROT 6.9  ALBUMIN 3.6   No results for input(s): LIPASE, AMYLASE in the last 168 hours. No results for input(s): AMMONIA in the last 168 hours. Coagulation Profile:  No results for input(s): INR, PROTIME in the last 168 hours. Cardiac Enzymes: No results for input(s): CKTOTAL, CKMB, CKMBINDEX, TROPONINI in the last 168 hours. BNP (last 3 results) No results for input(s): PROBNP in the last 8760 hours. HbA1C: No results for input(s): HGBA1C in the last 72 hours. CBG: No results for input(s): GLUCAP in the last 168 hours. Lipid Profile: No results for input(s): CHOL, HDL, LDLCALC, TRIG, CHOLHDL, LDLDIRECT in the last 72 hours. Thyroid Function Tests: No results for input(s): TSH, T4TOTAL, FREET4, T3FREE, THYROIDAB in the last 72 hours. Anemia Panel: Recent Labs    12/11/20 2347  FERRITIN 417*   Urine analysis:    Component Value Date/Time   COLORURINE YELLOW 07/08/2020 1644   APPEARANCEUR CLEAR 07/08/2020 1644   APPEARANCEUR Hazy 02/25/2013 0622   LABSPEC 1.016 07/08/2020 1644   LABSPEC 1.018 02/25/2013 0622   PHURINE 5.0 07/08/2020 1644   GLUCOSEU NEGATIVE 07/08/2020 1644   GLUCOSEU 150 mg/dL 32/67/1245 8099   HGBUR SMALL (A) 07/08/2020 1644   BILIRUBINUR NEGATIVE 07/08/2020 1644   BILIRUBINUR Negative 02/25/2013 0622   KETONESUR NEGATIVE  07/08/2020 1644   PROTEINUR NEGATIVE 07/08/2020 1644   UROBILINOGEN 0.2 04/23/2014 1605   NITRITE NEGATIVE 07/08/2020 1644   LEUKOCYTESUR NEGATIVE 07/08/2020 1644   LEUKOCYTESUR Trace 02/25/2013 0622    Radiological Exams on Admission - Personally Reviewed: CT Head Wo Contrast  Result Date: 12/11/2020 CLINICAL DATA:  Mental status change. EXAM: CT HEAD WITHOUT CONTRAST TECHNIQUE: Contiguous axial images were obtained from the base of the skull through the vertex without intravenous contrast. COMPARISON:  July 08, 2020 FINDINGS: Brain: Similar mild age related global parenchymal volume loss with ex vacuo dilatation of the ventricular system. Stable burden of mild-to-moderate chronic ischemic microvascular white matter disease. Mineralization of bilateral basal ganglia. No evidence of acute infarction, hemorrhage, hydrocephalus, extra-axial collection or mass lesion/mass effect. Vascular: No hyperdense vessel. Atherosclerotic calcifications of the internal carotid arteries at the skull base. Skull: Hyperostosis frontalis. Negative for fracture or focal lesion. Sinuses/Orbits: The visualized portions of the paranasal sinuses and mastoid air cells are predominantly clear. Orbits are grossly unremarkable. Other: None IMPRESSION: 1. No acute intracranial findings. 2. Stable age related global parenchymal volume loss and chronic ischemic microvascular white matter disease. Electronically Signed   By: Maudry Mayhew MD   On: 12/11/2020 19:52    EKG: Personally reviewed.  Rhythm is normal sinus rhythm with heart rate of 74 bpm.  Incomplete left bundle branch block with evidence of LVH.  No dynamic ST segment changes appreciated.  Assessment/Plan Principal Problem:   COVID-19 virus infection  Patient is presenting with several day history of progressively worsening lethargy weakness and decreased appetite Patient is not exhibiting significant respiratory compromise secondary to the virus however.  While  at the patient's clinic there were reports that patient was hypoxic with saturations in the 80s, here in the emergency department patient is saturating in the mid 90s on room air. Regardless, patient is exhibiting significant lethargy and weakness consistent with metabolic encephalopathy likely related to COVID-19 infection.  Patient is also exhibiting volume depletion as result. Even in the absence of hypoxia patient would likely benefit from a 3-day course of intravenous remdesivir to reduce the risk of progression of disease which will be initiated now. Patient is not a candidate for initiation of steroid therapy in the absence of hypoxia Zinc and vitamin C supplementation As needed antitussives and bronchodilator therapy via MDI  Active Problems:   Acute metabolic encephalopathy  Thought to be secondary to COVID-19 infection volume depletion Assessment has been difficult due to patient's baseline dementia however daughter reports that at baseline patient is much more awake alert and alert Encephalopathy should spontaneously resolve with resolving COVID infection. If encephalopathy fails to improve will consider expansion of encephalopathy work-up. CT imaging of the head has been unremarkable.    Dementia with behavioral disturbance (HCC)  Minimizing uncomfortable stimuli Limiting sedating medications Encouraging family to visit is much as possible Nursing to closely monitor and frequently redirect the patient    Seizure disorder (HCC)  Continue home regimen of antiepileptic therapy    Mixed diabetic hyperlipidemia associated with type 2 diabetes mellitus (HCC)  Continue home regimen of statin therapy    Severe protein-calorie malnutrition (HCC)  Daughter reports over 15 pound weight loss in the past 1 to 2 months, likely secondary to progressive dementia and exacerbated by recent COVID illness Encouraging oral intake Nutrition consultation order placed, their input is  appreciated  Essential hypertension  Continue home regimen of antihypertensive therapy As needed intravenous antibiotics as for markedly elevated blood pressure    Code Status:  DNR Family Communication: Plan of care discussed with the daughter via phone conversation.  Status is: Observation  The patient remains OBS appropriate and will d/c before 2 midnights.  Dispo: The patient is from: Home              Anticipated d/c is to: Home              Patient currently is not medically stable to d/c.   Difficult to place patient No        Marinda Elk MD [facility] Pager [phone]  If 7PM-7AM, please contact night-coverage [email or web address] Use universal [place] password for that web site. If you do not have the password, please call the hospital operator.  12/12/2020, 8:10 AM

## 2020-12-12 NOTE — Progress Notes (Addendum)
Pt arrived to room 1428. RN and nurse tech changed linen, vitals taken, purwik placed. Pt is confused only alert to self currently resting comfortably with eyes closed. All current needs accommodated. Attempted to do admission questions Pt is unable to answer questions correctly.

## 2020-12-12 NOTE — Progress Notes (Signed)
This is a pleasant 87 year old lady with many medical problems who was admitted under hospital service due to acute metabolic encephalopathy and hypoxia as well as COVID infection.  Reportedly, she was hypoxic at the patient's clinic however Patient is saturating well over 98% on room air while here.  Chest x-ray negative for any acute pathology.  CT head negative for any acute pathology as well.  CRP minimally elevated.  Procalcitonin normal.  D-dimer normal.  She has been started on remdesivir for 3 days. Patient seen and examined.  Patient alert but oriented to self only.  Apparently she has advanced dementia.  Wonder if this is her baseline.  Looks very weak.  Will consult PT OT.  Blood pressure still elevated.  She takes only amlodipine which has been continued.  We will add as needed IV hydralazine.  Consult PT OT.

## 2020-12-12 NOTE — ED Notes (Signed)
OT notifed this RN that the pt had two rings on, and both rings were given to pts daughter.

## 2020-12-12 NOTE — ED Notes (Signed)
Pt right forearm IV infiltrated. IV meds stopped. Pt right arm swollen, will elevate and monitor site at this time.

## 2020-12-12 NOTE — Evaluation (Signed)
Occupational Therapy Evaluation Patient Details Name: Margaret Hernandez MRN: [id number] DOB: 09/30/1929 Today's Date: 12/12/2020    History of Present Illness Admitted for COVID-75. 87 year old female with past medical history of dementia, seizure disorder, hypertension, hyperlipidemia, diabetes mellitus type 2 who presents to [facility] long hospital emergency department from her provider's office at the [facility] program due to concerns over new diagnosis of COVID and associated hypoxia.   Clinical Impression   Margaret Hernandez is an 87 year old woman who lives at home with her daughter and is typically independent (with supervision) with ADLs and mobility. [facility] a [facility]. Patient presents on RA with a rattling cough and soft mittens in place. Mittens removed for evaluation. On evaluation patient is lethargic and despite multimodal cues would not open eyes for longer than 10 seconds to attend to therapist. Patient did not follow commands. Patient unable to effectively drink from straw or wash her face. Patient's RUE swollen from IV infiltration. Patient did exhibit some functional use of upper extremities to scratch her head and stabilize at side of bed. Patient max x 2 for bed mobility. Further mobility deferred due to level of alertness. O2 sat WFL during evaluation on RA. Replaced mittens and patient left in care of daughter. Patient's rings removed and given to daughter due to possibility of fingers swelling. At this time patient significantly impaired and therapist recommends short term rehab at discharge.     Follow Up Recommendations  SNF    Equipment Recommendations  Other (comment) (TBD)    Recommendations for Other Services       Precautions / Restrictions Precautions Precautions: Fall Restrictions Weight Bearing Restrictions: No      Mobility Bed Mobility Overal bed mobility: Needs Assistance Bed Mobility: Supine to Sit;Sit to Supine     Supine to sit: Mod  assist;+2 for physical assistance;+2 for safety/equipment;HOB elevated Sit to supine: +2 for physical assistance;+2 for safety/equipment;Max assist        Transfers                      Balance Overall balance assessment: Needs assistance Sitting-balance support: No upper extremity supported Sitting balance-Leahy Scale: Fair                                     ADL either performed or assessed with clinical judgement   ADL Overall ADL's : Needs assistance/impaired Eating/Feeding: Bed level;Maximal assistance   Grooming: Bed level;Total assistance   Upper Body Bathing: Total assistance;Bed level   Lower Body Bathing: Total assistance;Bed level   Upper Body Dressing : Total assistance;Bed level   Lower Body Dressing: Total assistance       Toileting- Clothing Manipulation and Hygiene: Total assistance;Bed level               Vision   Vision Assessment?: No apparent visual deficits     Perception     Praxis      Pertinent Vitals/Pain Pain Assessment: Faces Pain Score: 0-No pain     Hand Dominance Right   Extremity/Trunk Assessment Upper Extremity Assessment Upper Extremity Assessment: Generalized weakness   Lower Extremity Assessment Lower Extremity Assessment: Generalized weakness   Cervical / Trunk Assessment Cervical / Trunk Assessment: Normal   Communication     Cognition Arousal/Alertness: Lethargic Behavior During Therapy: Flat affect Overall Cognitive Status: Difficult to assess  General Comments       Exercises     Shoulder Instructions      Home Living Family/patient expects to be discharged to:: Private residence Living Arrangements: Children Available Help at Discharge: Family;Available 24 hours/day Type of Home: House                                  Prior Functioning/Environment Level of Independence: Needs assistance  Gait /  Transfers Assistance Needed: Typically able to ambulate without a device per daughter ADL's / Homemaking Assistance Needed: Typically sits in tub to bathe, daughter assists reaching back, otherwise mod indep with ADL tassk - supervision only.   Comments: [facility]es to [facility] 3 x a week        OT Problem List: Decreased strength;Decreased activity tolerance;Impaired balance (sitting and/or standing);Decreased cognition;Decreased safety awareness;Decreased knowledge of use of DME or AE      OT Treatment/Interventions: Self-care/ADL training;Therapeutic exercise;DME and/or AE instruction;Therapeutic activities;Balance training;Patient/family education    OT Goals(Current goals can be found in the care plan section) Acute Rehab OT Goals OT Goal Formulation: Patient unable to participate in goal setting Time For Goal Achievement: 12/26/20 Potential to Achieve Goals: Fair  OT Frequency: Min 2X/week   Barriers to D/C:            Co-evaluation              AM-PAC OT "6 Clicks" Daily Activity     Outcome Measure Help from another person eating meals?: A Lot Help from another person taking care of personal grooming?: Total Help from another person toileting, which includes using toliet, bedpan, or urinal?: Total Help from another person bathing (including washing, rinsing, drying)?: Total Help from another person to put on and taking off regular upper body clothing?: Total Help from another person to put on and taking off regular lower body clothing?: Total 6 Click Score: 7   End of Session Nurse Communication: Mobility status  Activity Tolerance: Patient limited by lethargy Patient left: in bed;with call bell/phone within reach;with family/visitor present  OT Visit Diagnosis: Muscle weakness (generalized) (M62.81)                Time: 5732-2025 OT Time Calculation (min): 18 min Charges:  OT General Charges $OT Visit: 1 Visit OT Evaluation $OT Eval Low Complexity: 1 Low  Kaevion Sinclair,  OTR/L Acute Care Rehab Services  Office [phone] Pager: [phone]   Kelli Churn 12/12/2020, 3:42 PM

## 2020-12-12 NOTE — ED Notes (Signed)
Purple DNR sticker on pt armband.  

## 2020-12-12 NOTE — ED Provider Notes (Signed)
Care assumed From Dr. Delford Field.  Patient with history of dementia here with generalized weakness, cough, recurrent falls.  Sent from PCPs office positive for COVID-19 with hypoxia.  No increased work of breathing here.  Lab work is pending. She did have a chest x-ray today that is reassuring.  CT head is stable.  Labs show normal electrolytes and creatinine.  She is not hypoxic or having any increased work of breathing. Age adjusted D-dimer is negative. Low suspicion for PE.   Vitals remained stable in the ED.  No hypoxia or increased work of breathing.  Inflammatory markers mildly elevated.  Given her failure to thrive at home recurrent falls, hospitalist admission discussed with Dr. Martyn Malay.    Glynn Octave, MD 12/12/20 [phone]

## 2020-12-12 NOTE — ED Notes (Signed)
Pts daughter here to see pt. Pts daughter provided with proper PPE before entering room.

## 2020-12-13 ENCOUNTER — Other Ambulatory Visit: Payer: Self-pay

## 2020-12-13 LAB — COMPREHENSIVE METABOLIC PANEL
ALT: 20 U/L (ref 0–44)
AST: 27 U/L (ref 15–41)
Albumin: 3.3 g/dL — ABNORMAL LOW (ref 3.5–5.0)
Alkaline Phosphatase: 58 U/L (ref 38–126)
Anion gap: 7 (ref 5–15)
BUN: 12 mg/dL (ref 8–23)
CO2: 30 mmol/L (ref 22–32)
Calcium: 8.7 mg/dL — ABNORMAL LOW (ref 8.9–10.3)
Chloride: 101 mmol/L (ref 98–111)
Creatinine, Ser: 0.63 mg/dL (ref 0.44–1.00)
GFR, Estimated: >60 mL/min (ref >60)
Glucose, Bld: 72 mg/dL (ref 70–99)
Potassium: 3.3 mmol/L — ABNORMAL LOW (ref 3.5–5.1)
Sodium: 138 mmol/L (ref 135–145)
Total Bilirubin: 0.5 mg/dL (ref 0.3–1.2)
Total Protein: 6.4 g/dL — ABNORMAL LOW (ref 6.5–8.1)

## 2020-12-13 LAB — GLUCOSE, CAPILLARY
Glucose-Capillary: 83 mg/dL (ref 70–99)
Glucose-Capillary: 213 mg/dL — ABNORMAL HIGH (ref 70–99)
Glucose-Capillary: 109 mg/dL — ABNORMAL HIGH (ref 70–99)
Glucose-Capillary: 193 mg/dL — ABNORMAL HIGH (ref 70–99)

## 2020-12-13 LAB — CBC WITH DIFFERENTIAL/PLATELET
Abs Immature Granulocytes: 0.02 10*3/uL (ref 0.00–0.07)
Basophils Absolute: 0 10*3/uL (ref 0.0–0.1)
Basophils Relative: 0 %
Eosinophils Absolute: 0 10*3/uL (ref 0.0–0.5)
Eosinophils Relative: 0 %
HCT: 32.1 % — ABNORMAL LOW (ref 36.0–46.0)
Hemoglobin: 10 g/dL — ABNORMAL LOW (ref 12.0–15.0)
Immature Granulocytes: 0 %
Lymphocytes Relative: 25 %
Lymphs Abs: 1.9 10*3/uL (ref 0.7–4.0)
MCH: 24.3 pg — ABNORMAL LOW (ref 26.0–34.0)
MCHC: 31.2 g/dL (ref 30.0–36.0)
MCV: 77.9 fL — ABNORMAL LOW (ref 80.0–100.0)
Monocytes Absolute: 1.2 10*3/uL — ABNORMAL HIGH (ref 0.1–1.0)
Monocytes Relative: 16 %
Neutro Abs: 4.6 10*3/uL (ref 1.7–7.7)
Neutrophils Relative %: 59 %
Platelets: 227 10*3/uL (ref 150–400)
RBC: 4.12 MIL/uL (ref 3.87–5.11)
RDW: 13.7 % (ref 11.5–15.5)
WBC: 7.8 10*3/uL (ref 4.0–10.5)
nRBC: 0 % (ref 0.0–0.2)

## 2020-12-13 LAB — D-DIMER, QUANTITATIVE: D-Dimer, Quant: 0.64 ug/mL-FEU — ABNORMAL HIGH (ref 0.00–0.50)

## 2020-12-13 LAB — C-REACTIVE PROTEIN: CRP: 11.8 mg/dL — ABNORMAL HIGH (ref <1.0)

## 2020-12-13 LAB — MAGNESIUM: Magnesium: 1.9 mg/dL (ref 1.7–2.4)

## 2020-12-13 MED ORDER — ADULT MULTIVITAMIN W/MINERALS CH
1.0000 | ORAL_TABLET | Freq: Every day | ORAL | Status: DC
  Administered 2020-12-14 – 2020-12-17 (×4): 1 via ORAL
  Filled 2020-12-13 (×4): qty 1

## 2020-12-13 MED ORDER — POTASSIUM CHLORIDE CRYS ER 20 MEQ PO TBCR
40.0000 meq | EXTENDED_RELEASE_TABLET | Freq: Once | ORAL | Status: AC
  Administered 2020-12-13: 40 meq via ORAL
  Filled 2020-12-13: qty 2

## 2020-12-13 MED ORDER — ENSURE ENLIVE PO LIQD
237.0000 mL | Freq: Two times a day (BID) | ORAL | Status: DC
  Administered 2020-12-14 – 2020-12-17 (×6): 237 mL via ORAL

## 2020-12-13 NOTE — Progress Notes (Signed)
Patient stated that she felt like she had to urinate but could not. Nurse Tech bladder scanned patient. Bladder scan revealed 0 mL's two times. Will continue to monitor.

## 2020-12-13 NOTE — Progress Notes (Signed)
PROGRESS NOTE    Margaret Hernandez  ZOX:[id number] DOB: [birth year] DOA: 12/11/2020 [facility]   Brief Narrative:  This is a pleasant 87 year old lady with many medical problems who was admitted under hospital service due to acute metabolic encephalopathy and hypoxia as well as COVID infection.  Reportedly, she was hypoxic at the PCPs clinic however Patient is saturating well over 98% on room air while here and has not required any oxygen.  Chest x-ray negative for any acute pathology.  CT head negative for any acute pathology as well.  CRP minimally elevated.  Procalcitonin normal.  D-dimer normal.  She has been started on remdesivir for 3 days.  Patient's encephalopathy has resolved.  Seen by PT OT and they recommend SNF.  Assessment & Plan:   Principal Problem:   COVID-19 virus infection Active Problems:   Dementia with behavioral disturbance (HCC)   Seizure disorder (HCC)   GERD without esophagitis   Mixed diabetic hyperlipidemia associated with type 2 diabetes mellitus (HCC)   Acute metabolic encephalopathy   Severe protein-calorie malnutrition (HCC)   COVID-19 viral infection: Patient doing much better.  No fever or hypoxia and chest x-ray with no findings regarding COVID.  Will complete 3 days of remdesivir.  Reassess tomorrow.  Acute metabolic encephalopathy/advanced dementia: Likely secondary to COVID infection.  She has advanced dementia.  She is alert and oriented x2 today, she was able to tell me the year and the current POTUS.  Per her daughter, this is her baseline.  Continue home medications.  Seizure disorder: Continue home antiepileptic medications.  Dyslipidemia: Continue home statin.  Severe protein calorie malnutrition: Nutrition consultation is already placed.  Essential hypertension: Slightly limited, much better than yesterday.  Continue home medications.  Type 2 diabetes mellitus: Controlled.  Continue SSI.  DVT  prophylaxis: enoxaparin (LOVENOX) injection 40 mg Start: 12/12/20 1000   Code Status: DNR  Family Communication: None present at bedside.  Discussed with patient's daughter Bonita QuinLinda over the phone.  She is agreeable with the plan of discharge to SNF.  Status is: Inpatient  Remains inpatient appropriate because:Unsafe d/c plan  Dispo: The patient is from: Home              Anticipated d/c is to: SNF              Patient currently is medically stable to d/c.   Difficult to place patient No        Estimated body mass index is 21.99 kg/m as calculated from the following:   Height as of this encounter: 5\' 6"  (1.676 m).   Weight as of this encounter: 61.8 kg.      Nutritional status:               Consultants:  None  Procedures:  None  Antimicrobials:  Anti-infectives (From admission, onward)    Start     Dose/Rate Route Frequency Ordered Stop   12/13/20 1000  remdesivir 100 mg in sodium chloride 0.9 % 100 mL IVPB       See Hyperspace for full Linked Orders Report.   100 mg 200 mL/hr over 30 Minutes Intravenous Daily 12/12/20 0753 12/15/20 0959   12/12/20 1000  remdesivir 200 mg in sodium chloride 0.9% 250 mL IVPB       See Hyperspace for full Linked Orders Report.   200 mg 580 mL/hr over 30 Minutes Intravenous Once 12/12/20 0753 12/12/20 1319  Subjective: Seen and examined.  Patient much more awake and alert compared to yesterday.  Oriented x2.  No complaints.  Objective: Vitals:   12/12/20 1644 12/12/20 1813 12/12/20 2203 12/13/20 0549  BP:  (!) 133/102 (!) 163/111 (!) 154/85  Pulse:  84 83 77  Resp:  16 18 18   Temp: 98.9 F (37.2 C) 98.1 F (36.7 C) 98.1 F (36.7 C) 98.6 F (37 C)  TempSrc: Oral Oral  Oral  SpO2:  97% 100% 100%  Weight:    61.8 kg  Height:       No intake or output data in the 24 hours ending 12/13/20 1052 Filed Weights   12/11/20 1635 12/13/20 0549  Weight: 75.3 kg 61.8 kg    Examination:  General exam:  Appears calm and comfortable  Respiratory system: Clear to auscultation. Respiratory effort normal. Cardiovascular system: S1 & S2 heard, RRR. No JVD, murmurs, rubs, gallops or clicks. No pedal edema. Gastrointestinal system: Abdomen is nondistended, soft and nontender. No organomegaly or masses felt. Normal bowel sounds heard. Central nervous system: Alert and oriented x2. No focal neurological deficits. Extremities: Symmetric 5 x 5 power. Skin: No rashes, lesions or ulcers Psychiatry: Judgement and insight appear poor   Data Reviewed: I have personally reviewed following labs and imaging studies  CBC: Recent Labs  Lab 12/11/20 2347 12/12/20 1051 12/13/20 0245  WBC 7.8 8.4 7.8  NEUTROABS 5.5 6.0 4.6  HGB 10.6* 11.4* 10.0*  HCT 33.7* 35.5* 32.1*  MCV 78.2* 78.4* 77.9*  PLT 233 211 227   Basic Metabolic Panel: Recent Labs  Lab 12/11/20 2347 12/12/20 1051 12/13/20 0245  NA 136 134* 138  K 4.2 4.9 3.3*  CL 97* 97* 101  CO2 30 27 30   GLUCOSE 111* 103* 72  BUN 11 9 12   CREATININE 0.61 0.55 0.63  CALCIUM 9.1 8.9 8.7*  MG  --   --  1.9   GFR: Estimated Creatinine Clearance: 45.5 mL/min (by C-G formula based on SCr of 0.63 mg/dL). Liver Function Tests: Recent Labs  Lab 12/11/20 2347 12/12/20 1051 12/13/20 0245  AST 36 41 27  ALT 19 21 20   ALKPHOS 65 65 58  BILITOT 1.1 1.9* 0.5  PROT 6.9 7.3 6.4*  ALBUMIN 3.6 3.6 3.3*   No results for input(s): LIPASE, AMYLASE in the last 168 hours. No results for input(s): AMMONIA in the last 168 hours. Coagulation Profile: No results for input(s): INR, PROTIME in the last 168 hours. Cardiac Enzymes: No results for input(s): CKTOTAL, CKMB, CKMBINDEX, TROPONINI in the last 168 hours. BNP (last 3 results) No results for input(s): PROBNP in the last 8760 hours. HbA1C: Recent Labs    12/12/20 1051  HGBA1C 6.5*   CBG: Recent Labs  Lab 12/12/20 0836 12/12/20 1147 12/12/20 1631 12/12/20 2152 12/13/20 0742  GLUCAP 104*  101* 98 122* 83   Lipid Profile: No results for input(s): CHOL, HDL, LDLCALC, TRIG, CHOLHDL, LDLDIRECT in the last 72 hours. Thyroid Function Tests: No results for input(s): TSH, T4TOTAL, FREET4, T3FREE, THYROIDAB in the last 72 hours. Anemia Panel: Recent Labs    12/11/20 2347  FERRITIN 417*   Sepsis Labs: Recent Labs  Lab 12/11/20 2347 12/12/20 1051  PROCALCITON <0.10 <0.10    Recent Results (from the past 240 hour(s))  Resp Panel by RT-PCR (Flu A&B, Covid) Nasopharyngeal Swab     Status: Abnormal   Collection Time: 12/12/20  2:00 AM   Specimen: Nasopharyngeal Swab; Nasopharyngeal(NP) swabs in vial transport medium  Result  Value Ref Range Status   SARS Coronavirus 2 by RT PCR POSITIVE (A) NEGATIVE Final    Comment: RESULT CALLED TO, READ BACK BY AND VERIFIED WITH: RN TABITHA AT 0345 12/12/20 CRUICKSHANK A (NOTE) SARS-CoV-2 target nucleic acids are DETECTED.  The SARS-CoV-2 RNA is generally detectable in upper respiratory specimens during the acute phase of infection. Positive results are indicative of the presence of the identified virus, but do not rule out bacterial infection or co-infection with other pathogens not detected by the test. Clinical correlation with patient history and other diagnostic information is necessary to determine patient infection status. The expected result is Negative.  Fact Sheet for Patients: BloggerCourse.com  Fact Sheet for Healthcare Providers: SeriousBroker.it  This test is not yet approved or cleared by the [place] FDA and  has been authorized for detection and/or diagnosis of SARS-CoV-2 by FDA under an Emergency Use Authorization (EUA).  This EUA will remain in effect (meaning this test  can be used) for the duration of  the COVID-19 declaration under Section 564(b)(1) of the Act, 21 U.S.C. section 360bbb-3(b)(1), unless the authorization is terminated or revoked sooner.      Influenza A by PCR NEGATIVE NEGATIVE Final   Influenza B by PCR NEGATIVE NEGATIVE Final    Comment: (NOTE) The Xpert Xpress SARS-CoV-2/FLU/RSV plus assay is intended as an aid in the diagnosis of influenza from Nasopharyngeal swab specimens and should not be used as a sole basis for treatment. Nasal washings and aspirates are unacceptable for Xpert Xpress SARS-CoV-2/FLU/RSV testing.  Fact Sheet for Patients: BloggerCourse.com  Fact Sheet for Healthcare Providers: SeriousBroker.it  This test is not yet approved or cleared by the [place] FDA and has been authorized for detection and/or diagnosis of SARS-CoV-2 by FDA under an Emergency Use Authorization (EUA). This EUA will remain in effect (meaning this test can be used) for the duration of the COVID-19 declaration under Section 564(b)(1) of the Act, 21 U.S.C. section 360bbb-3(b)(1), unless the authorization is terminated or revoked.  Performed at [facility], [address], [place], [place] [address]       Radiology Studies: CT Head Wo Contrast  Result Date: 12/11/2020 CLINICAL DATA:  Mental status change. EXAM: CT HEAD WITHOUT CONTRAST TECHNIQUE: Contiguous axial images were obtained from the base of the skull through the vertex without intravenous contrast. COMPARISON:  July 08, 2020 FINDINGS: Brain: Similar mild age related global parenchymal volume loss with ex vacuo dilatation of the ventricular system. Stable burden of mild-to-moderate chronic ischemic microvascular white matter disease. Mineralization of bilateral basal ganglia. No evidence of acute infarction, hemorrhage, hydrocephalus, extra-axial collection or mass lesion/mass effect. Vascular: No hyperdense vessel. Atherosclerotic calcifications of the internal carotid arteries at the skull base. Skull: Hyperostosis frontalis. Negative for fracture or focal lesion. Sinuses/Orbits: The  visualized portions of the paranasal sinuses and mastoid air cells are predominantly clear. Orbits are grossly unremarkable. Other: None IMPRESSION: 1. No acute intracranial findings. 2. Stable age related global parenchymal volume loss and chronic ischemic microvascular white matter disease. Electronically Signed   By: Maudry Mayhew MD   On: 12/11/2020 19:52    Scheduled Meds:  amLODipine  5 mg Oral Daily   vitamin C  500 mg Oral Daily   atorvastatin  80 mg Oral Daily   donepezil  10 mg Oral QHS   enoxaparin (LOVENOX) injection  40 mg Subcutaneous Q24H   insulin aspart  0-9 Units Subcutaneous TID AC & HS   lacosamide  150 mg  Oral BID   levETIRAcetam  750 mg Oral BID   loratadine  10 mg Oral Daily   memantine  10 mg Oral BID   potassium chloride  40 mEq Oral Once   senna  1 tablet Oral BID   zinc sulfate  220 mg Oral Daily   Continuous Infusions:  remdesivir 100 mg in NS 100 mL       LOS: 1 day   Time spent: 31 minutes   Hughie Closs, MD [facility]  12/13/2020, 10:52 AM   How to contact the [facility] Attending or Consulting provider 7A - 7P or covering provider during after hours 7P -7A, for this patient?  Check the care team in [facility] and look for a) attending/consulting TR[facility] provider listed and b) the [facility] team listed. Page or secure chat 7A-7P. Log into [email or web address] and use [place]'s universal password to access. If you do not have the password, please contact the hospital operator. Locate the [facility] provider you are looking for under [facility] and page to a number that you can be directly reached. If you still have difficulty reaching the provider, please page the [facility] (Director on Call) for the Hospitalists listed on amion for assistance.

## 2020-12-13 NOTE — Evaluation (Signed)
Physical Therapy Evaluation Patient Details Name: Margaret Hernandez MRN: [id number] DOB: 04/28/1930 Today's Date: 12/13/2020   History of Present Illness  Admitted for COVID-65. 87 year old female with past medical history of dementia, seizure disorder, hypertension, hyperlipidemia, diabetes mellitus type 2 who presents to [facility] long hospital emergency department from her provider's office at the [facility] program due to concerns over new diagnosis of COVID and associated hypoxia.   Clinical Impression  Pt admitted with above diagnosis. Pt reports using RW at baseline, but additional PLOF information unable to be obtained. Pt currently requiring assist to come to sitting EOB and  return to supine. Mod A +2 for STS and stand pivot to BSC, pt maintains strong posterior lean with trunk flexed, verbalizes fear of falling requiring cues for sequencing. Nurse tech providing total pericare, pt assisted back to supine with all needs in reach at EOS. Pt on RA with SpO2 100%, intermittent coughing during eval, declines sitting in recliner because wants to nap. Recommending SNF for short term strengthening prior to return home. Will continue to assess DME needs. Pt currently with functional limitations due to the deficits listed below (see PT Problem List). Pt will benefit from skilled PT to increase their independence and safety with mobility to allow discharge to the venue listed below.       Follow Up Recommendations SNF    Equipment Recommendations  Other (comment) (TBD)    Recommendations for Other Services       Precautions / Restrictions Precautions Precautions: Fall Restrictions Weight Bearing Restrictions: No      Mobility  Bed Mobility Overal bed mobility: Needs Assistance Bed Mobility: Supine to Sit;Sit to Supine  Supine to sit: Mod assist Sit to supine: Mod assist   General bed mobility comments: pt inches BLE over towards EOB with mod A to upright trunk to sitting EOB, mod A to lift BLE  back into bed and reposition to comfort    Transfers Overall transfer level: Needs assistance Equipment used: 2 person hand held assist Transfers: Sit to/from [employer] Sit to Stand: Mod assist;+2 physical assistance Stand pivot transfers: Mod assist;+2 physical assistance  General transfer comment: mod A +2 for rising to stand and pivot to [facility], pt using BUE to assist in powering up, strong posterior lean and maintains flexed trunk, verbalizes fear of falling, max VCs for sequencig and hand placement with transfers with fair carryover  Ambulation/Gait  General Gait Details: not attempted  Stairs            Wheelchair Mobility    Modified Rankin (Stroke Patients Only)       Balance Overall balance assessment: Needs assistance Sitting-balance support: Feet supported Sitting balance-Leahy Scale: Fair Sitting balance - Comments: seated EOB   Standing balance support: During functional activity;Bilateral upper extremity supported Standing balance-Leahy Scale: Poor Standing balance comment: mod A with BUE supported, strong posterior lean despite cues       Pertinent Vitals/Pain Pain Assessment: Faces Faces Pain Scale: No hurt    Home Living Family/patient expects to be discharged to:: Private residence Living Arrangements: Children Available Help at Discharge: Family;Available 24 hours/day Type of Home: House           Additional Comments: All information obtained from chart review, pt states she ambulates with RW but unable to provide additional details.    Prior Function Level of Independence: Needs assistance   Gait / Transfers Assistance Needed: Typically able to ambulate without a device per daughter  ADL's / Homemaking  Assistance Needed: Typically sits in tub to bathe, daughter assists reaching back, otherwise mod indep with ADL tassk - supervision only.  Comments: [facility]; All information obtained from chart review, pt  states she ambulates with RW but unable to provide additional details.     Hand Dominance   Dominant Hand: Right    Extremity/Trunk Assessment   Upper Extremity Assessment Upper Extremity Assessment: Generalized weakness    Lower Extremity Assessment Lower Extremity Assessment: Generalized weakness (AROM WNL throughout, strength 3+/5 grossly)    Cervical / Trunk Assessment Cervical / Trunk Assessment: Kyphotic  Communication      Cognition Arousal/Alertness: Awake/alert Behavior During Therapy: Flat affect Overall Cognitive Status: Difficult to assess  General Comments: Pt responds to name, follows commands appropriately. Unable to provide details regarding PLOF and home setup.      General Comments General comments (skin integrity, edema, etc.): Pt on RA with SpO2 100%, coughing intermittently during eval    Exercises     Assessment/Plan    PT Assessment Patient needs continued PT services  PT Problem List Decreased strength;Decreased range of motion;Decreased activity tolerance;Decreased balance;Decreased mobility;Decreased cognition;Decreased safety awareness;Cardiopulmonary status limiting activity       PT Treatment Interventions DME instruction;Gait training;Functional mobility training;Therapeutic activities;Therapeutic exercise;Balance training;Cognitive remediation;Patient/family education    PT Goals (Current goals can be found in the Care Plan section)  Acute Rehab PT Goals Patient Stated Goal: have BM on BSC PT Goal Formulation: With patient Time For Goal Achievement: 12/27/20 Potential to Achieve Goals: Fair    Frequency Min 2X/week   Barriers to discharge        Co-evaluation               AM-PAC PT "6 Clicks" Mobility  Outcome Measure Help needed turning from your back to your side while in a flat bed without using bedrails?: A Lot Help needed moving from lying on your back to sitting on the side of a flat bed without using bedrails?: A  Lot Help needed moving to and from a bed to a chair (including a wheelchair)?: Total Help needed standing up from a chair using your arms (e.g., wheelchair or bedside chair)?: Total Help needed to walk in hospital room?: Total Help needed climbing 3-5 steps with a railing? : Total 6 Click Score: 8    End of Session Equipment Utilized During Treatment: Gait belt Activity Tolerance: Patient tolerated treatment well Patient left: in bed;with call bell/phone within reach;with bed alarm set Nurse Communication: Mobility status;Other (comment) (assisted with BSC transfer and peri care) PT Visit Diagnosis: Unsteadiness on feet (R26.81);Other abnormalities of gait and mobility (R26.89);Muscle weakness (generalized) (M62.81)    Time: 5329-9242 PT Time Calculation (min) (ACUTE ONLY): 22 min   Charges:   PT Evaluation $PT Eval Low Complexity: 1 Low PT Treatments $Therapeutic Activity: 8-22 mins         Tori Vondell Sowell PT, DPT 12/13/20, 11:42 AM

## 2020-12-13 NOTE — Progress Notes (Signed)
End of shift:  Patient resting in bed at shift change. Telemetry monitor on. Bed alarm on. Gave report to [employer] , RN.

## 2020-12-13 NOTE — Progress Notes (Signed)
Initial Nutrition Assessment  DOCUMENTATION CODES:   Not applicable  INTERVENTION:   Liberalize diet to REGULAR  Ensure Enlive po BID, each supplement provides 350 kcal and 20 grams of protein MVI with minerals daily   NUTRITION DIAGNOSIS:   Increased nutrient needs related to acute illness as evidenced by estimated needs.  GOAL:   Patient will meet greater than or equal to 90% of their needs  MONITOR:   PO intake, Supplement acceptance, Weight trends, Labs, I & O's  REASON FOR ASSESSMENT:   Consult Assessment of nutrition requirement/status  ASSESSMENT:   Patient with PMH significant for dementia, seizure disorder, HTN, HLD, and DM. Presents this admission with COVID 19 infection.   Unable to obtain nutrition history from patient. No intake documented this admission. Liberalize diet to provide patient with more option. RD to provide supplementation to maximize kcal and protein this admission. Records lack weight history over the last year. Will need to obtain NFPE to assess for malnutrition.   Medications: Vitamin C, SS novolog, senokot, zinc sulfate  Labs: K 3.3 (L)  Diet Order:   Diet Order             Diet heart healthy/carb modified Room service appropriate? No; Fluid consistency: Thin  Diet effective now                   EDUCATION NEEDS:   Not appropriate for education at this time  Skin:  Skin Assessment: Reviewed RN Assessment  Last BM:  7/16  Height:   Ht Readings from Last 1 Encounters:  12/11/20 5\' 6"  (1.676 m)    Weight:   Wt Readings from Last 1 Encounters:  12/13/20 61.8 kg    BMI:  Body mass index is 21.99 kg/m.  Estimated Nutritional Needs:   Kcal:  1700-1900 kcal  Protein:  85-100 grams  Fluid:  >/= 1.7 L/day   12/15/20 MS, RD, LDN, CNSC Clinical Nutrition Pager listed in AMION

## 2020-12-14 LAB — CBC WITH DIFFERENTIAL/PLATELET
Abs Immature Granulocytes: 0.03 10*3/uL (ref 0.00–0.07)
Basophils Absolute: 0 10*3/uL (ref 0.0–0.1)
Basophils Relative: 0 %
Eosinophils Absolute: 0 10*3/uL (ref 0.0–0.5)
Eosinophils Relative: 1 %
HCT: 28.5 % — ABNORMAL LOW (ref 36.0–46.0)
Hemoglobin: 8.9 g/dL — ABNORMAL LOW (ref 12.0–15.0)
Immature Granulocytes: 1 %
Lymphocytes Relative: 32 %
Lymphs Abs: 2 10*3/uL (ref 0.7–4.0)
MCH: 24.5 pg — ABNORMAL LOW (ref 26.0–34.0)
MCHC: 31.2 g/dL (ref 30.0–36.0)
MCV: 78.5 fL — ABNORMAL LOW (ref 80.0–100.0)
Monocytes Absolute: 1 10*3/uL (ref 0.1–1.0)
Monocytes Relative: 16 %
Neutro Abs: 3.2 10*3/uL (ref 1.7–7.7)
Neutrophils Relative %: 50 %
Platelets: 213 10*3/uL (ref 150–400)
RBC: 3.63 MIL/uL — ABNORMAL LOW (ref 3.87–5.11)
RDW: 13.6 % (ref 11.5–15.5)
WBC: 6.3 10*3/uL (ref 4.0–10.5)
nRBC: 0 % (ref 0.0–0.2)

## 2020-12-14 LAB — COMPREHENSIVE METABOLIC PANEL
ALT: 22 U/L (ref 0–44)
AST: 29 U/L (ref 15–41)
Albumin: 2.8 g/dL — ABNORMAL LOW (ref 3.5–5.0)
Alkaline Phosphatase: 57 U/L (ref 38–126)
Anion gap: 7 (ref 5–15)
BUN: 21 mg/dL (ref 8–23)
CO2: 28 mmol/L (ref 22–32)
Calcium: 8.2 mg/dL — ABNORMAL LOW (ref 8.9–10.3)
Chloride: 102 mmol/L (ref 98–111)
Creatinine, Ser: 0.64 mg/dL (ref 0.44–1.00)
GFR, Estimated: >60 mL/min (ref >60)
Glucose, Bld: 90 mg/dL (ref 70–99)
Potassium: 3.3 mmol/L — ABNORMAL LOW (ref 3.5–5.1)
Sodium: 137 mmol/L (ref 135–145)
Total Bilirubin: 0.5 mg/dL (ref 0.3–1.2)
Total Protein: 5.6 g/dL — ABNORMAL LOW (ref 6.5–8.1)

## 2020-12-14 LAB — MAGNESIUM: Magnesium: 1.9 mg/dL (ref 1.7–2.4)

## 2020-12-14 LAB — C-REACTIVE PROTEIN: CRP: 6.7 mg/dL — ABNORMAL HIGH (ref <1.0)

## 2020-12-14 LAB — GLUCOSE, CAPILLARY
Glucose-Capillary: 108 mg/dL — ABNORMAL HIGH (ref 70–99)
Glucose-Capillary: 230 mg/dL — ABNORMAL HIGH (ref 70–99)
Glucose-Capillary: 117 mg/dL — ABNORMAL HIGH (ref 70–99)
Glucose-Capillary: 147 mg/dL — ABNORMAL HIGH (ref 70–99)

## 2020-12-14 LAB — D-DIMER, QUANTITATIVE: D-Dimer, Quant: 0.63 ug/mL-FEU — ABNORMAL HIGH (ref 0.00–0.50)

## 2020-12-14 MED ORDER — PANTOPRAZOLE SODIUM 40 MG PO TBEC
40.0000 mg | DELAYED_RELEASE_TABLET | Freq: Every day | ORAL | Status: DC
  Administered 2020-12-14 – 2020-12-17 (×4): 40 mg via ORAL
  Filled 2020-12-14 (×5): qty 1

## 2020-12-14 MED ORDER — POTASSIUM CHLORIDE CRYS ER 20 MEQ PO TBCR
40.0000 meq | EXTENDED_RELEASE_TABLET | Freq: Once | ORAL | Status: AC
  Administered 2020-12-14: 40 meq via ORAL
  Filled 2020-12-14: qty 2

## 2020-12-14 NOTE — Progress Notes (Signed)
PROGRESS NOTE    KALIE CABRAL  KGU:[id number] DOB: [birth year] DOA: 12/11/2020 [facility]CP[facility]   Brief Narrative:  This is a pleasant 87 year old lady with many medical problems who was admitted under hospital service due to acute metabolic encephalopathy and hypoxia as well as COVID infection.  Reportedly, she was hypoxic at the PCPs clinic however Patient is saturating well over 98% on room air while here and has not required any oxygen.  Chest x-ray negative for any acute pathology.  CT head negative for any acute pathology as well.  CRP minimally elevated.  Procalcitonin normal.  D-dimer normal.  She has been started on remdesivir for 3 days.  Patient's encephalopathy has resolved to her baseline.  Seen by PT OT and they recommend SNF.  Assessment & Plan:   Principal Problem:   COVID-19 virus infection Active Problems:   Dementia with behavioral disturbance (HCC)   Seizure disorder (HCC)   GERD without esophagitis   Mixed diabetic hyperlipidemia associated with type 2 diabetes mellitus (HCC)   Acute metabolic encephalopathy   Severe protein-calorie malnutrition (HCC)   COVID-19 viral infection:  -No fever or hypoxia and chest x-ray with no findings regarding COVID.   -complete 3 days of remdesivir  Acute metabolic encephalopathy/advanced dementia: Likely secondary to COVID infection.  She also has advanced dementia.   -Per her daughter, this is her baseline.  Continue home medications.  Seizure disorder: Continue home antiepileptic medications.  Dyslipidemia: Continue home statin.  Severe protein calorie malnutrition: Nutrition consultation is already placed.  Essential hypertension: Slightly limited, much better than yesterday.  Continue home medications.  Type 2 diabetes mellitus: Controlled.  Continue SSI.  DVT prophylaxis: enoxaparin (LOVENOX) injection 40 mg Start: 12/12/20 1000   Code Status: DNR    Status is:  Inpatient  Remains inpatient appropriate because:Unsafe d/c plan  Dispo: The patient is from: Home              Anticipated d/c is to: SNF              Patient currently is medically stable to d/c.   Difficult to place patient No   Antimicrobials:  Anti-infectives (From admission, onward)    Start     Dose/Rate Route Frequency Ordered Stop   12/13/20 1000  remdesivir 100 mg in sodium chloride 0.9 % 100 mL IVPB       See Hyperspace for full Linked Orders Report.   100 mg 200 mL/hr over 30 Minutes Intravenous Daily 12/12/20 0753 12/15/20 0959   12/12/20 1000  remdesivir 200 mg in sodium chloride 0.9% 250 mL IVPB       See Hyperspace for full Linked Orders Report.   200 mg 580 mL/hr over 30 Minutes Intravenous Once 12/12/20 0753 12/12/20 1319          Subjective: + cough  Eating well  Objective: Vitals:   12/13/20 2030 12/13/20 2031 12/14/20 0519 12/14/20 0749  BP:  (!) 138/58 (!) 155/92   Pulse: 76 77 68   Resp: 16  12   Temp: 99.1 F (37.3 C)  98.9 F (37.2 C)   TempSrc: Oral  Oral   SpO2: 96% 100% 100% 99%  Weight:      Height:        Intake/Output Summary (Last 24 hours) at 12/14/2020 1036 Last data filed at 12/14/2020 0514 Gross per 24 hour  Intake 272.23 ml  Output 200 ml  Net 72.23 ml   Ceasar Mons  Weights   12/11/20 1635 12/13/20 0549  Weight: 75.3 kg 61.8 kg    Examination:   General: Appearance:    Elderly female in no acute distress     Lungs:     Poor effort, high pitched dry cough, respirations unlabored  Heart:    Normal heart rate.    MS:   All extremities are intact.    Neurologic:   Awake, pleasant and cooperative      Data Reviewed: I have personally reviewed following labs and imaging studies  CBC: Recent Labs  Lab 12/11/20 2347 12/12/20 1051 12/13/20 0245 12/14/20 0335  WBC 7.8 8.4 7.8 6.3  NEUTROABS 5.5 6.0 4.6 3.2  HGB 10.6* 11.4* 10.0* 8.9*  HCT 33.7* 35.5* 32.1* 28.5*  MCV 78.2* 78.4* 77.9* 78.5*  PLT 233 211 227 213    Basic Metabolic Panel: Recent Labs  Lab 12/11/20 2347 12/12/20 1051 12/13/20 0245 12/14/20 0335  NA 136 134* 138 137  K 4.2 4.9 3.3* 3.3*  CL 97* 97* 101 102  CO2 30 27 30 28   GLUCOSE 111* 103* 72 90  BUN 11 9 12 21   CREATININE 0.61 0.55 0.63 0.64  CALCIUM 9.1 8.9 8.7* 8.2*  MG  --   --  1.9 1.9   GFR: Estimated Creatinine Clearance: 45.5 mL/min (by C-G formula based on SCr of 0.64 mg/dL). Liver Function Tests: Recent Labs  Lab 12/11/20 2347 12/12/20 1051 12/13/20 0245 12/14/20 0335  AST 36 41 27 29  ALT 19 21 20 22   ALKPHOS 65 65 58 57  BILITOT 1.1 1.9* 0.5 0.5  PROT 6.9 7.3 6.4* 5.6*  ALBUMIN 3.6 3.6 3.3* 2.8*   No results for input(s): LIPASE, AMYLASE in the last 168 hours. No results for input(s): AMMONIA in the last 168 hours. Coagulation Profile: No results for input(s): INR, PROTIME in the last 168 hours. Cardiac Enzymes: No results for input(s): CKTOTAL, CKMB, CKMBINDEX, TROPONINI in the last 168 hours. BNP (last 3 results) No results for input(s): PROBNP in the last 8760 hours. HbA1C: Recent Labs    12/12/20 1051  HGBA1C 6.5*   CBG: Recent Labs  Lab 12/13/20 0742 12/13/20 1119 12/13/20 1627 12/13/20 2027 12/14/20 0800  GLUCAP 83 213* 109* 193* 108*   Lipid Profile: No results for input(s): CHOL, HDL, LDLCALC, TRIG, CHOLHDL, LDLDIRECT in the last 72 hours. Thyroid Function Tests: No results for input(s): TSH, T4TOTAL, FREET4, T3FREE, THYROIDAB in the last 72 hours. Anemia Panel: Recent Labs    12/11/20 2347  FERRITIN 417*   Sepsis Labs: Recent Labs  Lab 12/11/20 2347 12/12/20 1051  PROCALCITON <0.10 <0.10    Recent Results (from the past 240 hour(s))  Resp Panel by RT-PCR (Flu A&B, Covid) Nasopharyngeal Swab     Status: Abnormal   Collection Time: 12/12/20  2:00 AM   Specimen: Nasopharyngeal Swab; Nasopharyngeal(NP) swabs in vial transport medium  Result Value Ref Range Status   SARS Coronavirus 2 by RT PCR POSITIVE (A)  NEGATIVE Final    Comment: RESULT CALLED TO, READ BACK BY AND VERIFIED WITH: RN TABITHA AT 0345 12/12/20 CRUICKSHANK A (NOTE) SARS-CoV-2 target nucleic acids are DETECTED.  The SARS-CoV-2 RNA is generally detectable in upper respiratory specimens during the acute phase of infection. Positive results are indicative of the presence of the identified virus, but do not rule out bacterial infection or co-infection with other pathogens not detected by the test. Clinical correlation with patient history and other diagnostic information is necessary to determine patient infection  status. The expected result is Negative.  Fact Sheet for Patients: BloggerCourse.com  Fact Sheet for Healthcare Providers: SeriousBroker.it  This test is not yet approved or cleared by the [place] FDA and  has been authorized for detection and/or diagnosis of SARS-CoV-2 by FDA under an Emergency Use Authorization (EUA).  This EUA will remain in effect (meaning this test  can be used) for the duration of  the COVID-19 declaration under Section 564(b)(1) of the Act, 21 U.S.C. section 360bbb-3(b)(1), unless the authorization is terminated or revoked sooner.     Influenza A by PCR NEGATIVE NEGATIVE Final   Influenza B by PCR NEGATIVE NEGATIVE Final    Comment: (NOTE) The Xpert Xpress SARS-CoV-2/FLU/RSV plus assay is intended as an aid in the diagnosis of influenza from Nasopharyngeal swab specimens and should not be used as a sole basis for treatment. Nasal washings and aspirates are unacceptable for Xpert Xpress SARS-CoV-2/FLU/RSV testing.  Fact Sheet for Patients: BloggerCourse.com  Fact Sheet for Healthcare Providers: SeriousBroker.it  This test is not yet approved or cleared by the [place] FDA and has been authorized for detection and/or diagnosis of SARS-CoV-2 by FDA under an Emergency Use  Authorization (EUA). This EUA will remain in effect (meaning this test can be used) for the duration of the COVID-19 declaration under Section 564(b)(1) of the Act, 21 U.S.C. section 360bbb-3(b)(1), unless the authorization is terminated or revoked.  Performed at [facility], [address], [place], [place] [address]       Radiology Studies: No results found.  Scheduled Meds:  amLODipine  5 mg Oral Daily   vitamin C  500 mg Oral Daily   atorvastatin  80 mg Oral Daily   donepezil  10 mg Oral QHS   enoxaparin (LOVENOX) injection  40 mg Subcutaneous Q24H   feeding supplement  237 mL Oral BID BM   insulin aspart  0-9 Units Subcutaneous TID AC & HS   lacosamide  150 mg Oral BID   levETIRAcetam  750 mg Oral BID   loratadine  10 mg Oral Daily   memantine  10 mg Oral BID   multivitamin with minerals  1 tablet Oral Daily   pantoprazole  40 mg Oral Daily   senna  1 tablet Oral BID   zinc sulfate  220 mg Oral Daily   Continuous Infusions:  remdesivir 100 mg in NS 100 mL 100 mg (12/14/20 1017)     LOS: 2 days   Time spent: 31 minutes   Joseph Art, DO [facility]  12/14/2020, 10:36 AM   How to contact the [facility] Attending or Consulting provider 7A - 7P or covering provider during after hours 7P -7A, for this patient?  Check the care team in [facility] and look for a) attending/consulting TR[facility] provider listed and b) the [facility] team listed. Page or secure chat 7A-7P. Log into [email or web address] and use [facility] universal password to access. If you do not have the password, please contact the hospital operator. Locate the [facility] provider you are looking for under [facility] and page to a number that you can be directly reached. If you still have difficulty reaching the provider, please page the [facility] (Director on Call) for the Hospitalists listed on amion for assistance.

## 2020-12-15 LAB — COMPREHENSIVE METABOLIC PANEL
ALT: 22 U/L (ref 0–44)
AST: 28 U/L (ref 15–41)
Albumin: 2.9 g/dL — ABNORMAL LOW (ref 3.5–5.0)
Alkaline Phosphatase: 55 U/L (ref 38–126)
Anion gap: 8 (ref 5–15)
BUN: 20 mg/dL (ref 8–23)
CO2: 30 mmol/L (ref 22–32)
Calcium: 8.9 mg/dL (ref 8.9–10.3)
Chloride: 103 mmol/L (ref 98–111)
Creatinine, Ser: 0.72 mg/dL (ref 0.44–1.00)
GFR, Estimated: >60 mL/min (ref >60)
Glucose, Bld: 100 mg/dL — ABNORMAL HIGH (ref 70–99)
Potassium: 3.6 mmol/L (ref 3.5–5.1)
Sodium: 141 mmol/L (ref 135–145)
Total Bilirubin: 0.4 mg/dL (ref 0.3–1.2)
Total Protein: 5.6 g/dL — ABNORMAL LOW (ref 6.5–8.1)

## 2020-12-15 LAB — CBC WITH DIFFERENTIAL/PLATELET
Abs Immature Granulocytes: 0.02 10*3/uL (ref 0.00–0.07)
Basophils Absolute: 0 10*3/uL (ref 0.0–0.1)
Basophils Relative: 0 %
Eosinophils Absolute: 0.1 10*3/uL (ref 0.0–0.5)
Eosinophils Relative: 1 %
HCT: 28.3 % — ABNORMAL LOW (ref 36.0–46.0)
Hemoglobin: 8.9 g/dL — ABNORMAL LOW (ref 12.0–15.0)
Immature Granulocytes: 0 %
Lymphocytes Relative: 31 %
Lymphs Abs: 1.9 10*3/uL (ref 0.7–4.0)
MCH: 24.7 pg — ABNORMAL LOW (ref 26.0–34.0)
MCHC: 31.4 g/dL (ref 30.0–36.0)
MCV: 78.6 fL — ABNORMAL LOW (ref 80.0–100.0)
Monocytes Absolute: 0.9 10*3/uL (ref 0.1–1.0)
Monocytes Relative: 14 %
Neutro Abs: 3.3 10*3/uL (ref 1.7–7.7)
Neutrophils Relative %: 54 %
Platelets: 247 10*3/uL (ref 150–400)
RBC: 3.6 MIL/uL — ABNORMAL LOW (ref 3.87–5.11)
RDW: 13.9 % (ref 11.5–15.5)
WBC: 6.2 10*3/uL (ref 4.0–10.5)
nRBC: 0 % (ref 0.0–0.2)

## 2020-12-15 LAB — GLUCOSE, CAPILLARY
Glucose-Capillary: 102 mg/dL — ABNORMAL HIGH (ref 70–99)
Glucose-Capillary: 134 mg/dL — ABNORMAL HIGH (ref 70–99)
Glucose-Capillary: 184 mg/dL — ABNORMAL HIGH (ref 70–99)
Glucose-Capillary: 122 mg/dL — ABNORMAL HIGH (ref 70–99)

## 2020-12-15 LAB — D-DIMER, QUANTITATIVE: D-Dimer, Quant: 0.61 ug/mL-FEU — ABNORMAL HIGH (ref 0.00–0.50)

## 2020-12-15 LAB — MAGNESIUM: Magnesium: 1.9 mg/dL (ref 1.7–2.4)

## 2020-12-15 LAB — C-REACTIVE PROTEIN: CRP: 3.4 mg/dL — ABNORMAL HIGH (ref <1.0)

## 2020-12-15 MED ORDER — AMLODIPINE BESYLATE 10 MG PO TABS
10.0000 mg | ORAL_TABLET | Freq: Every day | ORAL | Status: DC
  Administered 2020-12-16 – 2020-12-17 (×2): 10 mg via ORAL
  Filled 2020-12-15 (×3): qty 1

## 2020-12-15 MED ORDER — BENZONATATE 100 MG PO CAPS
100.0000 mg | ORAL_CAPSULE | Freq: Three times a day (TID) | ORAL | Status: DC | PRN
  Administered 2020-12-15 – 2020-12-17 (×2): 100 mg via ORAL
  Filled 2020-12-15 (×2): qty 1

## 2020-12-15 NOTE — Progress Notes (Signed)
PROGRESS NOTE    Margaret Hernandez  VZD:[id number] DOB: 08-15-1929 DOA: 12/11/2020 PCP: [facility]   Brief Narrative:  This is a pleasant 87 year old lady with many medical problems who was admitted under hospital service due to acute metabolic encephalopathy and hypoxia as well as COVID infection.  Reportedly, she was hypoxic at the PCPs clinic however Patient is saturating well over 98% on room air while here and has not required any oxygen.  Chest x-ray negative for any acute pathology.  CT head negative for any acute pathology as well.  CRP minimally elevated.  Procalcitonin normal.  D-dimer normal.  She has been started on remdesivir for 3 days.  Patient's encephalopathy has resolved to her baseline.  Seen by PT/OT and they recommend SNF.  Assessment & Plan:   Principal Problem:   COVID-19 virus infection Active Problems:   Dementia with behavioral disturbance (HCC)   Seizure disorder (HCC)   GERD without esophagitis   Mixed diabetic hyperlipidemia associated with type 2 diabetes mellitus (HCC)   Acute metabolic encephalopathy   Severe protein-calorie malnutrition (HCC)   COVID-19 viral infection:  -No fever or hypoxia and chest x-ray with no findings regarding COVID.   -complete 3 days of remdesivir  Acute metabolic encephalopathy/advanced dementia: Likely secondary to COVID infection.  She also has advanced dementia.   -Per her daughter, this is her baseline.  Continue home medications.  Seizure disorder: Continue home antiepileptic medications.  Dyslipidemia: Continue home statin.  Severe protein calorie malnutrition: Nutrition consultation is already placed.  Essential hypertension: Slightly limited, much better than yesterday.  Continue home medications.  Type 2 diabetes mellitus: Controlled.  Continue SSI.  DVT prophylaxis: enoxaparin (LOVENOX) injection 40 mg Start: 12/12/20 1000   Code Status: DNR    Status is:  Inpatient  Remains inpatient appropriate because:Unsafe d/c plan  Dispo: The patient is from: Home              Anticipated d/c is to: SNF              Patient currently is medically stable to d/c.   Difficult to place patient No   Antimicrobials:  Anti-infectives (From admission, onward)    Start     Dose/Rate Route Frequency Ordered Stop   12/13/20 1000  remdesivir 100 mg in sodium chloride 0.9 % 100 mL IVPB       See Hyperspace for full Linked Orders Report.   100 mg 200 mL/hr over 30 Minutes Intravenous Daily 12/12/20 0753 12/14/20 1054   12/12/20 1000  remdesivir 200 mg in sodium chloride 0.9% 250 mL IVPB       See Hyperspace for full Linked Orders Report.   200 mg 580 mL/hr over 30 Minutes Intravenous Once 12/12/20 0753 12/12/20 1319          Subjective: Cough appear improved today  Objective: Vitals:   12/15/20 0528 12/15/20 0824 12/15/20 0831 12/15/20 1155  BP: (!) 169/74  (!) 149/62 (!) 160/86  Pulse: 65 76 76 80  Resp: 16  20 20   Temp: 97.9 F (36.6 C) 98.1 F (36.7 C) 98.1 F (36.7 C) (!) 97 F (36.1 C)  TempSrc: Axillary Oral Oral Oral  SpO2: 100% 94% 97% 98%  Weight:      Height:        Intake/Output Summary (Last 24 hours) at 12/15/2020 1256 Last data filed at 12/15/2020 1100 Gross per 24 hour  Intake 5 ml  Output 600 ml  Net -595 ml   Filed Weights   12/11/20 1635 12/13/20 0549  Weight: 75.3 kg 61.8 kg    Examination:   General: Appearance:   Elderly female in no acute distress     Lungs:     respirations unlabored  Heart:    Normal heart rate.    MS:   All extremities are intact.    Neurologic:   Awake, alert, oriented x 3.         Data Reviewed: I have personally reviewed following labs and imaging studies  CBC: Recent Labs  Lab 12/11/20 2347 12/12/20 1051 12/13/20 0245 12/14/20 0335 12/15/20 0336  WBC 7.8 8.4 7.8 6.3 6.2  NEUTROABS 5.5 6.0 4.6 3.2 3.3  HGB 10.6* 11.4* 10.0* 8.9* 8.9*  HCT 33.7* 35.5* 32.1* 28.5*  28.3*  MCV 78.2* 78.4* 77.9* 78.5* 78.6*  PLT 233 211 227 213 247   Basic Metabolic Panel: Recent Labs  Lab 12/11/20 2347 12/12/20 1051 12/13/20 0245 12/14/20 0335 12/15/20 0336  NA 136 134* 138 137 141  K 4.2 4.9 3.3* 3.3* 3.6  CL 97* 97* 101 102 103  CO2 30 27 30 28 30   GLUCOSE 111* 103* 72 90 100*  BUN 11 9 12 21 20   CREATININE 0.61 0.55 0.63 0.64 0.72  CALCIUM 9.1 8.9 8.7* 8.2* 8.9  MG  --   --  1.9 1.9 1.9   GFR: Estimated Creatinine Clearance: 45.5 mL/min (by C-G formula based on SCr of 0.72 mg/dL). Liver Function Tests: Recent Labs  Lab 12/11/20 2347 12/12/20 1051 12/13/20 0245 12/14/20 0335 12/15/20 0336  AST 36 41 27 29 28   ALT 19 21 20 22 22   ALKPHOS 65 65 58 57 55  BILITOT 1.1 1.9* 0.5 0.5 0.4  PROT 6.9 7.3 6.4* 5.6* 5.6*  ALBUMIN 3.6 3.6 3.3* 2.8* 2.9*   No results for input(s): LIPASE, AMYLASE in the last 168 hours. No results for input(s): AMMONIA in the last 168 hours. Coagulation Profile: No results for input(s): INR, PROTIME in the last 168 hours. Cardiac Enzymes: No results for input(s): CKTOTAL, CKMB, CKMBINDEX, TROPONINI in the last 168 hours. BNP (last 3 results) No results for input(s): PROBNP in the last 8760 hours. HbA1C: No results for input(s): HGBA1C in the last 72 hours.  CBG: Recent Labs  Lab 12/14/20 1149 12/14/20 1610 12/14/20 2111 12/15/20 0816 12/15/20 1153  GLUCAP 230* 117* 147* 102* 134*   Lipid Profile: No results for input(s): CHOL, HDL, LDLCALC, TRIG, CHOLHDL, LDLDIRECT in the last 72 hours. Thyroid Function Tests: No results for input(s): TSH, T4TOTAL, FREET4, T3FREE, THYROIDAB in the last 72 hours. Anemia Panel: No results for input(s): VITAMINB12, FOLATE, FERRITIN, TIBC, IRON, RETICCTPCT in the last 72 hours.  Sepsis Labs: Recent Labs  Lab 12/11/20 2347 12/12/20 1051  PROCALCITON <0.10 <0.10    Recent Results (from the past 240 hour(s))  Resp Panel by RT-PCR (Flu A&B, Covid) Nasopharyngeal Swab      Status: Abnormal   Collection Time: 12/12/20  2:00 AM   Specimen: Nasopharyngeal Swab; Nasopharyngeal(NP) swabs in vial transport medium  Result Value Ref Range Status   SARS Coronavirus 2 by RT PCR POSITIVE (A) NEGATIVE Final    Comment: RESULT CALLED TO, READ BACK BY AND VERIFIED WITH: RN TABITHA AT 0345 12/12/20 CRUICKSHANK A (NOTE) SARS-CoV-2 target nucleic acids are DETECTED.  The SARS-CoV-2 RNA is generally detectable in upper respiratory specimens during the acute phase of infection. Positive results are indicative of the presence of the identified  virus, but do not rule out bacterial infection or co-infection with other pathogens not detected by the test. Clinical correlation with patient history and other diagnostic information is necessary to determine patient infection status. The expected result is Negative.  Fact Sheet for Patients: BloggerCourse.com  Fact Sheet for Healthcare Providers: SeriousBroker.it  This test is not yet approved or cleared by the [place] FDA and  has been authorized for detection and/or diagnosis of SARS-CoV-2 by FDA under an Emergency Use Authorization (EUA).  This EUA will remain in effect (meaning this test  can be used) for the duration of  the COVID-19 declaration under Section 564(b)(1) of the Act, 21 U.S.C. section 360bbb-3(b)(1), unless the authorization is terminated or revoked sooner.     Influenza A by PCR NEGATIVE NEGATIVE Final   Influenza B by PCR NEGATIVE NEGATIVE Final    Comment: (NOTE) The Xpert Xpress SARS-CoV-2/FLU/RSV plus assay is intended as an aid in the diagnosis of influenza from Nasopharyngeal swab specimens and should not be used as a sole basis for treatment. Nasal washings and aspirates are unacceptable for Xpert Xpress SARS-CoV-2/FLU/RSV testing.  Fact Sheet for Patients: BloggerCourse.com  Fact Sheet for Healthcare  Providers: SeriousBroker.it  This test is not yet approved or cleared by the [place] FDA and has been authorized for detection and/or diagnosis of SARS-CoV-2 by FDA under an Emergency Use Authorization (EUA). This EUA will remain in effect (meaning this test can be used) for the duration of the COVID-19 declaration under Section 564(b)(1) of the Act, 21 U.S.C. section 360bbb-3(b)(1), unless the authorization is terminated or revoked.  Performed at [facility], [address] [place], [place] [address]       Radiology Studies: No results found.  Scheduled Meds:  amLODipine  5 mg Oral Daily   vitamin C  500 mg Oral Daily   atorvastatin  80 mg Oral Daily   donepezil  10 mg Oral QHS   enoxaparin (LOVENOX) injection  40 mg Subcutaneous Q24H   feeding supplement  237 mL Oral BID BM   insulin aspart  0-9 Units Subcutaneous TID AC & HS   lacosamide  150 mg Oral BID   levETIRAcetam  750 mg Oral BID   loratadine  10 mg Oral Daily   memantine  10 mg Oral BID   multivitamin with minerals  1 tablet Oral Daily   pantoprazole  40 mg Oral Daily   senna  1 tablet Oral BID   zinc sulfate  220 mg Oral Daily   Continuous Infusions:     LOS: 3 days   Time spent: 31 minutes   Joseph Art, DO [facility]  12/15/2020, 12:56 PM   How to contact the [facility] Attending or Consulting provider 7A - 7P or covering provider during after hours 7P -7A, for this patient?  Check the care team in [facility] and look for a) attending/consulting TRH provider listed and b) the [facility] team listed. Page or secure chat 7A-7P. Log into [email or web address] and use [place]'s universal password to access. If you do not have the password, please contact the hospital operator. Locate the [facility] provider you are looking for under [facility] and page to a number that you can be directly reached. If you still have difficulty reaching the provider, please page the  [facility] (Director on Call) for the Hospitalists listed on amion for assistance.

## 2020-12-15 NOTE — Care Management Important Message (Signed)
Important Message  Patient Details IM Letter placed in room for Daughter Tiffany Kocher. Name: Margaret Hernandez MRN: [id number] Date of Birth: [birth year]   Medicare Important Message Given:  Yes     Caren Macadam 12/15/2020, 11:31 AM

## 2020-12-16 LAB — CBC WITH DIFFERENTIAL/PLATELET
Abs Immature Granulocytes: 0.04 10*3/uL (ref 0.00–0.07)
Basophils Absolute: 0 10*3/uL (ref 0.0–0.1)
Basophils Relative: 0 %
Eosinophils Absolute: 0.1 10*3/uL (ref 0.0–0.5)
Eosinophils Relative: 2 %
HCT: 32.1 % — ABNORMAL LOW (ref 36.0–46.0)
Hemoglobin: 10 g/dL — ABNORMAL LOW (ref 12.0–15.0)
Immature Granulocytes: 1 %
Lymphocytes Relative: 40 %
Lymphs Abs: 2.4 10*3/uL (ref 0.7–4.0)
MCH: 24.7 pg — ABNORMAL LOW (ref 26.0–34.0)
MCHC: 31.2 g/dL (ref 30.0–36.0)
MCV: 79.3 fL — ABNORMAL LOW (ref 80.0–100.0)
Monocytes Absolute: 0.9 10*3/uL (ref 0.1–1.0)
Monocytes Relative: 15 %
Neutro Abs: 2.5 10*3/uL (ref 1.7–7.7)
Neutrophils Relative %: 42 %
Platelets: 279 10*3/uL (ref 150–400)
RBC: 4.05 MIL/uL (ref 3.87–5.11)
RDW: 13.7 % (ref 11.5–15.5)
WBC: 5.9 10*3/uL (ref 4.0–10.5)
nRBC: 0 % (ref 0.0–0.2)

## 2020-12-16 LAB — COMPREHENSIVE METABOLIC PANEL
ALT: 28 U/L (ref 0–44)
AST: 37 U/L (ref 15–41)
Albumin: 2.9 g/dL — ABNORMAL LOW (ref 3.5–5.0)
Alkaline Phosphatase: 66 U/L (ref 38–126)
Anion gap: 6 (ref 5–15)
BUN: 14 mg/dL (ref 8–23)
CO2: 30 mmol/L (ref 22–32)
Calcium: 8.9 mg/dL (ref 8.9–10.3)
Chloride: 102 mmol/L (ref 98–111)
Creatinine, Ser: 0.51 mg/dL (ref 0.44–1.00)
GFR, Estimated: >60 mL/min (ref >60)
Glucose, Bld: 105 mg/dL — ABNORMAL HIGH (ref 70–99)
Potassium: 3.7 mmol/L (ref 3.5–5.1)
Sodium: 138 mmol/L (ref 135–145)
Total Bilirubin: 0.3 mg/dL (ref 0.3–1.2)
Total Protein: 5.9 g/dL — ABNORMAL LOW (ref 6.5–8.1)

## 2020-12-16 LAB — C-REACTIVE PROTEIN: CRP: 2.1 mg/dL — ABNORMAL HIGH (ref <1.0)

## 2020-12-16 LAB — GLUCOSE, CAPILLARY
Glucose-Capillary: 106 mg/dL — ABNORMAL HIGH (ref 70–99)
Glucose-Capillary: 201 mg/dL — ABNORMAL HIGH (ref 70–99)
Glucose-Capillary: 150 mg/dL — ABNORMAL HIGH (ref 70–99)
Glucose-Capillary: 96 mg/dL (ref 70–99)

## 2020-12-16 LAB — MAGNESIUM: Magnesium: 1.8 mg/dL (ref 1.7–2.4)

## 2020-12-16 LAB — D-DIMER, QUANTITATIVE: D-Dimer, Quant: 0.52 ug/mL-FEU — ABNORMAL HIGH (ref 0.00–0.50)

## 2020-12-16 NOTE — NC FL2 (Signed)
[place] MEDICAID FL2 LEVEL OF CARE SCREENING TOOL     IDENTIFICATION  Patient Name: Margaret Hernandez Birthdate: 02/05/1930 Sex: female Admission Date (Current Location): 12/11/2020  [facility] and [place] Number:  Producer, television/film/video and Address:  [facility],  [address], [place] [address]      Provider Number:    Attending Physician Name and Address:  Joseph Art, DO  Relative Name and Phone Number:  Lee,[name] Daughter [phone]  [phone]  Lee,Star Granddaughter [phone]  [phone]  Emy, Angevine Son   [phone]    Current Level of Care: Hospital Recommended Level of Care: Skilled Nursing Facility Prior Approval Number:    Date Approved/Denied:   PASRR Number: [id number]  Discharge Plan: SNF    Current Diagnoses: Patient Active Problem List   Diagnosis Date Noted   COVID-19 virus infection 12/12/2020   Mixed diabetic hyperlipidemia associated with type 2 diabetes mellitus (HCC) 12/12/2020   Acute metabolic encephalopathy 12/12/2020   Severe protein-calorie malnutrition (HCC) 12/12/2020   AKI (acute kidney injury) (HCC) 07/08/2020   Physical deconditioning 07/08/2020   Lumbar stenosis 07/08/2020   Hypokalemia 07/08/2020   Microcytic anemia 07/08/2020   Awareness alteration, transient 08/28/2014   Seizure disorder (HCC) 08/28/2014   Fall due to seizure [facility])    Seizure (HCC)    Status epilepticus (HCC) 04/23/2014   Status epilepticus, generalized convulsive (HCC) 04/23/2014   Diabetes type 2, uncontrolled (HCC) 12/24/2013   Dementia with behavioral disturbance (HCC) 12/24/2013   Diabetes type 2, controlled (HCC) 09/12/2013   GERD without esophagitis 09/12/2013   Hyperlipemia 09/12/2013   Arthritis 10/23/2010   HBP (high blood pressure) 10/23/2010    Orientation RESPIRATION BLADDER Height & Weight     Self, Place  Normal Incontinent Weight: 136 lb 3.9 oz (61.8 kg) Height:  5\' 6"  (167.6 cm)  BEHAVIORAL  SYMPTOMS/MOOD NEUROLOGICAL BOWEL NUTRITION STATUS      Incontinent Diet  AMBULATORY STATUS COMMUNICATION OF NEEDS Skin   Limited Assist Verbally Normal                       Personal Care Assistance Level of Assistance  Bathing, Feeding, Dressing Bathing Assistance: Limited assistance Feeding assistance: Limited assistance Dressing Assistance: Limited assistance     Functional Limitations Info  Sight, Hearing, Speech Sight Info: Adequate Hearing Info: Adequate Speech Info: Adequate    SPECIAL CARE FACTORS FREQUENCY  PT (By licensed PT), OT (By licensed OT)     PT Frequency: Minimum 5x a week OT Frequency: Minimum 5x a week            Contractures Contractures Info: Not present    Additional Factors Info  Code Status, Allergies, Insulin Sliding Scale, Isolation Precautions Code Status Info: DNR Allergies Info: NKA   Insulin Sliding Scale Info: insulin aspart (novoLOG) injection 0-9 Units 3x a day with meals Isolation Precautions Info: Covid+ Air/Con precautions     Current Medications (12/16/2020):  This is the current hospital active medication list Current Facility-Administered Medications  Medication Dose Route Frequency Provider Last Rate Last Admin   acetaminophen (TYLENOL) tablet 650 mg  650 mg Oral Q6H PRN 12/18/2020, MD   650 mg at 12/14/20 2135   albuterol (VENTOLIN HFA) 108 (90 Base) MCG/ACT inhaler 2 puff  2 puff Inhalation Q4H PRN Shalhoub, 2136, MD       amLODipine (NORVASC) tablet 10 mg  10 mg Oral Daily Vann, Jessica U, DO   10  mg at 12/16/20 0948   ascorbic acid (VITAMIN C) tablet 500 mg  500 mg Oral Daily Shalhoub, Deno Lunger, MD   500 mg at 12/16/20 5409   atorvastatin (LIPITOR) tablet 80 mg  80 mg Oral Daily Marinda Elk, MD   80 mg at 12/16/20 8119   benzonatate (TESSALON) capsule 100 mg  100 mg Oral TID PRN Marlin Canary U, DO   100 mg at 12/15/20 1818   donepezil (ARICEPT) tablet 10 mg  10 mg Oral QHS Marinda Elk, MD    10 mg at 12/15/20 2134   enoxaparin (LOVENOX) injection 40 mg  40 mg Subcutaneous Q24H Marinda Elk, MD   40 mg at 12/16/20 1478   feeding supplement (ENSURE ENLIVE / ENSURE PLUS) liquid 237 mL  237 mL Oral BID BM Pahwani, Daleen Bo, MD   237 mL at 12/15/20 1357   guaiFENesin-dextromethorphan (ROBITUSSIN DM) 100-10 MG/5ML syrup 10 mL  10 mL Oral Q4H PRN Marinda Elk, MD   10 mL at 12/15/20 1356   hydrALAZINE (APRESOLINE) injection 10 mg  10 mg Intravenous Q6H PRN Hughie Closs, MD   10 mg at 12/12/20 1336   insulin aspart (novoLOG) injection 0-9 Units  0-9 Units Subcutaneous TID AC & HS Shalhoub, Deno Lunger, MD   1 Units at 12/15/20 2133   lacosamide (VIMPAT) tablet 150 mg  150 mg Oral BID Marinda Elk, MD   150 mg at 12/16/20 0921   levETIRAcetam (KEPPRA) tablet 750 mg  750 mg Oral BID Marinda Elk, MD   750 mg at 12/16/20 0947   loratadine (CLARITIN) tablet 10 mg  10 mg Oral Daily Shalhoub, Deno Lunger, MD   10 mg at 12/16/20 0948   memantine (NAMENDA) tablet 10 mg  10 mg Oral BID Marinda Elk, MD   10 mg at 12/16/20 0949   multivitamin with minerals tablet 1 tablet  1 tablet Oral Daily Hughie Closs, MD   1 tablet at 12/16/20 0921   ondansetron (ZOFRAN) tablet 4 mg  4 mg Oral Q6H PRN Marinda Elk, MD       Or   ondansetron [facility]) injection 4 mg  4 mg Intravenous Q6H PRN Shalhoub, Deno Lunger, MD       pantoprazole (PROTONIX) EC tablet 40 mg  40 mg Oral Daily Vann, Jessica U, DO   40 mg at 12/16/20 0947   polyethylene glycol (MIRALAX / GLYCOLAX) packet 17 g  17 g Oral Daily PRN Shalhoub, Deno Lunger, MD       senna (SENOKOT) tablet 8.6 mg  1 tablet Oral BID Marinda Elk, MD   8.6 mg at 12/16/20 0947   zinc sulfate capsule 220 mg  220 mg Oral Daily Shalhoub, Deno Lunger, MD   220 mg at 12/16/20 2956     Discharge Medications: Please see discharge summary for a list of discharge medications.  Relevant Imaging Results:  Relevant Lab Results:   Additional  Information SSN [id number]  Darleene Cleaver, LCSW

## 2020-12-16 NOTE — Progress Notes (Signed)
Physical Therapy Treatment Patient Details Name: Margaret Hernandez MRN: [id number] DOB: May 03, 1930 Today's Date: 12/16/2020    History of Present Illness Admitted for COVID-68. 87 year old female with past medical history of dementia, seizure disorder, hypertension, hyperlipidemia, diabetes mellitus type 2 who presents to [facility] long hospital emergency department from her provider's office at the [facility] program due to concerns over new diagnosis of COVID and associated hypoxia.    PT Comments    Patient resting in bed, pulling on hand mitts. Patient required mod assistance , hand over hand cues to sit up. +2 mod/max assist to stand and pivot to recliner holding  Bilateral UE's. Patient  with frequent cough. Tolerated well.   Follow Up Recommendations  SNF     Equipment Recommendations  None recommended by PT    Recommendations for Other Services       Precautions / Restrictions Precautions Precautions: Fall    Mobility  Bed Mobility   Bed Mobility: Supine to Sit     Supine to sit: Max assist     General bed mobility comments: multimodal cues to move legs and sit up, patient  required max assist    Transfers Overall transfer level: Needs assistance Equipment used: 2 person hand held assist Transfers: Sit to/from Stand;Stand Pivot Transfers Sit to Stand: Mod assist Stand pivot transfers: Mod assist       General transfer comment: mod A +2 for rising to stand and pivot to recliner.  stong  posterior lean and maintains flexed trunk, transfers with fair carryover  Ambulation/Gait                 Stairs             Wheelchair Mobility    Modified Rankin (Stroke Patients Only)       Balance Overall balance assessment: Needs assistance Sitting-balance support: Feet supported Sitting balance-Leahy Scale: Fair Sitting balance - Comments: posterior lean   Standing balance support: During functional activity;Bilateral upper extremity supported Standing  balance-Leahy Scale: Poor Standing balance comment: mod A with BUE supported, strong posterior lean despite cues                            Cognition Arousal/Alertness: Awake/alert Behavior During Therapy: Restless Overall Cognitive Status: Difficult to assess Area of Impairment: Orientation;Attention;Memory;Following commands;Awareness                 Orientation Level: Place;Time;Situation Current Attention Level: Focused   Following Commands: Follows one step commands inconsistently   Awareness: Intellectual   General Comments: Pt responds to name, decreased following  directions. Patient restless with mitts, trying to remove them.      Exercises      General Comments        Pertinent Vitals/Pain Faces Pain Scale: No hurt    Home Living                      Prior Function            PT Goals (current goals can now be found in the care plan section) Progress towards PT goals: Progressing toward goals    Frequency    Min 2X/week      PT Plan Current plan remains appropriate    Co-evaluation              AM-PAC PT "6 Clicks" Mobility   Outcome Measure  Help needed turning from your back to  your side while in a flat bed without using bedrails?: A Lot Help needed moving from lying on your back to sitting on the side of a flat bed without using bedrails?: A Lot Help needed moving to and from a bed to a chair (including a wheelchair)?: A Lot Help needed standing up from a chair using your arms (e.g., wheelchair or bedside chair)?: A Lot Help needed to walk in hospital room?: Total Help needed climbing 3-5 steps with a railing? : Total 6 Click Score: 10    End of Session   Activity Tolerance: Patient tolerated treatment well Patient left: in chair;with call bell/phone within reach;with nursing/sitter in room Nurse Communication: Mobility status;Other (comment) PT Visit Diagnosis: Unsteadiness on feet (R26.81);Other  abnormalities of gait and mobility (R26.89);Muscle weakness (generalized) (M62.81)     Time: 6834-1962 PT Time Calculation (min) (ACUTE ONLY): 23 min  Charges:  $Therapeutic Activity: 23-37 mins                     Blanchard Kelch PT Acute Rehabilitation Services Pager [phone] Office [phone]    Rada Hay 12/16/2020, 3:52 PM

## 2020-12-16 NOTE — TOC Initial Note (Addendum)
Transition of Care [facility]) - Initial/Assessment Note    Patient Details  Name: Margaret Hernandez MRN: [id number] Date of Birth: 1929-06-19  Transition of Care [facility]) CM/SW Contact:    Darleene Cleaver, LCSW Phone Number: 12/16/2020, 1:42 PM  Clinical Narrative:                  Patient is an 87 year old female who is a member of [employer].  Patient lives with family, and uses [facility] for day program.  PT worked with patient and recommended SNF for rehab.  CSW spoke to [employer] at [facility] [employer] [phone].  Patient will need to go to [facility] because that is where they have a contract with and they have a Covid+  room available.  CSW spoke to [facility] admissions worker and they can accept patient tomorrow if she is medically ready for discharge.  CSW updated attending physician.  CSW to continue to follow patient's progress throughout discharge planning.  Expected Discharge Plan: Skilled Nursing Facility Barriers to Discharge: Continued Medical Work up   Patient Goals and CMS Choice Patient states their goals for this hospitalization and ongoing recovery are:: To go to SNF then return back home. CMS Medicare.gov Compare Post Acute Care list provided to:: Patient Represenative (must comment) Choice offered to / list presented to :  (Child psychotherapist at [facility])  Expected Discharge Plan and Services Expected Discharge Plan: Skilled Nursing Facility       Living arrangements for the past 2 months: Single Family Home                                      Prior Living Arrangements/Services Living arrangements for the past 2 months: Single Family Home Lives with:: Adult Children Patient language and need for interpreter reviewed:: Yes Do you feel safe going back to the place where you live?: No   Patient needs SNF for rehab before returning back home.  Need for Family Participation in Patient Care: Yes (Comment) Care giver support system in place?: Yes (comment)   Criminal  Activity/Legal Involvement Pertinent to Current Situation/Hospitalization: No - Comment as needed  Activities of Daily Living Home Assistive Devices/Equipment: Cane (specify quad or straight), Eyeglasses, Walker (specify type) (single point cane, front wheeled walker) ADL Screening (condition at time of admission) Patient's cognitive ability adequate to safely complete daily activities?: No Is the patient deaf or have difficulty hearing?: No Does the patient have difficulty seeing, even when wearing glasses/contacts?: Yes Does the patient have difficulty concentrating, remembering, or making decisions?: Yes Patient able to express need for assistance with ADLs?: No Does the patient have difficulty dressing or bathing?: Yes Independently performs ADLs?: No Communication: Independent Dressing (OT): Needs assistance Is this a change from baseline?: Change from baseline, expected to last >3 days Grooming: Needs assistance Is this a change from baseline?: Change from baseline, expected to last >3 days Feeding: Needs assistance Is this a change from baseline?: Change from baseline, expected to last >3 days Bathing: Needs assistance Is this a change from baseline?: Change from baseline, expected to last >3 days Toileting: Needs assistance Is this a change from baseline?: Change from baseline, expected to last >3days In/Out Bed: Needs assistance Is this a change from baseline?: Change from baseline, expected to last >3 days Walks in Home: Needs assistance Is this a change from baseline?: Change from baseline, expected to last >3 days  Does the patient have difficulty walking or climbing stairs?: Yes (secondary to weakness and shortness of breath) Weakness of Legs: Both Weakness of Arms/Hands: Both  Permission Sought/Granted Permission sought to share information with : Facility Medical sales representative, Family Supports Permission granted to share information with : Yes, Release of Information  Signed  Share Information with NAME: Lee,[name] Daughter [phone]  [phone]  Lee,Star Granddaughter [phone]  [phone]  [place], [place]   [phone]  Permission granted to share info w AGENCY: SNF admissions        Emotional Assessment Appearance:: Appears stated age   Affect (typically observed): Appropriate, Accepting, Calm, Stable Orientation: : Oriented to Self, Oriented to Place Alcohol / Substance Use: Not Applicable Psych Involvement: No (comment)  Admission diagnosis:  Confusion [R41.0] Failure to thrive (child) [R62.51] Acute metabolic encephalopathy [G93.41] Dementia without behavioral disturbance, unspecified dementia type (HCC) [F03.90] COVID-19 virus infection [U07.1] COVID-19 [U07.1] Patient Active Problem List   Diagnosis Date Noted   COVID-19 virus infection 12/12/2020   Mixed diabetic hyperlipidemia associated with type 2 diabetes mellitus (HCC) 12/12/2020   Acute metabolic encephalopathy 12/12/2020   Severe protein-calorie malnutrition (HCC) 12/12/2020   AKI (acute kidney injury) (HCC) 07/08/2020   Physical deconditioning 07/08/2020   Lumbar stenosis 07/08/2020   Hypokalemia 07/08/2020   Microcytic anemia 07/08/2020   Awareness alteration, transient 08/28/2014   Seizure disorder (HCC) 08/28/2014   Fall due to seizure [facility])    Seizure (HCC)    Status epilepticus (HCC) 04/23/2014   Status epilepticus, generalized convulsive (HCC) 04/23/2014   Diabetes type 2, uncontrolled (HCC) 12/24/2013   Dementia with behavioral disturbance (HCC) 12/24/2013   Diabetes type 2, controlled (HCC) 09/12/2013   GERD without esophagitis 09/12/2013   Hyperlipemia 09/12/2013   Arthritis 10/23/2010   HBP (high blood pressure) 10/23/2010   PCP:  [facility]:   [facility] - [place], [place] - [address] [place] [address] Phone: [phone] Fax:  [phone]     Social Determinants of Health (SDOH) Interventions    Readmission Risk Interventions No flowsheet data found.

## 2020-12-16 NOTE — Progress Notes (Addendum)
PROGRESS NOTE    Margaret Hernandez  IRJ:[id number] DOB: [birth year] DOA: 12/11/2020 PCP: [facility]   Brief Narrative:  This is a pleasant 87 year old lady with many medical problems who was admitted under hospital service due to acute metabolic encephalopathy and hypoxia as well as COVID infection.  Reportedly, she was hypoxic at the PCPs clinic however Patient is saturating well over 98% on room air while here and has not required any oxygen.  Chest x-ray negative for any acute pathology.  CT head negative for any acute pathology as well.  CRP minimally elevated.  Procalcitonin normal.  D-dimer normal.  She has been started on remdesivir for 3 days.  Patient's encephalopathy has resolved to her baseline.  Seen by PT/OT and they recommend SNF.   Assessment & Plan:   Principal Problem:   COVID-19 virus infection Active Problems:   Dementia with behavioral disturbance (HCC)   Seizure disorder (HCC)   GERD without esophagitis   Mixed diabetic hyperlipidemia associated with type 2 diabetes mellitus (HCC)   Acute metabolic encephalopathy   Severe protein-calorie malnutrition (HCC)   COVID-19 viral infection:  -No fever or hypoxia and chest x-ray with no findings regarding COVID.   -completed 3 days of remdesivir -needs continued pulmonary toilet   Acute metabolic encephalopathy/advanced dementia: Likely secondary to COVID infection.  She also has advanced dementia.   -this is her baseline.  Continue home medications.  Seizure disorder: Continue home antiepileptic medications.  Dyslipidemia: Continue home statin.  Severe protein calorie malnutrition: Nutrition consultation is already placed.  Essential hypertension: Slightly limited, much better than yesterday.  Continue home medications.  Type 2 diabetes mellitus: Controlled.  Continue SSI.  DVT prophylaxis: enoxaparin (LOVENOX) injection 40 mg Start: 12/12/20 1000   Code Status: DNR    Status  is: Inpatient  Remains inpatient appropriate because:Unsafe d/c plan  Dispo: The patient is from: Home              Anticipated d/c is to: SNF in the AM              Patient currently is medically stable to d/c.   Difficult to place patient No   Antimicrobials:  Anti-infectives (From admission, onward)    Start     Dose/Rate Route Frequency Ordered Stop   12/13/20 1000  remdesivir 100 mg in sodium chloride 0.9 % 100 mL IVPB       See Hyperspace for full Linked Orders Report.   100 mg 200 mL/hr over 30 Minutes Intravenous Daily 12/12/20 0753 12/14/20 1054   12/12/20 1000  remdesivir 200 mg in sodium chloride 0.9% 250 mL IVPB       See Hyperspace for full Linked Orders Report.   200 mg 580 mL/hr over 30 Minutes Intravenous Once 12/12/20 0753 12/12/20 1319          Subjective: Hungry this AM, has not been fed breakfast yet  Objective: Vitals:   12/16/20 0600 12/16/20 0700 12/16/20 0800 12/16/20 1000  BP:      Pulse:      Resp: 15 (!) 21 13 14   Temp:    99 F (37.2 C)  TempSrc:      SpO2:    100%  Weight:      Height:        Intake/Output Summary (Last 24 hours) at 12/16/2020 1106 Last data filed at 12/16/2020 1000 Gross per 24 hour  Intake 170 ml  Output 1400 ml  Net -  1230 ml   Filed Weights   12/11/20 1635 12/13/20 0549  Weight: 75.3 kg 61.8 kg    Examination:    General: Appearance:    Elderly female in no acute distress     Lungs:      respirations unlabored  Heart:    Normal heart rate.     MS:   All extremities are intact.    Neurologic:   Awake, alert, pleasant          Data Reviewed: I have personally reviewed following labs and imaging studies  CBC: Recent Labs  Lab 12/12/20 1051 12/13/20 0245 12/14/20 0335 12/15/20 0336 12/16/20 0335  WBC 8.4 7.8 6.3 6.2 5.9  NEUTROABS 6.0 4.6 3.2 3.3 2.5  HGB 11.4* 10.0* 8.9* 8.9* 10.0*  HCT 35.5* 32.1* 28.5* 28.3* 32.1*  MCV 78.4* 77.9* 78.5* 78.6* 79.3*  PLT 211 227 213 247 279   Basic  Metabolic Panel: Recent Labs  Lab 12/12/20 1051 12/13/20 0245 12/14/20 0335 12/15/20 0336 12/16/20 0335  NA 134* 138 137 141 138  K 4.9 3.3* 3.3* 3.6 3.7  CL 97* 101 102 103 102  CO2 27 30 28 30 30   GLUCOSE 103* 72 90 100* 105*  BUN 9 12 21 20 14   CREATININE 0.55 0.63 0.64 0.72 0.51  CALCIUM 8.9 8.7* 8.2* 8.9 8.9  MG  --  1.9 1.9 1.9 1.8   GFR: Estimated Creatinine Clearance: 45.5 mL/min (by C-G formula based on SCr of 0.51 mg/dL). Liver Function Tests: Recent Labs  Lab 12/12/20 1051 12/13/20 0245 12/14/20 0335 12/15/20 0336 12/16/20 0335  AST 41 27 29 28  37  ALT 21 20 22 22 28   ALKPHOS 65 58 57 55 66  BILITOT 1.9* 0.5 0.5 0.4 0.3  PROT 7.3 6.4* 5.6* 5.6* 5.9*  ALBUMIN 3.6 3.3* 2.8* 2.9* 2.9*   No results for input(s): LIPASE, AMYLASE in the last 168 hours. No results for input(s): AMMONIA in the last 168 hours. Coagulation Profile: No results for input(s): INR, PROTIME in the last 168 hours. Cardiac Enzymes: No results for input(s): CKTOTAL, CKMB, CKMBINDEX, TROPONINI in the last 168 hours. BNP (last 3 results) No results for input(s): PROBNP in the last 8760 hours. HbA1C: No results for input(s): HGBA1C in the last 72 hours.  CBG: Recent Labs  Lab 12/15/20 0816 12/15/20 1153 12/15/20 1750 12/15/20 2042 12/16/20 0732  GLUCAP 102* 134* 184* 122* 106*   Lipid Profile: No results for input(s): CHOL, HDL, LDLCALC, TRIG, CHOLHDL, LDLDIRECT in the last 72 hours. Thyroid Function Tests: No results for input(s): TSH, T4TOTAL, FREET4, T3FREE, THYROIDAB in the last 72 hours. Anemia Panel: No results for input(s): VITAMINB12, FOLATE, FERRITIN, TIBC, IRON, RETICCTPCT in the last 72 hours.  Sepsis Labs: Recent Labs  Lab 12/11/20 2347 12/12/20 1051  PROCALCITON <0.10 <0.10    Recent Results (from the past 240 hour(s))  Resp Panel by RT-PCR (Flu A&B, Covid) Nasopharyngeal Swab     Status: Abnormal   Collection Time: 12/12/20  2:00 AM   Specimen:  Nasopharyngeal Swab; Nasopharyngeal(NP) swabs in vial transport medium  Result Value Ref Range Status   SARS Coronavirus 2 by RT PCR POSITIVE (A) NEGATIVE Final    Comment: RESULT CALLED TO, READ BACK BY AND VERIFIED WITH: RN TABITHA AT 0345 12/12/20 CRUICKSHANK A (NOTE) SARS-CoV-2 target nucleic acids are DETECTED.  The SARS-CoV-2 RNA is generally detectable in upper respiratory specimens during the acute phase of infection. Positive results are indicative of the presence of the identified  virus, but do not rule out bacterial infection or co-infection with other pathogens not detected by the test. Clinical correlation with patient history and other diagnostic information is necessary to determine patient infection status. The expected result is Negative.  Fact Sheet for Patients: BloggerCourse.com  Fact Sheet for Healthcare Providers: SeriousBroker.it  This test is not yet approved or cleared by the [place] FDA and  has been authorized for detection and/or diagnosis of SARS-CoV-2 by FDA under an Emergency Use Authorization (EUA).  This EUA will remain in effect (meaning this test  can be used) for the duration of  the COVID-19 declaration under Section 564(b)(1) of the Act, 21 U.S.C. section 360bbb-3(b)(1), unless the authorization is terminated or revoked sooner.     Influenza A by PCR NEGATIVE NEGATIVE Final   Influenza B by PCR NEGATIVE NEGATIVE Final    Comment: (NOTE) The Xpert Xpress SARS-CoV-2/FLU/RSV plus assay is intended as an aid in the diagnosis of influenza from Nasopharyngeal swab specimens and should not be used as a sole basis for treatment. Nasal washings and aspirates are unacceptable for Xpert Xpress SARS-CoV-2/FLU/RSV testing.  Fact Sheet for Patients: BloggerCourse.com  Fact Sheet for Healthcare Providers: SeriousBroker.it  This test is not yet  approved or cleared by the [place] FDA and has been authorized for detection and/or diagnosis of SARS-CoV-2 by FDA under an Emergency Use Authorization (EUA). This EUA will remain in effect (meaning this test can be used) for the duration of the COVID-19 declaration under Section 564(b)(1) of the Act, 21 U.S.C. section 360bbb-3(b)(1), unless the authorization is terminated or revoked.  Performed at [facility], [address] [place], [place] [address]       Radiology Studies: No results found.  Scheduled Meds:  amLODipine  10 mg Oral Daily   vitamin C  500 mg Oral Daily   atorvastatin  80 mg Oral Daily   donepezil  10 mg Oral QHS   enoxaparin (LOVENOX) injection  40 mg Subcutaneous Q24H   feeding supplement  237 mL Oral BID BM   insulin aspart  0-9 Units Subcutaneous TID AC & HS   lacosamide  150 mg Oral BID   levETIRAcetam  750 mg Oral BID   loratadine  10 mg Oral Daily   memantine  10 mg Oral BID   multivitamin with minerals  1 tablet Oral Daily   pantoprazole  40 mg Oral Daily   senna  1 tablet Oral BID   zinc sulfate  220 mg Oral Daily   Continuous Infusions:     LOS: 4 days   Time spent: 31 minutes   Joseph Art, DO [facility]  12/16/2020, 11:06 AM   How to contact the [facility] Attending or Consulting provider 7A - 7P or covering provider during after hours 7P -7A, for this patient?  Check the care team in [facility] and look for a) attending/consulting TRH provider listed and b) the [facility] team listed. Page or secure chat 7A-7P. Log into [email or web address] and use [place]'s universal password to access. If you do not have the password, please contact the hospital operator. Locate the [facility] provider you are looking for under [facility] and page to a number that you can be directly reached. If you still have difficulty reaching the provider, please page the [facility] (Director on Call) for the Hospitalists listed on amion for  assistance.

## 2020-12-16 NOTE — Progress Notes (Signed)
OT Cancellation Note  Patient Details Name: Margaret Hernandez MRN: [id number] DOB: 08-10-1929   Cancelled Treatment:    Reason Eval/Treat Not Completed: Patient declined, no reason specified  patient was calling out for daughter when therapist entered room.patient declined any assistance from therapist and to participate in tx.  patient reported she needed to have " a private conversation with my daughter". Will continue to follow.  Sharyn Blitz OTR/L, MS Acute Rehabilitation Department Office# [phone] Pager# [phone]  Margaret Hernandez 12/16/2020, 12:34 PM

## 2020-12-17 DIAGNOSIS — U071 COVID-19: Principal | ICD-10-CM

## 2020-12-17 LAB — COMPREHENSIVE METABOLIC PANEL
ALT: 29 U/L (ref 0–44)
AST: 34 U/L (ref 15–41)
Albumin: 3.2 g/dL — ABNORMAL LOW (ref 3.5–5.0)
Alkaline Phosphatase: 61 U/L (ref 38–126)
Anion gap: 10 (ref 5–15)
BUN: 19 mg/dL (ref 8–23)
CO2: 32 mmol/L (ref 22–32)
Calcium: 9.2 mg/dL (ref 8.9–10.3)
Chloride: 98 mmol/L (ref 98–111)
Creatinine, Ser: 0.95 mg/dL (ref 0.44–1.00)
GFR, Estimated: 58 mL/min — ABNORMAL LOW (ref >60)
Glucose, Bld: 98 mg/dL (ref 70–99)
Potassium: 3.9 mmol/L (ref 3.5–5.1)
Sodium: 140 mmol/L (ref 135–145)
Total Bilirubin: 0.5 mg/dL (ref 0.3–1.2)
Total Protein: 6 g/dL — ABNORMAL LOW (ref 6.5–8.1)

## 2020-12-17 LAB — GLUCOSE, CAPILLARY
Glucose-Capillary: 96 mg/dL (ref 70–99)
Glucose-Capillary: 106 mg/dL — ABNORMAL HIGH (ref 70–99)

## 2020-12-17 LAB — CBC WITH DIFFERENTIAL/PLATELET
Abs Immature Granulocytes: 0.03 10*3/uL (ref 0.00–0.07)
Basophils Absolute: 0 10*3/uL (ref 0.0–0.1)
Basophils Relative: 0 %
Eosinophils Absolute: 0.1 10*3/uL (ref 0.0–0.5)
Eosinophils Relative: 1 %
HCT: 32 % — ABNORMAL LOW (ref 36.0–46.0)
Hemoglobin: 10.1 g/dL — ABNORMAL LOW (ref 12.0–15.0)
Immature Granulocytes: 1 %
Lymphocytes Relative: 41 %
Lymphs Abs: 2.5 10*3/uL (ref 0.7–4.0)
MCH: 24.6 pg — ABNORMAL LOW (ref 26.0–34.0)
MCHC: 31.6 g/dL (ref 30.0–36.0)
MCV: 78 fL — ABNORMAL LOW (ref 80.0–100.0)
Monocytes Absolute: 0.9 10*3/uL (ref 0.1–1.0)
Monocytes Relative: 14 %
Neutro Abs: 2.7 10*3/uL (ref 1.7–7.7)
Neutrophils Relative %: 43 %
Platelets: 332 10*3/uL (ref 150–400)
RBC: 4.1 MIL/uL (ref 3.87–5.11)
RDW: 13.8 % (ref 11.5–15.5)
WBC: 6.2 10*3/uL (ref 4.0–10.5)
nRBC: 0 % (ref 0.0–0.2)

## 2020-12-17 LAB — MAGNESIUM: Magnesium: 1.8 mg/dL (ref 1.7–2.4)

## 2020-12-17 LAB — D-DIMER, QUANTITATIVE: D-Dimer, Quant: 1.47 ug/mL-FEU — ABNORMAL HIGH (ref 0.00–0.50)

## 2020-12-17 LAB — C-REACTIVE PROTEIN: CRP: 1 mg/dL — ABNORMAL HIGH (ref <1.0)

## 2020-12-17 NOTE — Progress Notes (Signed)
Occupational Therapy Treatment Patient Details Name: Margaret Hernandez MRN: [id number] DOB: 24-Jun-1929 Today's Date: 12/17/2020    History of present illness Admitted for COVID-85. 87 year old female with past medical history of dementia, seizure disorder, hypertension, hyperlipidemia, diabetes mellitus type 2 who presents to [facility] long hospital emergency department from her provider's office at the [facility] program due to concerns over new diagnosis of COVID and associated hypoxia.   OT comments  Patient was noted to be alert on this date with no calling out. Patient was noted to be restless with pulling at clothing and wires. Patient was max A for supine to sit on edge of bed with patient resistive to participation in ADL tasks. Patient was able to sit on edge of bed with min guard with BLE touching floor. Patient was able to hold cup with BUE to take small sips of medications nurse was providing. Patient would continue to benefit from skilled OT services at this time while admitted and after d/c to address noted deficits in order to improve overall safety and independence in ADLs.    Follow Up Recommendations  SNF    Equipment Recommendations  Other (comment) (defer to next level of care)    Recommendations for Other Services      Precautions / Restrictions         Mobility Bed Mobility         Supine to sit: Max assist     General bed mobility comments: multimodal cues to move legs and sit up, patient  required max assist    Transfers                      Balance     Sitting balance-Leahy Scale: Fair Sitting balance - Comments: with BLE on floor no UE support                                   ADL either performed or assessed with clinical judgement   ADL Overall ADL's : Needs assistance/impaired       Grooming Details (indicate cue type and reason): attempted to complete face washing sitting on edge of bed with patient resistive to hand over  hand with patient throwing wash cloth across the room.                                     Vision       Perception     Praxis      Cognition Arousal/Alertness: Awake/alert Behavior During Therapy: Restless Overall Cognitive Status: Difficult to assess Area of Impairment: Orientation;Attention;Memory;Following commands;Awareness                               General Comments: patient responds to name with decreased follow directions. patient was restless with pulling clothes and wires.        Exercises     Shoulder Instructions       General Comments      Pertinent Vitals/ Pain       Faces Pain Scale: No hurt  Home Living  Prior Functioning/Environment              Frequency  Min 2X/week        Progress Toward Goals  OT Goals(current goals can now be found in the care plan section)     Acute Rehab OT Goals Patient Stated Goal: yes mam OT Goal Formulation: Patient unable to participate in goal setting Time For Goal Achievement: 12/26/20 Potential to Achieve Goals: Fair  Plan Discharge plan remains appropriate    Co-evaluation                 AM-PAC OT "6 Clicks" Daily Activity     Outcome Measure   Help from another person eating meals?: A Lot Help from another person taking care of personal grooming?: Total Help from another person toileting, which includes using toliet, bedpan, or urinal?: Total Help from another person bathing (including washing, rinsing, drying)?: Total Help from another person to put on and taking off regular upper body clothing?: Total Help from another person to put on and taking off regular lower body clothing?: Total 6 Click Score: 7    End of Session    OT Visit Diagnosis: Muscle weakness (generalized) (M62.81)   Activity Tolerance Other (comment)   Patient Left in bed;Other (comment) (with nurse present)   Nurse  Communication Other (comment) (patients restless behaviors)        Time: 8101-7510 OT Time Calculation (min): 25 min  Charges: OT General Charges $OT Visit: 1 Visit OT Treatments $Therapeutic Activity: 23-37 mins  Sharyn Blitz OTR/L, MS Acute Rehabilitation Department Office# [phone] Pager# [phone]    [place] Guest Valarie Farace 12/17/2020, 12:17 PM

## 2020-12-17 NOTE — Discharge Summary (Signed)
Physician Discharge Summary  Margaret Hernandez HQI:[id number] DOB: 1929-08-17 DOA: 12/11/2020  PCP: [facility]  Admit date: 12/11/2020 Discharge date: 12/17/2020  Admitted From: Home Disposition: Skilled nursing facility  Recommendations for Outpatient Follow-up:  Follow up with PCP in 1-2 weeks Consult palliative care at the skilled nursing facility.  Home Health: Not applicable Equipment/Devices: Not applicable  Discharge Condition: Fair CODE STATUS: DNR/DNI Diet recommendation: Regular diet, assisted feeding, all-time aspiration precautions  Discharge summary: 87 year old female with multiple medical issues, advanced dementia, seizure disorder and severe protein calorie malnutrition brought to the emergency room with more confusion and reported hypoxia.  She was taken to primary care physician's office, she was noted to be more confused than usual and was sent to the ER.  She was diagnosed at the office with COVID-19 and reported hypoxia, however she was on room air in the hospital. Admitted to hospital with profound lethargy and difficulty assisting at home. In the emergency room repeat COVID-19 PCR was positive.  Chest x-ray unremarkable.  CT scan of the head was normal.  Initiated on intravenous volume resuscitation and admitted to the hospital.  Treated for following conditions.  COVID-19 viral infection: -No fever or hypoxia and chest x-ray with no findings regarding COVID.   -completed 3 days of remdesivir -needs continued pulmonary toilet, aspiration precautions. -Advised standard airborne precautions for 10 days after diagnosis or according to facility infection prevention protocol.   Acute metabolic encephalopathy/advanced dementia: Likely secondary to COVID infection.  She also has advanced dementia.   -Patient with advanced dementia and frailty.  This is probably end-stage. -Patient currently with well-controlled behavior.  She needs  assistance with all ADLs including feeding.  She barely eats all meals. -Patient is at end-stage of dementia, she will benefit with hospice level of care. -Discussed with family and they are agreeable to be seen by palliative care at skilled nursing facility to initiate palliation and hospice discussion.   Seizure disorder: Continue home antiepileptic medications.  No evidence of new seizure.   Dyslipidemia: Continue home statin.   Severe protein calorie malnutrition: Nutrition consultation is already placed.  Chronic and unlikely to be treated.   Essential hypertension: Resume amlodipine.  Type 2 diabetes mellitus: Controlled.  No indication to treat.  Will allow regular diet to avoid hypoglycemia and to avoid restricting calories.  Patient with advanced dementia and frailty.  Currently hemodynamically stable.  Can be transferred to a skilled level of care.  Will need ongoing discussion about palliation and hospice.  Discharge Diagnoses:  Principal Problem:   COVID-19 virus infection Active Problems:   Dementia with behavioral disturbance (HCC)   Seizure disorder (HCC)   GERD without esophagitis   Mixed diabetic hyperlipidemia associated with type 2 diabetes mellitus (HCC)   Acute metabolic encephalopathy   Severe protein-calorie malnutrition (HCC)    Discharge Instructions  Discharge Instructions     Diet general   Complete by: As directed    Discharge instructions   Complete by: As directed    Consult palliative care at SNF   Increase activity slowly   Complete by: As directed       Allergies as of 12/17/2020   No Known Allergies      Medication List     TAKE these medications    acetaminophen 325 MG tablet Commonly known as: TYLENOL Take 650 mg by mouth every 6 (six) hours as needed for mild pain or headache.   amLODipine 5 MG tablet Commonly  known as: NORVASC Take 5 mg by mouth daily.   atorvastatin 80 MG tablet Commonly known as: LIPITOR Take 80 mg  by mouth daily.   calcium citrate 950 (200 Ca) MG tablet Commonly known as: CALCITRATE - dosed in mg elemental calcium Take 200 mg of elemental calcium by mouth daily.   cholecalciferol 25 MCG (1000 UNIT) tablet Commonly known as: VITAMIN D3 Take 1,000 Units by mouth daily.   donepezil 10 MG tablet Commonly known as: ARICEPT Take 10 mg by mouth at bedtime.   fexofenadine 180 MG tablet Commonly known as: ALLEGRA Take 180 mg by mouth daily.   Lacosamide 150 MG Tabs Take 1 tablet (150 mg total) by mouth 2 (two) times daily.   levETIRAcetam 750 MG tablet Commonly known as: KEPPRA Take 1 tablet (750 mg total) by mouth 2 (two) times daily.   memantine 10 MG tablet Commonly known as: NAMENDA Take 10 mg by mouth 2 (two) times daily.   senna 8.6 MG Tabs tablet Commonly known as: SENOKOT Take 1 tablet by mouth 2 (two) times daily.        Contact information for after-discharge care     Destination     [facility] Preferred SNF .   Service: Skilled Nursing Contact information: [address] [place] [id number] [phone]                    No Known Allergies  Consultations: None   Procedures/Studies: DG Chest 2 View  Result Date: 12/14/2020 CLINICAL DATA:  Coughing.  Chest congestion. EXAM: CHEST - 2 VIEW COMPARISON:  July 18, 2018 FINDINGS: The heart, hila, and mediastinum are unremarkable. No pneumothorax. No focal infiltrate or overt edema. IMPRESSION: No active cardiopulmonary disease. Electronically Signed   By: Gerome Samavid  Williams III M.D   On: 12/14/2020 16:34   CT Head Wo Contrast  Result Date: 12/11/2020 CLINICAL DATA:  Mental status change. EXAM: CT HEAD WITHOUT CONTRAST TECHNIQUE: Contiguous axial images were obtained from the base of the skull through the vertex without intravenous contrast. COMPARISON:  July 08, 2020 FINDINGS: Brain: Similar mild age related global parenchymal volume loss with ex  vacuo dilatation of the ventricular system. Stable burden of mild-to-moderate chronic ischemic microvascular white matter disease. Mineralization of bilateral basal ganglia. No evidence of acute infarction, hemorrhage, hydrocephalus, extra-axial collection or mass lesion/mass effect. Vascular: No hyperdense vessel. Atherosclerotic calcifications of the internal carotid arteries at the skull base. Skull: Hyperostosis frontalis. Negative for fracture or focal lesion. Sinuses/Orbits: The visualized portions of the paranasal sinuses and mastoid air cells are predominantly clear. Orbits are grossly unremarkable. Other: None IMPRESSION: 1. No acute intracranial findings. 2. Stable age related global parenchymal volume loss and chronic ischemic microvascular white matter disease. Electronically Signed   By: Maudry MayhewJeffrey  Waltz MD   On: 12/11/2020 19:52   (Echo, Carotid, EGD, Colonoscopy, ERCP)    Subjective: Patient is seen and examined.  No overnight events.  Early morning, it was hard to wake her up and she will just say yes or no but remained sleepy. Later today, she was helped by therapist to try to sit at the edge of the bed and she took her medications. Cannot get much information from her.  She looks comfortable.   Discharge Exam: Vitals:   12/17/20 0432 12/17/20 0821  BP: 122/67 (!) 154/84  Pulse: 70 69  Resp: 16 16  Temp: 100.2 F (37.9 C) 100.3 F (37.9 C)  SpO2: 99% 100%  Vitals:   12/16/20 1542 12/16/20 2034 12/17/20 0432 12/17/20 0821  BP: (!) 120/57 (!) 144/76 122/67 (!) 154/84  Pulse: 91 68 70 69  Resp: Temp:  99.5 F (37.5 C) 100.2 F (37.9 C) 100.3 F (37.9 C)  TempSrc:  Axillary Axillary Axillary  SpO2: 100% 99% 99% 100%  Weight:      Height:        General: Pt is sleepy but not in any distress. She is barely alert, not oriented. Follows simple commands with difficulties. Cardiovascular: RRR, S1/S2 +, no rubs, no gallops Respiratory: CTA bilaterally, no  wheezing, no rhonchi, she is on room air. Abdominal: Soft, NT, ND, bowel sounds + Extremities: no edema, no cyanosis    The results of significant diagnostics from this hospitalization (including imaging, microbiology, ancillary and laboratory) are listed below for reference.     Microbiology: Recent Results (from the past 240 hour(s))  Resp Panel by RT-PCR (Flu A&B, Covid) Nasopharyngeal Swab     Status: Abnormal   Collection Time: 12/12/20  2:00 AM   Specimen: Nasopharyngeal Swab; Nasopharyngeal(NP) swabs in vial transport medium  Result Value Ref Range Status   SARS Coronavirus 2 by RT PCR POSITIVE (A) NEGATIVE Final    Comment: RESULT CALLED TO, READ BACK BY AND VERIFIED WITH: RN TABITHA AT 0345 12/12/20 CRUICKSHANK A (NOTE) SARS-CoV-2 target nucleic acids are DETECTED.  The SARS-CoV-2 RNA is generally detectable in upper respiratory specimens during the acute phase of infection. Positive results are indicative of the presence of the identified virus, but do not rule out bacterial infection or co-infection with other pathogens not detected by the test. Clinical correlation with patient history and other diagnostic information is necessary to determine patient infection status. The expected result is Negative.  Fact Sheet for Patients: BloggerCourse.com  Fact Sheet for Healthcare Providers: SeriousBroker.it  This test is not yet approved or cleared by the [place] FDA and  has been authorized for detection and/or diagnosis of SARS-CoV-2 by FDA under an Emergency Use Authorization (EUA).  This EUA will remain in effect (meaning this test  can be used) for the duration of  the COVID-19 declaration under Section 564(b)(1) of the Act, 21 U.S.C. section 360bbb-3(b)(1), unless the authorization is terminated or revoked sooner.     Influenza A by PCR NEGATIVE NEGATIVE Final   Influenza B by PCR NEGATIVE NEGATIVE Final     Comment: (NOTE) The Xpert Xpress SARS-CoV-2/FLU/RSV plus assay is intended as an aid in the diagnosis of influenza from Nasopharyngeal swab specimens and should not be used as a sole basis for treatment. Nasal washings and aspirates are unacceptable for Xpert Xpress SARS-CoV-2/FLU/RSV testing.  Fact Sheet for Patients: BloggerCourse.com  Fact Sheet for Healthcare Providers: SeriousBroker.it  This test is not yet approved or cleared by the [place] FDA and has been authorized for detection and/or diagnosis of SARS-CoV-2 by FDA under an Emergency Use Authorization (EUA). This EUA will remain in effect (meaning this test can be used) for the duration of the COVID-19 declaration under Section 564(b)(1) of the Act, 21 U.S.C. section 360bbb-3(b)(1), unless the authorization is terminated or revoked.  Performed at [facility], [address], [place], [place] [address]      Labs: BNP (last 3 results) Recent Labs    12/11/20 2347  BNP 178.5*   Basic Metabolic Panel: Recent Labs  Lab 12/13/20 0245 12/14/20 0335 12/15/20 0336 12/16/20 0335 12/17/20 0327  NA 138 137 141  138 140  K 3.3* 3.3* 3.6 3.7 3.9  CL 101 102 103 102 98  CO2 30 28 30 30  32  GLUCOSE 72 90 100* 105* 98  BUN 12 21 20 14 19   CREATININE 0.63 0.64 0.72 0.51 0.95  CALCIUM 8.7* 8.2* 8.9 8.9 9.2  MG 1.9 1.9 1.9 1.8 1.8   Liver Function Tests: Recent Labs  Lab 12/13/20 0245 12/14/20 0335 12/15/20 0336 12/16/20 0335 12/17/20 0327  AST 27 29 28  37 34  ALT 20 22 22 28 29   ALKPHOS 58 57 55 66 61  BILITOT 0.5 0.5 0.4 0.3 0.5  PROT 6.4* 5.6* 5.6* 5.9* 6.0*  ALBUMIN 3.3* 2.8* 2.9* 2.9* 3.2*   No results for input(s): LIPASE, AMYLASE in the last 168 hours. No results for input(s): AMMONIA in the last 168 hours. CBC: Recent Labs  Lab 12/13/20 0245 12/14/20 0335 12/15/20 0336 12/16/20 0335 12/17/20 0327  WBC 7.8 6.3 6.2 5.9 6.2   NEUTROABS 4.6 3.2 3.3 2.5 2.7  HGB 10.0* 8.9* 8.9* 10.0* 10.1*  HCT 32.1* 28.5* 28.3* 32.1* 32.0*  MCV 77.9* 78.5* 78.6* 79.3* 78.0*  PLT 227 213 247 279 332   Cardiac Enzymes: No results for input(s): CKTOTAL, CKMB, CKMBINDEX, TROPONINI in the last 168 hours. BNP: Invalid input(s): POCBNP CBG: Recent Labs  Lab 12/16/20 1243 12/16/20 1702 12/16/20 2030 12/17/20 0720 12/17/20 1106  GLUCAP 201* 150* 96 96 106*   D-Dimer Recent Labs    12/16/20 0335 12/17/20 0327  DDIMER 0.52* 1.47*   Hgb A1c No results for input(s): HGBA1C in the last 72 hours. Lipid Profile No results for input(s): CHOL, HDL, LDLCALC, TRIG, CHOLHDL, LDLDIRECT in the last 72 hours. Thyroid function studies No results for input(s): TSH, T4TOTAL, T3FREE, THYROIDAB in the last 72 hours.  Invalid input(s): FREET3 Anemia work up No results for input(s): VITAMINB12, FOLATE, FERRITIN, TIBC, IRON, RETICCTPCT in the last 72 hours. Urinalysis    Component Value Date/Time   COLORURINE YELLOW 07/08/2020 1644   APPEARANCEUR CLEAR 07/08/2020 1644   APPEARANCEUR Hazy 02/25/2013 0622   LABSPEC 1.016 07/08/2020 1644   LABSPEC 1.018 02/25/2013 0622   PHURINE 5.0 07/08/2020 1644   GLUCOSEU NEGATIVE 07/08/2020 1644   GLUCOSEU 150 mg/dL 09/05/2020 02/27/2013   HGBUR SMALL (A) 07/08/2020 1644   BILIRUBINUR NEGATIVE 07/08/2020 1644   BILIRUBINUR Negative 02/25/2013 0622   KETONESUR NEGATIVE 07/08/2020 1644   PROTEINUR NEGATIVE 07/08/2020 1644   UROBILINOGEN 0.2 04/23/2014 1605   NITRITE NEGATIVE 07/08/2020 1644   LEUKOCYTESUR NEGATIVE 07/08/2020 1644   LEUKOCYTESUR Trace 02/25/2013 0622   Sepsis Labs Invalid input(s): PROCALCITONIN,  WBC,  LACTICIDVEN Microbiology Recent Results (from the past 240 hour(s))  Resp Panel by RT-PCR (Flu A&B, Covid) Nasopharyngeal Swab     Status: Abnormal   Collection Time: 12/12/20  2:00 AM   Specimen: Nasopharyngeal Swab; Nasopharyngeal(NP) swabs in vial transport medium  Result  Value Ref Range Status   SARS Coronavirus 2 by RT PCR POSITIVE (A) NEGATIVE Final    Comment: RESULT CALLED TO, READ BACK BY AND VERIFIED WITH: RN TABITHA AT 0345 12/12/20 CRUICKSHANK A (NOTE) SARS-CoV-2 target nucleic acids are DETECTED.  The SARS-CoV-2 RNA is generally detectable in upper respiratory specimens during the acute phase of infection. Positive results are indicative of the presence of the identified virus, but do not rule out bacterial infection or co-infection with other pathogens not detected by the test. Clinical correlation with patient history and other diagnostic information is necessary to determine patient infection status.  The expected result is Negative.  Fact Sheet for Patients: BloggerCourse.com  Fact Sheet for Healthcare Providers: SeriousBroker.it  This test is not yet approved or cleared by the [place] FDA and  has been authorized for detection and/or diagnosis of SARS-CoV-2 by FDA under an Emergency Use Authorization (EUA).  This EUA will remain in effect (meaning this test  can be used) for the duration of  the COVID-19 declaration under Section 564(b)(1) of the Act, 21 U.S.C. section 360bbb-3(b)(1), unless the authorization is terminated or revoked sooner.     Influenza A by PCR NEGATIVE NEGATIVE Final   Influenza B by PCR NEGATIVE NEGATIVE Final    Comment: (NOTE) The Xpert Xpress SARS-CoV-2/FLU/RSV plus assay is intended as an aid in the diagnosis of influenza from Nasopharyngeal swab specimens and should not be used as a sole basis for treatment. Nasal washings and aspirates are unacceptable for Xpert Xpress SARS-CoV-2/FLU/RSV testing.  Fact Sheet for Patients: BloggerCourse.com  Fact Sheet for Healthcare Providers: SeriousBroker.it  This test is not yet approved or cleared by the [place] FDA and has been authorized for detection  and/or diagnosis of SARS-CoV-2 by FDA under an Emergency Use Authorization (EUA). This EUA will remain in effect (meaning this test can be used) for the duration of the COVID-19 declaration under Section 564(b)(1) of the Act, 21 U.S.C. section 360bbb-3(b)(1), unless the authorization is terminated or revoked.  Performed at [facility], [address], [place], [place] [address]      Time coordinating discharge:  40 minutes  SIGNED:   Dorcas Carrow, MD  [facility] 12/17/2020, 11:57 AM

## 2020-12-17 NOTE — Progress Notes (Signed)
Gerri Spore Long Rm 1428 Civil engineer, contracting [facility]) Hospital Liaison note:  Notified by Davis Gourd, LCSW of request for [facility] Palliative Care services. Will continue to follow for disposition.  Please call with any outpatient palliative questions or concerns.  Thank you for the opportunity to participate in this patient's care.  Thank you, Abran Cantor, LPN [facility] Liaison [phone]

## 2020-12-17 NOTE — TOC Transition Note (Addendum)
Transition of Care [facility]) - CM/SW Discharge Note   Patient Details  Name: Margaret Hernandez MRN: [id number] Date of Birth: July 27, 1929  Transition of Care [facility]) CM/SW Contact:  Darleene Cleaver, LCSW Phone Number: 12/17/2020, 12:27 PM   Clinical Narrative:     Patient to be d/c'ed today to [facility].  CSW spoke to [facility] they can receive patient at 2pm.  CSW updated [facility] social worker Judeth Cornfield [phone] they will transport patient at 2pm.  Patient and family agreeable to plans will transport via [employer] RN to call report to [phone].  CSW attempted to contact patient's daughter [name] to inform her of patient discharge, CSW left message on voice mail.  Patient to be followed by palliative outpatient at SNF.  CSW made referral to [facility] palliative services.  Final next level of care: Skilled Nursing Facility Barriers to Discharge: Barriers Resolved   Patient Goals and CMS Choice Patient states their goals for this hospitalization and ongoing recovery are:: To go to SNF then return back home CMS Medicare.gov Compare Post Acute Care list provided to:: Patient Represenative (must comment) Choice offered to / list presented to : Adult Children  Discharge Placement  [facility] SNF   Existing PASRR number confirmed : 12/16/20          Patient chooses bed at: [facility] Patient to be transferred to facility by: [facility] transportation Name of family member notified: Left message on daughter [name]'s voice mail. Patient and family notified of of transfer: 12/17/20  Discharge Plan and Services  Patient planning to discharge to SNF.                                   Social Determinants of Health (SDOH) Interventions     Readmission Risk Interventions No flowsheet data found.

## 2021-05-02 ENCOUNTER — Emergency Department (HOSPITAL_COMMUNITY)
Admission: EM | Admit: 2021-05-02 | Discharge: 2021-05-03 | Disposition: A | Discharge status: Home / Self Care | Payer: Medicare (Managed Care) | Attending: Emergency Medicine | Admitting: Emergency Medicine

## 2021-05-02 ENCOUNTER — Emergency Department (HOSPITAL_COMMUNITY): Payer: Medicare (Managed Care)

## 2021-05-02 ENCOUNTER — Encounter (HOSPITAL_COMMUNITY): Payer: Self-pay | Admitting: Emergency Medicine

## 2021-05-02 ENCOUNTER — Other Ambulatory Visit: Payer: Self-pay

## 2021-05-02 DIAGNOSIS — F0390 Unspecified dementia without behavioral disturbance: Secondary | ICD-10-CM | POA: Diagnosis not present

## 2021-05-02 DIAGNOSIS — I10 Essential (primary) hypertension: Secondary | ICD-10-CM | POA: Diagnosis not present

## 2021-05-02 DIAGNOSIS — R404 Transient alteration of awareness: Secondary | ICD-10-CM

## 2021-05-02 DIAGNOSIS — Z79899 Other long term (current) drug therapy: Secondary | ICD-10-CM | POA: Diagnosis not present

## 2021-05-02 DIAGNOSIS — R4182 Altered mental status, unspecified: Secondary | ICD-10-CM | POA: Diagnosis present

## 2021-05-02 LAB — URINALYSIS, ROUTINE W REFLEX MICROSCOPIC
Bilirubin Urine: NEGATIVE
Glucose, UA: NEGATIVE mg/dL
Hgb urine dipstick: NEGATIVE
Ketones, ur: NEGATIVE mg/dL
Leukocytes,Ua: NEGATIVE
Nitrite: NEGATIVE
Protein, ur: NEGATIVE mg/dL
Specific Gravity, Urine: 1.011 (ref 1.005–1.030)
pH: 8 (ref 5.0–8.0)

## 2021-05-02 LAB — CBC
HCT: 36.5 % (ref 36.0–46.0)
Hemoglobin: 11.3 g/dL — ABNORMAL LOW (ref 12.0–15.0)
MCH: 24.6 pg — ABNORMAL LOW (ref 26.0–34.0)
MCHC: 31 g/dL (ref 30.0–36.0)
MCV: 79.3 fL — ABNORMAL LOW (ref 80.0–100.0)
Platelets: 200 10*3/uL (ref 150–400)
RBC: 4.6 MIL/uL (ref 3.87–5.11)
RDW: 13.4 % (ref 11.5–15.5)
WBC: 6.4 10*3/uL (ref 4.0–10.5)
nRBC: 0 % (ref 0.0–0.2)

## 2021-05-02 LAB — COMPREHENSIVE METABOLIC PANEL
ALT: 18 U/L (ref 0–44)
AST: 18 U/L (ref 15–41)
Albumin: 3.7 g/dL (ref 3.5–5.0)
Alkaline Phosphatase: 139 U/L — ABNORMAL HIGH (ref 38–126)
Anion gap: 7 (ref 5–15)
BUN: 23 mg/dL (ref 8–23)
CO2: 28 mmol/L (ref 22–32)
Calcium: 9.7 mg/dL (ref 8.9–10.3)
Chloride: 105 mmol/L (ref 98–111)
Creatinine, Ser: 0.96 mg/dL (ref 0.44–1.00)
GFR, Estimated: 57 mL/min — ABNORMAL LOW (ref >60)
Glucose, Bld: 117 mg/dL — ABNORMAL HIGH (ref 70–99)
Potassium: 4.4 mmol/L (ref 3.5–5.1)
Sodium: 140 mmol/L (ref 135–145)
Total Bilirubin: 0.4 mg/dL (ref 0.3–1.2)
Total Protein: 6.6 g/dL (ref 6.5–8.1)

## 2021-05-02 LAB — LACTIC ACID, PLASMA: Lactic Acid, Venous: 1.6 mmol/L (ref 0.5–1.9)

## 2021-05-02 NOTE — ED Notes (Signed)
Attempted to call report to [facility] facility at [phone]; call was transferred to pt's unit where there was then no answer after numerous rings and several minutes on hold.

## 2021-05-02 NOTE — ED Provider Notes (Signed)
[facility] ([place]) EMERGENCY DEPARTMENT Provider Note   CSN: [id number] Arrival date & time: 05/02/21  1554     History Chief Complaint  Patient presents with   Altered Mental Status    Margaret Hernandez is a 87 y.o. female.  HPI Patient presents with altered mental status, level 5 caveat. She does have dementia, which also limits history.  She is eventually joined by her daughter.  Daughter notes that yesterday the patient was in her usual state of health, participatory, verbal, able to transfer to wheelchair.  However, today, the patient was found with none of these characteristics.  No reported change in medication, diet, no reported fall, trauma.    Past Medical History:  Diagnosis Date   Dementia (HCC)    Diabetes mellitus without complication (HCC)    Hypertension     Patient Active Problem List   Diagnosis Date Noted   COVID-19 virus infection 12/12/2020   Mixed diabetic hyperlipidemia associated with type 2 diabetes mellitus (HCC) 12/12/2020   Acute metabolic encephalopathy 12/12/2020   Severe protein-calorie malnutrition (HCC) 12/12/2020   AKI (acute kidney injury) (HCC) 07/08/2020   Physical deconditioning 07/08/2020   Lumbar stenosis 07/08/2020   Hypokalemia 07/08/2020   Microcytic anemia 07/08/2020   Awareness alteration, transient 08/28/2014   Seizure disorder (HCC) 08/28/2014   Fall due to seizure [facility])    Seizure (HCC)    Status epilepticus (HCC) 04/23/2014   Status epilepticus, generalized convulsive (HCC) 04/23/2014   Diabetes type 2, uncontrolled 12/24/2013   Dementia with behavioral disturbance (HCC) 12/24/2013   Diabetes type 2, controlled (HCC) 09/12/2013   GERD without esophagitis 09/12/2013   Hyperlipemia 09/12/2013   Arthritis 10/23/2010   HBP (high blood pressure) 10/23/2010    Past Surgical History:  Procedure Laterality Date   ABDOMINAL HYSTERECTOMY     SHOULDER SURGERY       OB History   No obstetric history on file.      Family History  Problem Relation Age of Onset   Hypertension Mother    Hypertension Father     Social History   Tobacco Use   Smoking status: Never   Smokeless tobacco: Never  Vaping Use   Vaping Use: Never used  Substance Use Topics   Alcohol use: No   Drug use: No    Home Medications Prior to Admission medications   Medication Sig Start Date End Date Taking? Authorizing Provider  acetaminophen (TYLENOL) 325 MG tablet Take 650 mg by mouth every 6 (six) hours as needed for mild pain or headache.    [provider]  amLODipine (NORVASC) 5 MG tablet Take 5 mg by mouth daily.    [provider]  atorvastatin (LIPITOR) 80 MG tablet Take 80 mg by mouth daily.    [provider]  calcium citrate (CALCITRATE - DOSED IN MG ELEMENTAL CALCIUM) 950 MG tablet Take 200 mg of elemental calcium by mouth daily.    [provider]  cholecalciferol (VITAMIN D3) 25 MCG (1000 UNIT) tablet Take 1,000 Units by mouth daily.    [provider]  donepezil (ARICEPT) 10 MG tablet Take 10 mg by mouth at bedtime.    [provider]  fexofenadine (ALLEGRA) 180 MG tablet Take 180 mg by mouth daily.    [provider]  lacosamide 150 MG TABS Take 1 tablet (150 mg total) by mouth 2 (two) times daily. 07/11/20   Glade Lloyd, MD  levETIRAcetam (KEPPRA) 750 MG tablet Take 1 tablet (750  mg total) by mouth 2 (two) times daily. 07/11/20   Glade Lloyd, MD  memantine (NAMENDA) 10 MG tablet Take 10 mg by mouth 2 (two) times daily.    [provider]  senna (SENOKOT) 8.6 MG TABS tablet Take 1 tablet by mouth 2 (two) times daily.    [provider]    Allergies    Patient has no known allergies.  Review of Systems   Review of Systems  Unable to perform ROS: Dementia   Physical Exam Updated Vital Signs BP (!) 153/77   Pulse 68   Temp 97.9 F (36.6 C) (Oral)   Resp 15   SpO2 100%   Physical Exam Vitals and nursing note  reviewed.  Constitutional:      Appearance: She is well-developed. She is ill-appearing.     Comments: Withdrawn elderly female essentially nonverbal  HENT:     Head: Normocephalic and atraumatic.  Eyes:     Conjunctiva/sclera: Conjunctivae normal.  Cardiovascular:     Rate and Rhythm: Normal rate and regular rhythm.  Pulmonary:     Effort: Pulmonary effort is normal. No respiratory distress.     Breath sounds: Normal breath sounds. No stridor.  Abdominal:     General: There is no distension.  Skin:    General: Skin is warm and dry.  Neurological:     Comments: Non participatory  Psychiatric:        Cognition and Memory: Cognition is impaired. Memory is impaired.    ED Results / Procedures / Treatments   Labs (all labs ordered are listed, but only abnormal results are displayed) Labs Reviewed  COMPREHENSIVE METABOLIC PANEL - Abnormal; Notable for the following components:      Result Value   Glucose, Bld 117 (*)    Alkaline Phosphatase 139 (*)    GFR, Estimated 57 (*)    All other components within normal limits  CBC - Abnormal; Notable for the following components:   Hemoglobin 11.3 (*)    MCV 79.3 (*)    MCH 24.6 (*)    All other components within normal limits  URINALYSIS, ROUTINE W REFLEX MICROSCOPIC - Abnormal; Notable for the following components:   Color, Urine STRAW (*)    All other components within normal limits  LACTIC ACID, PLASMA  LACTIC ACID, PLASMA    EKG EKG Interpretation  Date/Time:  Saturday May 02 2021 16:16:48 EST Ventricular Rate:  80 PR Interval:  223 QRS Duration: 123 QT Interval:  386 QTC Calculation: 446 R Axis:   4 Text Interpretation: Sinus rhythm Multiple ventricular premature complexes Prolonged PR interval Left bundle branch block No significant change since last tracing Abnormal ECG Confirmed by Gerhard Munch [phone]) on 05/02/2021 5:01:55 PM  Radiology DG Chest 2 View  Result Date: 05/02/2021 CLINICAL DATA:  Altered  mental status. EXAM: CHEST - 2 VIEW COMPARISON:  December 11, 2020 FINDINGS: Calcific atherosclerotic disease and tortuosity of the aorta. Cardiomediastinal silhouette is normal. Mediastinal contours appear intact. There is no evidence of focal airspace consolidation, pleural effusion or pneumothorax. Osseous structures are without acute abnormality. Soft tissues are grossly normal. IMPRESSION: No active cardiopulmonary disease. Electronically Signed   By: Ted Mcalpine M.D.   On: 05/02/2021 17:29   CT Head Wo Contrast  Result Date: 05/02/2021 CLINICAL DATA:  Mental status change, unknown cause. Dementia, decreased responsiveness EXAM: CT HEAD WITHOUT CONTRAST TECHNIQUE: Contiguous axial images were obtained from the base of the skull through the vertex without intravenous contrast. COMPARISON:  12/11/2020 FINDINGS: Brain: Normal anatomic configuration. Parenchymal volume loss is commensurate with the patient's age. Stable mild periventricular white matter changes are present likely reflecting the sequela of small vessel ischemia. No abnormal intra or extra-axial mass lesion or fluid collection. No abnormal mass effect or midline shift. No evidence of acute intracranial hemorrhage or infarct. Ventricular size is normal. Cerebellum unremarkable. Vascular: No asymmetric hyperdense vasculature at the skull base. Skull: Intact Sinuses/Orbits: Paranasal sinuses are clear. Ocular lenses have been removed. Orbits are otherwise unremarkable. Other: Mastoid air cells and middle ear cavities are clear. IMPRESSION: No acute intracranial abnormality. Stable senescent change. Electronically Signed   By: Helyn Numbers M.D.   On: 05/02/2021 18:37    Procedures Procedures   Medications Ordered in ED Medications - No data to display  ED Course  I have reviewed the triage vital signs and the nursing notes.  Pertinent labs & imaging results that were available during my care of the patient were reviewed by me and  considered in my medical decision making (see chart for details).   Update: Daughter at bedside   7:31 PM Patient awake, alert, stating that she feels fine, daughter notes the patient is interacting in a normal manner for her.  We discussed possibilities including TIA versus aggression of dementia versus episode of confusion.  Here no evidence for bacteremia, sepsis, no obvious infection, low suspicion for seizure, though this is another possibility. Daughter amenable to patient return to her nursing facility given her advanced dementia, enrollment in palliative care. MDM Rules/Calculators/A&P  MDM Number of Diagnoses or Management Options Transient alteration of awareness: new, needed workup   Amount and/or Complexity of Data Reviewed Clinical lab tests: reviewed and ordered Tests in the radiology section of CPT: ordered and reviewed Tests in the medicine section of CPT: reviewed and ordered Decide to obtain previous medical records or to obtain history from someone other than the patient: yes Obtain history from someone other than the patient: yes Review and summarize past medical records: yes Independent visualization of images, tracings, or specimens: yes  Risk of Complications, Morbidity, and/or Mortality Presenting problems: high Diagnostic procedures: high Management options: high  Critical Care Total time providing critical care: < 30 minutes  Patient Progress Patient progress: improved   Final Clinical Impression(s) / ED Diagnoses Final diagnoses:  Transient alteration of awareness     Gerhard Munch, MD 05/02/21 1932

## 2021-05-02 NOTE — ED Triage Notes (Signed)
BIB EMS from [facility].  Hx of dementia.  Usually talkative and prefers to sit in her wheelchair during the day.  Pt did not want to get out of bed today and is minimally responsive. Pt does grimace as if in pain but trauma assessment by EMS revealed no concerns.

## 2021-05-02 NOTE — Discharge Instructions (Signed)
As discussed, your evaluation today has been largely reassuring.  But, it is important that you monitor your condition carefully, and do not hesitate to return to the ED if you develop new, or concerning changes in your condition. ? ?Otherwise, please follow-up with your physician for appropriate ongoing care. ? ?

## 2021-05-02 NOTE — ED Notes (Signed)
[facility] WAS CALLED  

## 2021-05-03 NOTE — ED Notes (Signed)
Patient is resting comfortably. 

## 2021-05-03 NOTE — ED Notes (Signed)
Patient is resting comfortably and sleeping.  SR upx2 on stretcher and wheels locked. Awaiting transport via [facility]

## 2021-08-29 DEATH — deceased
